# Patient Record
Sex: Male | Born: 1955
Health system: Southern US, Community
[De-identification: ages and names within clinical notes are randomized; demographics above are authoritative.]

## PROBLEM LIST (undated history)

## (undated) DIAGNOSIS — I10 Essential (primary) hypertension: Secondary | ICD-10-CM

## (undated) DIAGNOSIS — K37 Unspecified appendicitis: Secondary | ICD-10-CM

## (undated) DIAGNOSIS — K219 Gastro-esophageal reflux disease without esophagitis: Secondary | ICD-10-CM

## (undated) DIAGNOSIS — J189 Pneumonia, unspecified organism: Secondary | ICD-10-CM

## (undated) DIAGNOSIS — M1990 Unspecified osteoarthritis, unspecified site: Secondary | ICD-10-CM

## (undated) DIAGNOSIS — Z889 Allergy status to unspecified drugs, medicaments and biological substances status: Secondary | ICD-10-CM

## (undated) DIAGNOSIS — R0600 Dyspnea, unspecified: Secondary | ICD-10-CM

## (undated) DIAGNOSIS — E7800 Pure hypercholesterolemia, unspecified: Secondary | ICD-10-CM

## (undated) DIAGNOSIS — D490 Neoplasm of unspecified behavior of digestive system: Secondary | ICD-10-CM

## (undated) DIAGNOSIS — D649 Anemia, unspecified: Secondary | ICD-10-CM

## (undated) DIAGNOSIS — F32A Depression, unspecified: Secondary | ICD-10-CM

## (undated) DIAGNOSIS — K922 Gastrointestinal hemorrhage, unspecified: Secondary | ICD-10-CM

## (undated) DIAGNOSIS — F419 Anxiety disorder, unspecified: Secondary | ICD-10-CM

## (undated) DIAGNOSIS — K589 Irritable bowel syndrome without diarrhea: Secondary | ICD-10-CM

## (undated) DIAGNOSIS — G4730 Sleep apnea, unspecified: Secondary | ICD-10-CM

## (undated) DIAGNOSIS — F329 Major depressive disorder, single episode, unspecified: Secondary | ICD-10-CM

## (undated) DIAGNOSIS — Z8719 Personal history of other diseases of the digestive system: Secondary | ICD-10-CM

## (undated) HISTORY — DX: Pure hypercholesterolemia, unspecified: E78.00

## (undated) HISTORY — DX: Sleep apnea, unspecified: G47.30

## (undated) HISTORY — PX: TOTAL SHOULDER ARTHROPLASTY: SHX126

## (undated) HISTORY — PX: PROSTATE BIOPSY: SHX241

## (undated) HISTORY — PX: COLONOSCOPY: SHX174

## (undated) HISTORY — DX: Gastrointestinal hemorrhage, unspecified: K92.2

## (undated) HISTORY — DX: Essential (primary) hypertension: I10

## (undated) HISTORY — DX: Neoplasm of unspecified behavior of digestive system: D49.0

## (undated) HISTORY — PX: KNEE ARTHROPLASTY: SHX992

## (undated) HISTORY — DX: Gastro-esophageal reflux disease without esophagitis: K21.9

## (undated) HISTORY — PX: TONSILLECTOMY: SUR1361

## (undated) HISTORY — PX: GASTRECTOMY: SHX58

## (undated) HISTORY — DX: Unspecified appendicitis: K37

---

## 1997-08-02 ENCOUNTER — Emergency Department (HOSPITAL_COMMUNITY): Admission: EM | Admit: 1997-08-02 | Discharge: 1997-08-02 | Payer: Self-pay | Admitting: Emergency Medicine

## 2002-04-11 ENCOUNTER — Ambulatory Visit (HOSPITAL_COMMUNITY): Admission: RE | Admit: 2002-04-11 | Discharge: 2002-04-11 | Payer: Self-pay | Admitting: Internal Medicine

## 2002-04-11 ENCOUNTER — Encounter: Payer: Self-pay | Admitting: Internal Medicine

## 2004-02-26 ENCOUNTER — Ambulatory Visit: Payer: Self-pay | Admitting: Family Medicine

## 2004-03-10 ENCOUNTER — Ambulatory Visit: Payer: Self-pay | Admitting: Family Medicine

## 2004-03-11 ENCOUNTER — Ambulatory Visit: Payer: Self-pay | Admitting: *Deleted

## 2005-05-21 ENCOUNTER — Inpatient Hospital Stay (HOSPITAL_COMMUNITY): Admission: EM | Admit: 2005-05-21 | Discharge: 2005-05-26 | Payer: Self-pay | Admitting: Emergency Medicine

## 2007-03-01 DIAGNOSIS — D649 Anemia, unspecified: Secondary | ICD-10-CM

## 2007-03-01 HISTORY — DX: Anemia, unspecified: D64.9

## 2007-03-01 HISTORY — PX: INCISE AND DRAIN ABCESS: PRO64

## 2007-05-28 ENCOUNTER — Emergency Department (HOSPITAL_COMMUNITY): Admission: EM | Admit: 2007-05-28 | Discharge: 2007-05-28 | Payer: Self-pay | Admitting: Emergency Medicine

## 2007-11-29 ENCOUNTER — Emergency Department (HOSPITAL_COMMUNITY): Admission: EM | Admit: 2007-11-29 | Discharge: 2007-11-29 | Payer: Self-pay | Admitting: Emergency Medicine

## 2008-06-02 ENCOUNTER — Emergency Department (HOSPITAL_COMMUNITY): Admission: EM | Admit: 2008-06-02 | Discharge: 2008-06-02 | Payer: Self-pay | Admitting: Family Medicine

## 2008-11-18 ENCOUNTER — Emergency Department (HOSPITAL_COMMUNITY): Admission: EM | Admit: 2008-11-18 | Discharge: 2008-11-18 | Payer: Self-pay | Admitting: Emergency Medicine

## 2010-07-16 NOTE — H&P (Signed)
NAME:  John Keller, John Keller NO.:  [id number]   MEDICAL RECORD NO.:  [id number]          PATIENT TYPE:  INP   LOCATION:  5735                         FACILITY:  [facility]   PHYSICIAN:  Velora Heckler, MD      DATE OF BIRTH:  1955/12/22   DATE OF ADMISSION:  05/21/2005  DATE OF DISCHARGE:                                HISTORY & PHYSICAL   REFERRING PHYSICIAN:  Dr. Linwood Dibbles.   REASON FOR ADMISSION:  Cellulitis left chest wall, left axilla and left  upper extremity with full-thickness necrosis of skin and underlying multiple  abscesses.   HISTORY OF PRESENT ILLNESS:  The patient is a 55 year old black male from  [place], [place] who presents to the emergency department  accompanied by his sister.  The patient has a two-week history of  progressive cellulitis of the left chest, left axilla and left upper arm.  He was initially seen and managed at [facility].  Radiographs  were taken.  The patient was prescribed pain medication.  Symptoms worsened  and the patient presented to the emergency department for assessment.  General surgery is now called for management.   PAST MEDICAL HISTORY:  1.  Status post right hand surgery.  2.  Status post wisdom tooth extraction.   MEDICATIONS:  Pain medications as prescribed by [facility].   ALLERGIES:  None known.   SOCIAL HISTORY:  The patient is single.  He lives alone in [place].  He  does not smoke.  He does not drink alcohol.  He works for the [employer]  He is accompanied by his sister.   ALLERGIES:  None known.   REVIEW OF SYSTEMS:  A 15-system review without significant positives.   EXAM:  In general, a 55 year old well-developed, well-nourished black male  on a stretcher in the emergency department.  Temperature 102.3, pulse 118,  respirations 22, blood pressure 121/88.  HEENT shows him to be  normocephalic, atraumatic.  Sclerae clear.  Conjunctivae clear.  Pupils  equal  and reactive.  Dentition fair.  Mucous membranes moist.  The patient  has a partial plate.  Neck is supple, nontender without masses.  No  lymphadenopathy.  There are no supraclavicular masses.  There is no  tenderness.  Lungs are clear to auscultation bilaterally without rales,  rhonchi or wheeze.  Cardiac exam shows regular rate and rhythm.  Peripheral  pulses are full.  Examination of the chest wall shows a marked violaceous  erythema of the left pectoralis muscle with marked soft tissue swelling,  fluctuance and tenderness.  Skin is markedly indurated.  Axilla shows some  areas of full-thickness necrosis with purulent drainage.  There is marked  cellulitis extending from the axilla down the medial aspect of the left  upper arm.  There is full-thickness necrosis of skin overlying the left  biceps musculature.  There is induration and erythema down to the level of  the antecubital fossa.  There is limitation in range of motion of the left  upper extremity at the level of the shoulder secondary  to pain.  Cardiac  exam shows regular rate and rhythm without murmur.  Abdomen is soft,  nontender without distension.  Extremities are nontender without edema,  other than as described for the left upper extremity.  Neurologically, the  patient is alert and oriented.  Answers questions appropriately.  He has no  gross focal abnormalities.   LABORATORY STUDIES:  White count 17.9, hemoglobin 12.1, hematocrit 35.7%,  platelet count 380,000.  Differential shows 88% neutrophils, 6% lymphocytes,  5% monocytes.  Chest x-ray shows no acute disease by report.  EKG pending.   IMPRESSION:  Cellulitis with abscess and full-thickness skin necrosis  involving left chest wall, left axilla, left upper arm.   PLAN:  The patient will be admitted on the general surgical service at [facility].  He will be initiated with intravenous antibiotics.  The  patient will be taken urgently to the operating room  this evening for  debridement and drainage.  This procedure was discussed with the patient and  his sister at the bedside.  They understand and wish to proceed.      Velora Heckler, MD  Electronically Signed     TMG/MEDQ  D:  05/21/2005  T:  05/23/2005  Job:  [id number]

## 2010-07-16 NOTE — Op Note (Signed)
NAME:  John Keller, John Keller NO.:  [id number]   MEDICAL RECORD NO.:  [id number]          PATIENT TYPE:  INP   LOCATION:  1827                         FACILITY:  [facility]   PHYSICIAN:  Velora Heckler, MD      DATE OF BIRTH:  1956-01-31   DATE OF PROCEDURE:  05/21/2005  DATE OF DISCHARGE:                                 OPERATIVE REPORT   PREOPERATIVE DIAGNOSIS:  Cellulitis and abscesses left chest wall, left  axilla, left upper extremity.   POSTOPERATIVE DIAGNOSES:  Cellulitis and abscesses left chest wall, left  axilla, left upper extremity.   PROCEDURE:  1.  Incision and drainage of multiple abscesses left chest wall, left      axilla, left upper extremity with open packing.  2.  Full-thickness debridement of skin (3 x 5 cm left upper extremity, 2 x 2      cm left deltoid, 2 x 3 cm left axilla, 1 x 2 cm left chest wall).   SURGEON:  Velora Heckler, MD, FACS   ANESTHESIA:  General per Dr. Sharee Holster   ESTIMATED BLOOD LOSS:  200 ml.   PREPARATION:  Betadine.   COMPLICATIONS:  None.   INDICATIONS:  The patient is a 55 year old male presents to the emergency  department with 2-week history of progressive cellulitis, pain, swelling in  the left chest, left axilla, left upper extremity.  The patient was seen in  the emergency department.  He was noted to have an elevated white blood cell  count with a left shift.  He was febrile.  On exam he had necrotic skin  involving the left upper extremity, left axilla, left chest wall with a  large areas of fluctuance and induration and erythema.  The patient is now  brought to the operating room for drainage and debridement.   BODY OF REPORT:  The procedure was done in OR #[id number] at [facility].  The patient is brought to the operating room, placed in supine  position on the operating room table.  Following administration of general  anesthesia,  the patient is placed with the left arm out on an arm  board.  Chest wall, axilla and left upper extremity are prepped and draped in usual  strict aseptic fashion.  Using the electrocautery on cutting setting, the  necrotic skin from the upper left arm was debrided.  This measures 3 x 5 cm.  It is debrided full-thickness into the subcutaneous tissues.  A large  copious amount of green brown, purulent fluid which was quite thick  containing a large amount of debris is evacuated.  Aerobic and anaerobic  cultures were submitted to the laboratory.  Cavity is debrided down the  medial aspect of the left arm into the deltoid region of the left shoulder  and into the left axilla proper.  The area connects with other areas of  necrotic skin.  A 2 x 2 cm skin was debrided over the left deltoid.  A 2 x 3  cm area is debrided in the left axilla.  A 1 x 2 cm area was debrided off  the left chest wall.  Underlying abscess cavities were very extensive.  They  all communicate.  They tract both and behind and the anterior to the  pectoralis major muscles on the left chest wall.  Tissues were extremely  friable and there was moderate blood loss simply from drainage and  debridement.  No surgical bleeding is identified.  Wounds irrigated and  cleansed.  There are then packed with vaginal packing soaked with Betadine.  Three complete vaginal packs were placed in all wounds.  Good hemostasis was  noted.  4x4s, followed by ABD pads were secured with Hypafix tape for  absorbent dressings.  The patient is awakened from anesthesia and brought to  the recovery room in stable condition.  The patient tolerated the procedure  well.  The patient will require returned to the operating room for dressing  changes and further debridement and drainage.      Velora Heckler, MD  Electronically Signed     TMG/MEDQ  D:  05/21/2005  T:  05/24/2005  Job:  [id number]

## 2010-07-16 NOTE — Discharge Summary (Signed)
NAME:  GARRUS, GAUTHREAUX NO.:  [id number]   MEDICAL RECORD NO.:  [id number]          PATIENT TYPE:  INP   LOCATION:  5741                         FACILITY:  [facility]   PHYSICIAN:  Leonie Man, M.D.   DATE OF BIRTH:  1955/09/16   DATE OF ADMISSION:  05/21/2005  DATE OF DISCHARGE:  05/26/2005                                 DISCHARGE SUMMARY   ADDENDUM:  This is regarding the wound care.  In addition to packing with wet-to-dry  dressing as prescribed, prior to this being done the patient will have all  wounds irrigated with 1/3 peroxide to 2/3 normal saline with each dressing  change.      Allison L. Rennis Harding, N.P.      Leonie Man, M.D.  Electronically Signed    ALE/MEDQ  D:  05/26/2005  T:  05/27/2005  Job:  [id number]

## 2010-07-16 NOTE — Discharge Summary (Signed)
NAME:  John Keller, John Keller NO.:  [id number]   MEDICAL RECORD NO.:  [id number]          PATIENT TYPE:  INP   LOCATION:  5741                         FACILITY:  [facility]   PHYSICIAN:  Leonie Man, M.D.   DATE OF BIRTH:  03-Oct-1955   DATE OF ADMISSION:  05/21/2005  DATE OF DISCHARGE:  05/26/2005                                 DISCHARGE SUMMARY   ADMITTING PHYSICIAN:  Velora Heckler, M.D.   DISCHARGING PHYSICIAN:  Leonie Man, M.D.   CHIEF COMPLAINT AND REASON FOR ADMISSION:  Mr. Bourdon is a 55 year old male  patient who was brought to the emergency department by his sister.  He has a  two-week history of progressive cellulitis of the left chest, left axilla  and left upper arm.  He was initially managed through [facility].  X-rays were taken, and the patient was given pain medication.  Because of progression of symptoms, the patient presented to the ER for  assessment, and surgery has been called in to assist with management of  severe cellulitis with multiple skin abscesses including full-thickness  necrosis.   On exam, the patient was febrile with a temperature of 102.3.  Pulse was  118.  BP was stable and respirations were stable.  There was significant  soft tissue swelling with violaceous erythema of the left pectoralis region  with fluctuance and tenderness and induration.  Similar appearance to the  axilla with cellulitis extending down to the axilla and the upper left arm  with full-thickness necrosis of the skin overlying the left biceps  musculature.  Please refer to Dr. Ardine Eng admission history and physical  exam of the extremity for details.   The patient's admission white count was 17,900, neutrophils were 88%,  lymphocytes 6%, hemoglobin 12.1, hematocrit 35.7.  Chest x-ray showed no  acute process.  Initial EKG showed sinus tachycardia, right axis, otherwise  no acute ischemic changes.   ADMITTING DIAGNOSIS:  Cellulitis of the left  chest wall, axilla and left  upper extremity with full-thickness necrosis of skin and underlying multiple  abscesses.   HOSPITAL COURSE:  The patient was admitted on May 21, 2005 by Dr. Gerrit Friends  as noted.  He was also taken to the OR on the same day where he underwent  incision and debridement of multiple abscesses of the left chest wall, left  axilla and left upper extremity with open packing as well as full-thickness  debridement of skin in same areas.  Please refer to Dr. Ardine Eng operative  note for additional details.  The patient was started on vancomycin and  Primaxin and sent to the floor for recovery.   On postop day one the dressings were saturated with purulent drainage,  especially in the chest region.  White count was up to 19,000.  Temperature  max was 101.4.  He was continued on IV antibiotics and dressing changes were  continued.  On postop day two, the patient had pain in the left arm and  axilla with movement.  Continued with erythema.  White count was up to  19,600, hemoglobin  down to 10.  Primaxin and vancomycin were continued.  Wounds were changed.  Bases of the wounds were pink and granular.  Still  purulent drainage, especially from the mid chest as well as fibrin debris.  By postop day three, the patient's white count had decreased to 10,600,  hemoglobin 10.7.  Other labs were stable.  He was afebrile.  Wound care was  provided per myself.  A large amount of fibrin debris manually removed from  axilla wound, and thick brown-yellow purulent drainage and fibrin debris was  noted to be coming from the chest incision.  Again wound was repacked and  b.i.d. dressing changes were continued.   Subsequent wound cultures were positive for microaerophilic strep.  Antibiotics were changed to Augmentin.  By postop day five, the patient was  deemed appropriate for discharge home.  His blood cultures were negative.  At time of dictation, wound care has yet to be performed from  myself and Dr.  Lurene Shadow.  The patient is still having a significant amount of fibrinous and  purulent drainage coming from the chest wound but overall has improved.   In addition to the noted cellulitic and abscess changes for admission, the  patient was found to be somewhat anemic with hemoglobin in the range of 10  since admission.  Ferritin is elevated at 464.  Iron is normal at 58 with a  percent sat of 34.  TIBC is low at 171.  Fecal occult blood is negative.  MMA and RBC folate are pending at time of dictation.  Patient denies any  prior history of anemia.  I discussed this with the patient and informed him  that he needed to follow up with a family medical doctor in the next week or  two to discuss the anemia.   FINAL DISCHARGE DIAGNOSES:  1.  Cellulitis and abscesses of the left chest wall, left axilla and left      upper extremity.  2.  Status post incision and drainage of multiple abscesses of left chest      wall, left axilla and left upper extremity with open packing with full-      thickness debridement of skin in these same areas.  3.  New anemia.  4.  Leukocytosis, resolved.   DISCHARGE MEDICATIONS:  1.  Augmentin 875 mg b.i.d.  2.  Percocet 5/325 one to two every four hours as needed for pain.   Return to work after followup with Dr. Gerrit Friends.  He will tell the patient  when it is okay to return to work.   DIET:  No restrictions.   ACTIVITY:  Increase activity slowly.  May shower.  No driving for two weeks.  No lifting for four weeks.   WOUND CARE:  Will have home health RN come to the house to assist with  dressing changes.  This will be normal saline gauze, pack open areas of  wounds, cover with dry dressings.  This is to be done twice daily.   FOLLOWUP APPOINTMENTS:  1.  Again, the patient has been instructed to follow up with his family      doctor regarding the anemia or low blood count.  He needs to call to be      seen in one week. 2.  He is to follow up  with Dr. Gerrit Friends in the office on Monday, June 13, 2005 at 2:30 p.m.      Allison L. Rennis Harding, N.P.  Leonie Man, M.D.  Electronically Signed    ALE/MEDQ  D:  05/26/2005  T:  05/27/2005  Job:  [id number]   cc:   Velora Heckler, MD  [address] [place]  [place] [address]

## 2011-03-01 DIAGNOSIS — K922 Gastrointestinal hemorrhage, unspecified: Secondary | ICD-10-CM

## 2011-03-01 DIAGNOSIS — D490 Neoplasm of unspecified behavior of digestive system: Secondary | ICD-10-CM

## 2011-03-01 HISTORY — DX: Gastrointestinal hemorrhage, unspecified: K92.2

## 2011-03-01 HISTORY — DX: Neoplasm of unspecified behavior of digestive system: D49.0

## 2011-05-04 ENCOUNTER — Inpatient Hospital Stay (HOSPITAL_COMMUNITY)
Admission: EM | Admit: 2011-05-04 | Discharge: 2011-05-15 | DRG: 327 | Disposition: A | Discharge status: Home / Self Care | Payer: Self-pay | Source: Ambulatory Visit | Attending: Surgery | Admitting: Surgery

## 2011-05-04 ENCOUNTER — Other Ambulatory Visit: Payer: Self-pay

## 2011-05-04 ENCOUNTER — Emergency Department (HOSPITAL_COMMUNITY): Payer: Self-pay

## 2011-05-04 ENCOUNTER — Encounter (HOSPITAL_COMMUNITY): Payer: Self-pay

## 2011-05-04 DIAGNOSIS — K922 Gastrointestinal hemorrhage, unspecified: Secondary | ICD-10-CM | POA: Diagnosis present

## 2011-05-04 DIAGNOSIS — D62 Acute posthemorrhagic anemia: Secondary | ICD-10-CM | POA: Diagnosis present

## 2011-05-04 DIAGNOSIS — R195 Other fecal abnormalities: Secondary | ICD-10-CM

## 2011-05-04 DIAGNOSIS — J069 Acute upper respiratory infection, unspecified: Secondary | ICD-10-CM

## 2011-05-04 DIAGNOSIS — R9431 Abnormal electrocardiogram [ECG] [EKG]: Secondary | ICD-10-CM | POA: Diagnosis present

## 2011-05-04 DIAGNOSIS — R270 Ataxia, unspecified: Secondary | ICD-10-CM

## 2011-05-04 DIAGNOSIS — I214 Non-ST elevation (NSTEMI) myocardial infarction: Secondary | ICD-10-CM

## 2011-05-04 DIAGNOSIS — Z7982 Long term (current) use of aspirin: Secondary | ICD-10-CM

## 2011-05-04 DIAGNOSIS — D131 Benign neoplasm of stomach: Principal | ICD-10-CM | POA: Diagnosis present

## 2011-05-04 DIAGNOSIS — E876 Hypokalemia: Secondary | ICD-10-CM | POA: Diagnosis not present

## 2011-05-04 DIAGNOSIS — D72829 Elevated white blood cell count, unspecified: Secondary | ICD-10-CM | POA: Diagnosis present

## 2011-05-04 DIAGNOSIS — R000 Tachycardia, unspecified: Secondary | ICD-10-CM | POA: Diagnosis present

## 2011-05-04 DIAGNOSIS — R030 Elevated blood-pressure reading, without diagnosis of hypertension: Secondary | ICD-10-CM | POA: Diagnosis present

## 2011-05-04 DIAGNOSIS — I959 Hypotension, unspecified: Secondary | ICD-10-CM | POA: Diagnosis present

## 2011-05-04 DIAGNOSIS — K3189 Other diseases of stomach and duodenum: Secondary | ICD-10-CM

## 2011-05-04 HISTORY — DX: Anemia, unspecified: D64.9

## 2011-05-04 HISTORY — DX: Major depressive disorder, single episode, unspecified: F32.9

## 2011-05-04 HISTORY — DX: Depression, unspecified: F32.A

## 2011-05-04 LAB — URINALYSIS, ROUTINE W REFLEX MICROSCOPIC
Leukocytes, UA: NEGATIVE
Nitrite: NEGATIVE
Protein, ur: NEGATIVE mg/dL
Specific Gravity, Urine: 1.024 (ref 1.005–1.030)
Urobilinogen, UA: 0.2 mg/dL (ref 0.0–1.0)

## 2011-05-04 LAB — CARDIAC PANEL(CRET KIN+CKTOT+MB+TROPI): Troponin I: <0.3 ng/mL (ref <0.30)

## 2011-05-04 LAB — BASIC METABOLIC PANEL
CO2: 26 mEq/L (ref 19–32)
Calcium: 8.6 mg/dL (ref 8.4–10.5)
GFR calc non Af Amer: 74 mL/min — ABNORMAL LOW (ref >90)
Glucose, Bld: 100 mg/dL — ABNORMAL HIGH (ref 70–99)
Potassium: 4.3 mEq/L (ref 3.5–5.1)
Sodium: 141 mEq/L (ref 135–145)

## 2011-05-04 LAB — CBC
HCT: 29.1 % — ABNORMAL LOW (ref 39.0–52.0)
Hemoglobin: 9.8 g/dL — ABNORMAL LOW (ref 13.0–17.0)
MCH: 29.7 pg (ref 26.0–34.0)
MCHC: 33.7 g/dL (ref 30.0–36.0)
MCV: 88.2 fL (ref 78.0–100.0)
Platelets: 184 10*3/uL (ref 150–400)
RBC: 3.3 MIL/uL — ABNORMAL LOW (ref 4.22–5.81)
RDW: 14.2 % (ref 11.5–15.5)
WBC: 15.7 10*3/uL — ABNORMAL HIGH (ref 4.0–10.5)

## 2011-05-04 LAB — POCT I-STAT TROPONIN I: Troponin i, poc: 0.16 ng/mL (ref 0.00–0.08)

## 2011-05-04 LAB — PREPARE RBC (CROSSMATCH)

## 2011-05-04 LAB — OCCULT BLOOD, POC DEVICE: Fecal Occult Bld: POSITIVE

## 2011-05-04 MED ORDER — ACETAMINOPHEN 325 MG PO TABS: 650.0000 mg | ORAL_TABLET | Freq: Four times a day (QID) | ORAL | Status: DC | PRN

## 2011-05-04 MED ORDER — ASPIRIN 81 MG PO CHEW
324.0000 mg | CHEWABLE_TABLET | Freq: Once | ORAL | Status: AC
  Administered 2011-05-04: 324 mg via ORAL
  Filled 2011-05-04: qty 4

## 2011-05-04 MED ORDER — SODIUM CHLORIDE 0.9 % IV SOLN
INTRAVENOUS | Status: DC
  Administered 2011-05-05 – 2011-05-07 (×4): via INTRAVENOUS
  Administered 2011-05-07: 1000 mL via INTRAVENOUS
  Administered 2011-05-08 – 2011-05-10 (×6): via INTRAVENOUS

## 2011-05-04 MED ORDER — SODIUM CHLORIDE 0.9 % IJ SOLN
3.0000 mL | Freq: Two times a day (BID) | INTRAMUSCULAR | Status: DC
  Administered 2011-05-05 – 2011-05-06 (×3): 3 mL via INTRAVENOUS

## 2011-05-04 MED ORDER — SODIUM CHLORIDE 0.9 % IV SOLN
8.0000 mg/h | INTRAVENOUS | Status: DC
  Administered 2011-05-05: 8 mg/h via INTRAVENOUS
  Filled 2011-05-04 (×4): qty 80

## 2011-05-04 MED ORDER — ONDANSETRON HCL 4 MG/2ML IJ SOLN: 4.0000 mg | Freq: Four times a day (QID) | INTRAMUSCULAR | Status: DC | PRN

## 2011-05-04 MED ORDER — ONDANSETRON HCL 4 MG PO TABS: 4.0000 mg | ORAL_TABLET | Freq: Four times a day (QID) | ORAL | Status: DC | PRN

## 2011-05-04 MED ORDER — ACETAMINOPHEN 650 MG RE SUPP: 650.0000 mg | Freq: Four times a day (QID) | RECTAL | Status: DC | PRN

## 2011-05-04 MED ORDER — SODIUM CHLORIDE 0.9 % IV SOLN
80.0000 mg | Freq: Once | INTRAVENOUS | Status: AC
  Administered 2011-05-05: 80 mg via INTRAVENOUS
  Filled 2011-05-04: qty 80

## 2011-05-04 MED ORDER — MECLIZINE HCL 25 MG PO TABS
25.0000 mg | ORAL_TABLET | Freq: Once | ORAL | Status: AC
  Administered 2011-05-04: 25 mg via ORAL
  Filled 2011-05-04: qty 1

## 2011-05-04 NOTE — ED Provider Notes (Addendum)
History     CSN: [id number]  Arrival date & time 05/04/11  1406   First MD Initiated Contact with Patient 05/04/11 1418      Chief Complaint  Patient presents with  . Chest Pain    (Consider location/radiation/quality/duration/timing/severity/associated sxs/prior treatment) HPI History provided by pt.  Pt presents w/ two separate complaints.  Has had a cough for the past two weeks w/ associated mild SOB.  Had nasal congestion, rhinorrhea and sore throat initially but these sx have resolved.  Has chronic, intermittent pain at left anterior chest and left axilla where he had skin lesions surgically removed in 2006.  This pain is stable and has otherwise not had CP.  No RF for PE.  No personal or FH of MI.  Does not smoke cigarettes.  Also c/o bilateral LE weakness since approx 2:30am today.  Woke to use restroom, legs gave out on him and he fell to floor. Attempted to get up several times but legs would not hold him.  Had room-spinning dizziness at the same time.  Was incontinent of urine while he slept on floor.  Was able to walk at 7:30am, though legs continued to feel weak and dizziness persisted, and noticed at that time that he had blurred vision as well.  These sx have been constant throughout day.  Has never had these sx in the past.  Denies recent head injury.  Denies dysarthria, dysphagia, confusion and extremity paresthesias.   Denies low back pain.     Past Medical History  Diagnosis Date  . Depression     History reviewed. No pertinent past surgical history.  No family history on file.  History  Substance Use Topics  . Smoking status: Never Smoker   . Smokeless tobacco: Not on file  . Alcohol Use: No      Review of Systems  All other systems reviewed and are negative.    Allergies  Review of patient's allergies indicates not on file.  Home Medications  No current outpatient prescriptions on file.  BP 117/70  Pulse 110  Temp(Src) 98.2 F (36.8 C) (Oral)   Resp 18  Ht 5\' 10"  (1.778 m)  Wt 275 lb (124.739 kg)  BMI 39.46 kg/m2  SpO2 100%  Physical Exam  Nursing note and vitals reviewed. Constitutional: He is oriented to person, place, and time. He appears well-developed and well-nourished. No distress.  HENT:  Head: Normocephalic.  Mouth/Throat: Oropharynx is clear and moist.  Eyes:       Normal appearance  Neck: Normal range of motion.  Cardiovascular: Normal rate and regular rhythm.   Pulmonary/Chest: Effort normal and breath sounds normal.       2cm surgical scar inferior to left breast as well as just medial to left axilla.  No cellulitis.  Entire chest non-tender.   Abdominal: Soft. Bowel sounds are normal. He exhibits no distension. There is no tenderness.  Musculoskeletal:       No peripheral edema or calf tenderness.   Neurological: He is alert and oriented to person, place, and time. No cranial nerve deficit or sensory deficit. Coordination normal.       5/5 and equal upper and lower extremity strength.  No past pointing.  Nml romberg.  Pt slightly ataxic and reports that he feels he is going to fall forward.  No nystagmus.  Possible patellar hyporeflexia.   Skin: Skin is warm and dry. No rash noted.  Psychiatric: He has a normal mood and affect. His behavior is  normal.    ED Course  Procedures (including critical care time)  Labs Reviewed  CBC - Abnormal; Notable for the following:    WBC 15.7 (*)    RBC 3.30 (*)    Hemoglobin 9.8 (*)    HCT 29.1 (*)    All other components within normal limits  BASIC METABOLIC PANEL - Abnormal; Notable for the following:    Glucose, Bld 100 (*)    BUN 54 (*)    GFR calc non Af Amer 74 (*)    GFR calc Af Amer 85 (*)    All other components within normal limits  POCT I-STAT TROPONIN I - Abnormal; Notable for the following:    Troponin i, poc 0.16 (*)    All other components within normal limits    Date: 05/04/2011  Rate: 92  Rhythm: normal sinus rhythm  QRS Axis: right   Intervals: normal  ST/T Wave abnormalities: nonspecific T wave changes  Conduction Disutrbances:none  Narrative Interpretation:   Old EKG Reviewed: changes noted (t wave inversion inferiorly)   Mr Brain Wo Contrast  05/04/2011  *RADIOLOGY REPORT*  Clinical Data: Ataxia.  MRI HEAD WITHOUT CONTRAST  Technique:  Multiplanar, multiecho pulse sequences of the brain and surrounding structures were obtained according to standard protocol without intravenous contrast.  Comparison: None.  Findings: No acute infarct.  No intracranial hemorrhage.  No intracranial mass lesion detected on this unenhanced exam.  Mild atrophy most notable parietal lobe.  No hydrocephalus.  Major intracranial vascular structures are patent.  Cervical medullary junction, pituitary region and pineal region unremarkable.  Mild exophthalmos.  Minimal mucosal thickening ethmoid sinus air cells.  IMPRESSION: No acute infarct.  Mild atrophy most notable parietal lobe.  Original Report Authenticated By: Fuller Canada, M.D.     1. Ataxia   2. Viral URI   3. NSTEMI (non-ST elevated myocardial infarction)       MDM  Pt presents w/ URI sx + SOB x 2 weeks, chronic and stable CP attributed to surgical excision of skin lesions, dizziness/blurred vision/ataxia and bilateral LE weakness.  On exam, afebrile, lungs clear, chest non-tender, no LE tenderness/edema, 5/5 and equal LE strength, mild ataxia, nml romberg and finger-nose.  CXR neg for pneumonia. EKG shows new T wave inversions inferior leads.  Troponin checked d/t c/o SOB and elevated at 0.16.  Labs also sig for leukocytosis.  MRI brain pending.  Pt has been moved to CDU.  Will admit to hospital when it results for possible N-STEMI as well as decreased ability ambulate.  Pt has received aspirin.  Pt scene by Dr. Lynelle Doctor and Dr. Freida Busman is aware of him.         Otilio Miu, PA 05/04/11 1643  Otilio Miu, [place] 05/04/11 2122

## 2011-05-04 NOTE — ED Notes (Addendum)
Pt states that chest pain is localized to area where he had surgery and "comes and goes for a while now since the surgery." Pt states that his main complaint is dizziness and "having trouble standing the past couple days." Pt states he tried getting out of bed last night and fell to the floor. States he was incontinent of urine and didn't realize it. Pt states "I don't know if I passed out or just fell asleep."

## 2011-05-04 NOTE — ED Provider Notes (Signed)
Spoke with cardiology and he'll come see the patient and evaluate for admission  Toy Baker, MD 05/04/11 1747

## 2011-05-04 NOTE — Consult Note (Signed)
[facility]       CONSULTATION NOTE   Reason for Consult: Abnormal cardiac enzymes and abnormal ecg  Requesting Physician: ALLEN/VIYOUH  HPI: This is a 56 y.o. male with a past medical history significant for the absence of known cardiac illness or of coronary risk factors other than family history of non-premature CAD (no DM, HTN, smoking or hyperlipidemia).  He presented with severe weakness and dizziness: he spent the whole night on the floor. He did not experience syncope but was too weak to get up. Denies any chest pain dyspnea or palpitations. No focal neurological symptoms. No edema, orthopnea or hemoptysis or cough. Has had a "cold" for several days. Thinks he is exhausted from working two jobs.  He was mildly hypotensive and tachycardic. Moderate anemia and moderate leukocytosis, markedly elevated BUN with normal creatinine. Inferior T wave changes are seen without ST elevation or depression. POC cTnI is marginally elevated.  PMHx:  Past Medical History  Diagnosis Date  . Depression    History reviewed. No pertinent past surgical history.  FAMHx: No family history on file.  SOCHx:  reports that he has never smoked. He does not have any smokeless tobacco history on file. He reports that he does not drink alcohol or use illicit drugs.  ALLERGIES: No Known Allergies  ROS: Constitutional: positive for fatigue, malaise, sweats and weight loss, negative for anorexia and fevers Eyes: negative Ears, nose, mouth, throat, and face: positive for nasal congestion and sore throat Respiratory: negative for cough, hemoptysis, stridor and wheezing Cardiovascular: negative for chest pain, claudication, dyspnea, irregular heart beat, orthopnea, palpitations, paroxysmal nocturnal dyspnea and syncope Gastrointestinal: negative for abdominal pain, diarrhea, melena, nausea and vomiting Genitourinary:negative Integument/breast:  negative Hematologic/lymphatic: positive for absence of bleeding Musculoskeletal:positive for muscle weakness and myalgias Neurological: negative Behavioral/Psych: positive for depression Endocrine: negative Allergic/Immunologic: negative  HOME MEDICATIONS:  (Not in a hospital admission)  HOSPITAL MEDICATIONS: Prior to Admission:  (Not in a hospital admission)  VITALS: Blood pressure 116/70, pulse 110, temperature 98.8 F (37.1 C), temperature source Oral, resp. rate 20, height 5\' 10"  (1.778 m), weight 124.739 kg (275 lb), SpO2 100.00%.  PHYSICAL EXAM: General appearance: alert Neck: no adenopathy, no carotid bruit, no JVD, supple, symmetrical, trachea midline and thyroid not enlarged, symmetric, no tenderness/mass/nodules Lungs: clear to auscultation bilaterally Heart: normal apical impulse, regular rate and rhythm, S1, S2 normal and S4 present Abdomen: soft, non-tender; bowel sounds normal; no masses,  no organomegaly Extremities: extremities normal, atraumatic, no cyanosis or edema Pulses: 2+ and symmetric Skin: Skin color, texture, turgor normal. No rashes or lesions or marked pallor Neurologic: Alert and oriented X 3, normal strength and tone. Normal symmetric reflexes. Normal coordination and gait  LABS: Results for orders placed during the hospital encounter of 05/04/11 (from the past 48 hour(s))  CBC     Status: Abnormal   Collection Time   05/04/11  2:55 PM      Component Value Range Comment   WBC 15.7 (*) 4.0 - 10.5 (K/uL)    RBC 3.30 (*) 4.22 - 5.81 (MIL/uL)    Hemoglobin 9.8 (*) 13.0 - 17.0 (g/dL)    HCT 45.4 (*) 09.8 - 52.0 (%)    MCV 88.2  78.0 - 100.0 (fL)    MCH 29.7  26.0 - 34.0 (pg)    MCHC 33.7  30.0 - 36.0 (g/dL)    RDW 11.9  14.7 - 82.9 (%)    Platelets 184  150 - 400 (K/uL)   BASIC METABOLIC PANEL     Status: Abnormal   Collection Time   05/04/11  2:55 PM      Component Value Range Comment   Sodium 141  135 - 145 (mEq/L)    Potassium 4.3  3.5 - 5.1  (mEq/L)    Chloride 110  96 - 112 (mEq/L)    CO2 26  19 - 32 (mEq/L)    Glucose, Bld 100 (*) 70 - 99 (mg/dL)    BUN 54 (*) 6 - 23 (mg/dL)    Creatinine, Ser 1.61  0.50 - 1.35 (mg/dL)    Calcium 8.6  8.4 - 10.5 (mg/dL)    GFR calc non Af Amer 74 (*) >90 (mL/min)    GFR calc Af Amer 85 (*) >90 (mL/min)   POCT I-STAT TROPONIN I     Status: Abnormal   Collection Time   05/04/11  3:07 PM      Component Value Range Comment   Troponin i, poc 0.16 (*) 0.00 - 0.08 (ng/mL)    Comment NOTIFIED PHYSICIAN      Comment 3              IMAGING: Dg Chest 2 View  05/04/2011  *RADIOLOGY REPORT*  Clinical Data: Shortness of breath.  Recent upper respiratory tract infection.  CHEST - 2 VIEW  Comparison: Portal chest 05/21/2005.  Findings: The heart is mildly enlarged.  The lungs are clear.  Mild degenerative changes are noted in the thoracic spine.  The visualized soft tissues and bony thorax are unremarkable.  IMPRESSION: Borderline cardiomegaly without failure.  Original Report Authenticated By: Jamesetta Orleans. MATTERN, M.D.   Mr Brain Wo Contrast  05/04/2011  *RADIOLOGY REPORT*  Clinical Data: Ataxia.  MRI HEAD WITHOUT CONTRAST  Technique:  Multiplanar, multiecho pulse sequences of the brain and surrounding structures were obtained according to standard protocol without intravenous contrast.  Comparison: None.  Findings: No acute infarct.  No intracranial hemorrhage.  No intracranial mass lesion detected on this unenhanced exam.  Mild atrophy most notable parietal lobe.  No hydrocephalus.  Major intracranial vascular structures are patent.  Cervical medullary junction, pituitary region and pineal region unremarkable.  Mild exophthalmos.  Minimal mucosal thickening ethmoid sinus air cells.  IMPRESSION: No acute infarct.  Mild atrophy most notable parietal lobe.  Original Report Authenticated By: Fuller Canada, M.D.    IMPRESSION: 1. Overall constellation of anemia, elevated WBC and elevated BUN, hypotension and  tachycardia are most consistent with recent GI bleeding. He still appears hypovolemic and I suspect that his true hemoglobin is even lower and will drop after full rehydration. 2. The presentation is highly atypical for a true acute coronary syndrome, although further workup is definitely warranted. Recommend full cardiac enzyme panel and echo first.  3. If regional wall motion abnormalities or clear enzyme abnormalities are seen, coronary angiography is acceptable next step (once renal function improved). Revascularization procedures should be delayed until anemia evaluation is completed to avoid worsened risk of bleeding from antiplatelet agents. 4. If enzymes not consistent with ACS and no regional echo abnormalities, recommend nuclear stress testing.  RECOMMENDATION: 1. Admit. IVF. ECHO. Repeat enzymes and echo  Time Spent Directly with Patient: 30 minutes  Thurmon Fair, MD, [facility] [phone] Jamayia Croker 05/04/2011, 6:15 PM

## 2011-05-04 NOTE — ED Notes (Signed)
Patient transported to MRI 

## 2011-05-04 NOTE — ED Notes (Signed)
EKG done in triage

## 2011-05-04 NOTE — H&P (Signed)
PCP:   No primary provider on file.   Chief Complaint:  Dizziness, weakness  HPI: 55yoM with no major history presents with likely UGIB.   Pt can relate history well, states that for the past couple weeks he's had cold and congestions  symptoms, for which he was taking various OTC meds, including Aleve a couple times a day for the  past couple weeks. Last week he endorses having a "chocolate brownie" colored stool that he  thought was due to his food intake, but denies any abdominal pain, no epigastric burning, or  chronic nausea/vomiting. Yesterday after work he came home and laid on the couch due to feeling  poorly, and around 2a got up and fell to the floor due to weakness and dizziness, but is not sure  if he had LOC. He was unable to get up on his own until 7a today, then went to urgent care where  blood work was drawn and was sent to the ED.   In the ED, pt was tachy to 110, and BP 98/60 - 117/70. Labs with renal 54/1.11, negative cardiac  enzymes despite positive Trop POC, WBC 15.7, Hct 29.1. CXR with borderline cardiomegaly without  failure. MRI brain (done for dizziness) was negative except mild atrophy in parietal lobe. Fecal  occult blood positive. UA negative. Pt has been given ASA 325, antivert.   Pt was initially seen by cardiology due to increased POC troponin, who recommended repeat enzymes  and echo, and repeat cardiac enzymes were negative. They correctly felt that GIB could be possible  and to work this up, and admit to medicine.   Pt states he had remote colonoscopy which showed polyps and was NOT done due to GIB, as he's never  had this problem before. He denies any alcohol intake at all, and doesn't smoke cigarettes. He  denies any SOB, current nausea, vomiting, abd pain although it is very minimally tender to  palpation. He never had any BRBPR. He denies chest pain. ROS otherwise negative.    Past Medical History  Diagnosis Date  . Depression     History  reviewed. No pertinent past surgical history.  Medications:  HOME MEDS:  Was not taking any daily meds  Prior to Admission medications   Medication Sig Start Date End Date Taking? Authorizing Provider  aspirin-acetaminophen-caffeine (EXCEDRIN EXTRA STRENGTH) [phone] MG per tablet Take 1-2 tablets by mouth every 6 (six) hours as needed. For pain   Yes Historical Provider, MD  Codeine-Chlorpheniramine-APAP (COTABFLU PO) Take 1-2 tablets by mouth daily.   Yes Historical Provider, MD  dimenhyDRINATE (DRAMAMINE) 50 MG tablet Take 50 mg by mouth every 8 (eight) hours as needed. For dizzieness   Yes Historical Provider, MD  hydroxypropyl methylcellulose (ISOPTO TEARS) 2.5 % ophthalmic solution Place 1 drop into both eyes 4 (four) times daily as needed. For dry eyes   Yes Historical Provider, MD  Multiple Vitamin (MULITIVITAMIN WITH MINERALS) TABS Take 1 tablet by mouth daily.   Yes Historical Provider, MD  naproxen (NAPROSYN) 250 MG tablet Take 250-500 mg by mouth 2 (two) times daily with a meal. For pain   Yes Historical Provider, MD    Allergies:  No Known Allergies  Social History:   reports that he has never smoked. He does not have any smokeless tobacco history on file. He reports that he does not drink alcohol or use illicit drugs.   Family History: No family history on file.  Physical Exam: Filed Vitals:   05/04/11  1623 05/04/11 1700 05/04/11 1800 05/04/11 1900  BP: 116/70 103/70 98/60 102/72  Pulse:  101 108 95  Temp:      TempSrc:      Resp: 20 16 19    Height:      Weight:      SpO2: 100% 100% 100% 100%   Blood pressure 102/72, pulse 95, temperature 98.8 F (37.1 C), temperature source Oral, resp. rate 19, height 5\' 10"  (1.778 m), weight 124.739 kg (275 lb), SpO2 100.00%.  Gen: Large but not overtly obese M in no distress, question very minimal cognitive defect vs some  stuttering, but overall pt able to give history well and appears without distress. Breathing    comfortably, minimally more dizzy when sat up HEENT: Pupils round and reactive, EOMI, sclera clear, conjunctivae are not pale. Mouth moist,  normal appearing, no gross lesions Lungs: CTAB no w/c/r, normal air movement, overall normal Heart: Regular, minimally tachycardic without gross m/g Abd: Minimally distended but soft, not rigid or peritoneal, minimal facial grimacing and  subjective tenderness epigastrically Extrem: Warm, perfusing well, not cold or cyanotic, radials palpable, no BLE edema noted Neuro: Alert, attentive, conversant, stutters and has some minimal word finding dificulty but  overall still normal, CN 2-12 intact, moves extremities on his own, grossly overall non focal.   Labs & Imaging Results for orders placed during the hospital encounter of 05/04/11 (from the past 48 hour(s))  CBC     Status: Abnormal   Collection Time   05/04/11  2:55 PM      Component Value Range Comment   WBC 15.7 (*) 4.0 - 10.5 (K/uL)    RBC 3.30 (*) 4.22 - 5.81 (MIL/uL)    Hemoglobin 9.8 (*) 13.0 - 17.0 (g/dL)    HCT 95.6 (*) 21.3 - 52.0 (%)    MCV 88.2  78.0 - 100.0 (fL)    MCH 29.7  26.0 - 34.0 (pg)    MCHC 33.7  30.0 - 36.0 (g/dL)    RDW 08.6  57.8 - 46.9 (%)    Platelets 184  150 - 400 (K/uL)   BASIC METABOLIC PANEL     Status: Abnormal   Collection Time   05/04/11  2:55 PM      Component Value Range Comment   Sodium 141  135 - 145 (mEq/L)    Potassium 4.3  3.5 - 5.1 (mEq/L)    Chloride 110  96 - 112 (mEq/L)    CO2 26  19 - 32 (mEq/L)    Glucose, Bld 100 (*) 70 - 99 (mg/dL)    BUN 54 (*) 6 - 23 (mg/dL)    Creatinine, Ser 6.29  0.50 - 1.35 (mg/dL)    Calcium 8.6  8.4 - 10.5 (mg/dL)    GFR calc non Af Amer 74 (*) >90 (mL/min)    GFR calc Af Amer 85 (*) >90 (mL/min)   POCT I-STAT TROPONIN I     Status: Abnormal   Collection Time   05/04/11  3:07 PM      Component Value Range Comment   Troponin i, poc 0.16 (*) 0.00 - 0.08 (ng/mL)    Comment NOTIFIED PHYSICIAN      Comment 3             CARDIAC PANEL(CRET KIN+CKTOT+MB+TROPI)     Status: Normal   Collection Time   05/04/11  6:13 PM      Component Value Range Comment   Total CK 62  7 - 232 (U/L)  CK, MB 1.9  0.3 - 4.0 (ng/mL)    Troponin I <0.30  <0.30 (ng/mL)    Relative Index RELATIVE INDEX IS INVALID  0.0 - 2.5    URINALYSIS, ROUTINE W REFLEX MICROSCOPIC     Status: Abnormal   Collection Time   05/04/11  7:30 PM      Component Value Range Comment   Color, Urine YELLOW  YELLOW     APPearance CLEAR  CLEAR     Specific Gravity, Urine 1.024  1.005 - 1.030     pH 6.0  5.0 - 8.0     Glucose, UA NEGATIVE  NEGATIVE (mg/dL)    Hgb urine dipstick NEGATIVE  NEGATIVE     Bilirubin Urine NEGATIVE  NEGATIVE     Ketones, ur 15 (*) NEGATIVE (mg/dL)    Protein, ur NEGATIVE  NEGATIVE (mg/dL)    Urobilinogen, UA 0.2  0.0 - 1.0 (mg/dL)    Nitrite NEGATIVE  NEGATIVE     Leukocytes, UA NEGATIVE  NEGATIVE  MICROSCOPIC NOT DONE ON URINES WITH NEGATIVE PROTEIN, BLOOD, LEUKOCYTES, NITRITE, OR GLUCOSE <1000 mg/dL.  OCCULT BLOOD, POC DEVICE     Status: Normal   Collection Time   05/04/11  7:40 PM      Component Value Range Comment   Fecal Occult Bld POSITIVE     PREPARE RBC (CROSSMATCH)     Status: Normal   Collection Time   05/04/11 10:00 PM      Component Value Range Comment   Order Confirmation NO CURRENT SAMPLE, MUST ORDER TYPE AND SCREEN      Dg Chest 2 View  05/04/2011  *RADIOLOGY REPORT*  Clinical Data: Shortness of breath.  Recent upper respiratory tract infection.  CHEST - 2 VIEW  Comparison: Portal chest 05/21/2005.  Findings: The heart is mildly enlarged.  The lungs are clear.  Mild degenerative changes are noted in the thoracic spine.  The visualized soft tissues and bony thorax are unremarkable.  IMPRESSION: Borderline cardiomegaly without failure.  Original Report Authenticated By: Jamesetta Orleans. MATTERN, M.D.   Mr Brain Wo Contrast  05/04/2011  *RADIOLOGY REPORT*  Clinical Data: Ataxia.  MRI HEAD WITHOUT CONTRAST  Technique:   Multiplanar, multiecho pulse sequences of the brain and surrounding structures were obtained according to standard protocol without intravenous contrast.  Comparison: None.  Findings: No acute infarct.  No intracranial hemorrhage.  No intracranial mass lesion detected on this unenhanced exam.  Mild atrophy most notable parietal lobe.  No hydrocephalus.  Major intracranial vascular structures are patent.  Cervical medullary junction, pituitary region and pineal region unremarkable.  Mild exophthalmos.  Minimal mucosal thickening ethmoid sinus air cells.  IMPRESSION: No acute infarct.  Mild atrophy most notable parietal lobe.  Original Report Authenticated By: Fuller Canada, M.D.    ECG: NSR 92, RAD, normal P waves, narrow QRS, thin Q wave in III, inferior inverted TW.   Impression Present on Admission:  .GIB (gastrointestinal bleeding) .Abnormal EKG .Tachycardia .Leukocytosis  55yoM with no major history presents with likely UGIB, possibly NSAID induced.   1. Acute anemia: Hct with 29.1 but no prior for comparison, fecal occult positive, and BUN of 54.  Likely upper given dark black stools and NSAID's for past 1-2 weeks. He was minimally hypotense  and tachycardic but is overall stable appearing.   - IV Protonix bolus, then drip. Transfuse 1u PRBC's. Have spoken to GI who agreed to see pt in the  am, appreciated. Hold NSAID's, have counseled pt to avoid.  Keeping NPO   2. Abnormal ECG: has RAD, inferior inverted TW. Suspect is either clinically non relevant or right  sided strain from anemia. Cards already consulted for POC Trop that was positive, however true  venous enzymes negative. This is not ACS.   3. Leukocytosis: Suspect this is due to GIB. UA and CXR negative. Monitor.   Telemetry, [facility] team 8  Presumed full   Other plans as per orders.  Carmeline Kowal 05/04/2011, 9:40 PM

## 2011-05-04 NOTE — ED Notes (Signed)
PT STATES HE IS JUST GETTING OVER A HEAD COLD AND SORE THROAT FROM LAST WEEK. STATES ONSET CURRENT SYMPTOMS YESTERDAY.

## 2011-05-04 NOTE — ED Notes (Signed)
Admitting doctor at bedside 

## 2011-05-04 NOTE — ED Notes (Signed)
Pt presents with 1 week h/o chest pain.  Pt reports pain has been intermittent, is L sided and does not radiate.  Pt seen at Urgent Care due to unsteady gait and difficulty speaking x 2 days.  WBC was 16.8 and hgb 10.2.

## 2011-05-04 NOTE — ED Notes (Signed)
Regular Diet Tray Ordered 

## 2011-05-04 NOTE — ED Provider Notes (Signed)
4:58 PM Pt signed out to me by [employer]. Plan was to admit to [facility]. Spoke with Dr. Orland Jarred who feels patient should be admitted to Cardiology. Discussed this with Dr. Freida Busman who will see patient and consult with cardiology.  Results for orders placed during the hospital encounter of 05/04/11  CBC      Component Value Range   WBC 15.7 (*) 4.0 - 10.5 (K/uL)   RBC 3.30 (*) 4.22 - 5.81 (MIL/uL)   Hemoglobin 9.8 (*) 13.0 - 17.0 (g/dL)   HCT 04.5 (*) 40.9 - 52.0 (%)   MCV 88.2  78.0 - 100.0 (fL)   MCH 29.7  26.0 - 34.0 (pg)   MCHC 33.7  30.0 - 36.0 (g/dL)   RDW 81.1  91.4 - 78.2 (%)   Platelets 184  150 - 400 (K/uL)  BASIC METABOLIC PANEL      Component Value Range   Sodium 141  135 - 145 (mEq/L)   Potassium 4.3  3.5 - 5.1 (mEq/L)   Chloride 110  96 - 112 (mEq/L)   CO2 26  19 - 32 (mEq/L)   Glucose, Bld 100 (*) 70 - 99 (mg/dL)   BUN 54 (*) 6 - 23 (mg/dL)   Creatinine, Ser 9.56  0.50 - 1.35 (mg/dL)   Calcium 8.6  8.4 - 21.3 (mg/dL)   GFR calc non Af Amer 74 (*) >90 (mL/min)   GFR calc Af Amer 85 (*) >90 (mL/min)  POCT I-STAT TROPONIN I      Component Value Range   Troponin i, poc 0.16 (*) 0.00 - 0.08 (ng/mL)   Comment NOTIFIED PHYSICIAN     Comment 3            Mr Brain Wo Contrast  05/04/2011  *RADIOLOGY REPORT*  Clinical Data: Ataxia.  MRI HEAD WITHOUT CONTRAST  Technique:  Multiplanar, multiecho pulse sequences of the brain and surrounding structures were obtained according to standard protocol without intravenous contrast.  Comparison: None.  Findings: No acute infarct.  No intracranial hemorrhage.  No intracranial mass lesion detected on this unenhanced exam.  Mild atrophy most notable parietal lobe.  No hydrocephalus.  Major intracranial vascular structures are patent.  Cervical medullary junction, pituitary region and pineal region unremarkable.  Mild exophthalmos.  Minimal mucosal thickening ethmoid sinus air cells.  IMPRESSION: No acute infarct.  Mild atrophy most  notable parietal lobe.  Original Report Authenticated By: Fuller Canada, M.D.      Thomasene Lot, PA-C 05/04/11 1743  [facility] has consulted on the patient but feels patient should be admitted to general medicine. Spoke with Dr. Kaylyn Layer, with [facility], he will admit the patient for admission. Recommends Hemoccult and urinalysis. Hemoccult was positive. But patient did not have melena. Stool was normal colored.  Thomasene Lot, PA-C 05/04/11 1939  Thomasene Lot, PA-C 05/04/11 1939

## 2011-05-04 NOTE — ED Provider Notes (Signed)
Medical screening examination/treatment/procedure(s) were conducted as a shared visit with non-physician practitioner(s) and myself.  I personally evaluated the patient during the encounter  Pt complains of weakness and syncopal event.  Now complains of ataxia type balance problems.  T eave inversion noted with elevated troponin.  No CP now.  Will check MRI to assess for stroke. Will need admission regardless for further evaluation of the elevated cardiac enzymes and syncopal event.  Celene Kras, MD 05/04/11 [phone]

## 2011-05-04 NOTE — ED Notes (Signed)
[id number] Ready 

## 2011-05-05 ENCOUNTER — Encounter (HOSPITAL_COMMUNITY): Admission: EM | Disposition: A | Discharge status: Home / Self Care | Payer: Self-pay | Source: Ambulatory Visit

## 2011-05-05 ENCOUNTER — Encounter (HOSPITAL_COMMUNITY): Payer: Self-pay | Admitting: Physician Assistant

## 2011-05-05 DIAGNOSIS — D490 Neoplasm of unspecified behavior of digestive system: Secondary | ICD-10-CM

## 2011-05-05 DIAGNOSIS — K319 Disease of stomach and duodenum, unspecified: Secondary | ICD-10-CM

## 2011-05-05 DIAGNOSIS — R195 Other fecal abnormalities: Secondary | ICD-10-CM

## 2011-05-05 DIAGNOSIS — K3189 Other diseases of stomach and duodenum: Secondary | ICD-10-CM

## 2011-05-05 DIAGNOSIS — D62 Acute posthemorrhagic anemia: Secondary | ICD-10-CM

## 2011-05-05 DIAGNOSIS — K922 Gastrointestinal hemorrhage, unspecified: Secondary | ICD-10-CM

## 2011-05-05 HISTORY — PX: ESOPHAGOGASTRODUODENOSCOPY: SHX5428

## 2011-05-05 LAB — CBC
HCT: 28.7 % — ABNORMAL LOW (ref 39.0–52.0)
MCV: 88.3 fL (ref 78.0–100.0)
Platelets: 172 10*3/uL (ref 150–400)
RBC: 3.25 MIL/uL — ABNORMAL LOW (ref 4.22–5.81)
WBC: 12.2 10*3/uL — ABNORMAL HIGH (ref 4.0–10.5)

## 2011-05-05 LAB — CARDIAC PANEL(CRET KIN+CKTOT+MB+TROPI)
CK, MB: 1.6 ng/mL (ref 0.3–4.0)
Troponin I: <0.3 ng/mL (ref <0.30)

## 2011-05-05 SURGERY — EGD (ESOPHAGOGASTRODUODENOSCOPY)
Anesthesia: Moderate Sedation

## 2011-05-05 MED ORDER — FENTANYL CITRATE 0.05 MG/ML IJ SOLN
INTRAMUSCULAR | Status: AC
  Filled 2011-05-05: qty 2

## 2011-05-05 MED ORDER — FENTANYL NICU IV SYRINGE 50 MCG/ML
INJECTION | INTRAMUSCULAR | Status: DC | PRN
  Administered 2011-05-05 (×2): 25 ug via INTRAVENOUS

## 2011-05-05 MED ORDER — MIDAZOLAM HCL 10 MG/2ML IJ SOLN
INTRAMUSCULAR | Status: AC
  Filled 2011-05-05: qty 2

## 2011-05-05 MED ORDER — MIDAZOLAM HCL 10 MG/2ML IJ SOLN
INTRAMUSCULAR | Status: DC | PRN
  Administered 2011-05-05: 1 mg via INTRAVENOUS
  Administered 2011-05-05 (×2): 2 mg via INTRAVENOUS

## 2011-05-05 MED ORDER — BUTAMBEN-TETRACAINE-BENZOCAINE 2-2-14 % EX AERO
INHALATION_SPRAY | CUTANEOUS | Status: DC | PRN
  Administered 2011-05-05: 2 via TOPICAL

## 2011-05-05 MED ORDER — PANTOPRAZOLE SODIUM 40 MG PO TBEC
40.0000 mg | DELAYED_RELEASE_TABLET | Freq: Two times a day (BID) | ORAL | Status: DC
  Administered 2011-05-05 – 2011-05-06 (×3): 40 mg via ORAL
  Filled 2011-05-05 (×3): qty 1

## 2011-05-05 NOTE — Op Note (Signed)
[facility] [address] [place], [place]  [address]  ENDOSCOPY PROCEDURE REPORT  PATIENT:  John Keller, John Keller  MR#:  [id number] BIRTHDATE:  1955-04-28, 55 yrs. old  GENDER:  male  ENDOSCOPIST:  Wilhemina Bonito. Eda Keys, MD Referred by:  [facility],  PROCEDURE DATE:  05/05/2011 PROCEDURE:  EGD, diagnostic 43235 ASA CLASS:  Class II INDICATIONS:  melena, anemia  MEDICATIONS:   Fentanyl 50 mcg IV, Versed 5 mg IV TOPICAL ANESTHETIC:  Cetacaine Spray  DESCRIPTION OF PROCEDURE:   After the risks benefits and alternatives of the procedure were thoroughly explained, informed consent was obtained.  The Pentax Gastroscope [id number] endoscope was introduced through the mouth and advanced to the second portion of the duodenum, without limitations.  The instrument was slowly withdrawn as the mucosa was fully examined. <<PROCEDUREIMAGES>>  The esophagus and gastroesophageal junction were completely normal in appearance.  A 3CM SUBMUCOSAL MASS IN THE PROXIMAL POTERIOR STOMACH, ABOUT 3 CM ROM GEJ. NON-BLEEDING.  Otherwise the examination of the stomac and duodenum was normal.    Retroflexed views revealed the mass.    The scope was then withdrawn from the patient and the procedure completed.  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 Eda Keys, MD  CC:  The Patient; [facility];  [facility]  n. eSIGNED:   Wilhemina Bonito. Eda Keys at 05/05/2011 03:16 PM  Kizzie Furnish, [phone]

## 2011-05-05 NOTE — Progress Notes (Signed)
Subjective:  No complaints this am.  Objective:  Vital Signs in the last 24 hours: Temp:  [97.7 F (36.5 C)-99.3 F (37.4 C)] 98.7 F (37.1 C) (03/07 0421) Pulse Rate:  [83-110] 83  (03/07 0421) Resp:  [16-24] 16  (03/07 0421) BP: (98-133)/(52-78) 124/70 mmHg (03/07 0421) SpO2:  [98 %-100 %] 98 % (03/07 0033) Weight:  [124 kg (273 lb 5.9 oz)-124.739 kg (275 lb)] 124 kg (273 lb 5.9 oz) (03/06 2235)  Intake/Output from previous day: No intake or output data in the 24 hours ending 05/05/11 0910  Physical Exam: General appearance: alert, cooperative and no distress Lungs: clear to auscultation bilaterally Heart: regular rate and rhythm   Rate: 70  Rhythm: normal sinus rhythm  Lab Results:  Basename 05/05/11 0528 05/04/11 1455  WBC 12.2* 15.7*  HGB 9.6* 9.8*  PLT 172 184    Basename 05/04/11 1455  NA 141  K 4.3  CL 110  CO2 26  GLUCOSE 100*  BUN 54*  CREATININE 1.11    Basename 05/05/11 0215 05/04/11 1813  TROPONINI <0.30 <0.30   Hepatic Function Panel No results found for this basename: PROT,ALBUMIN,AST,ALT,ALKPHOS,BILITOT,BILIDIR,IBILI in the last 72 hours No results found for this basename: CHOL in the last 72 hours No results found for this basename: INR in the last 72 hours  Imaging: Imaging results have been reviewed  Cardiac Studies:  Assessment/Plan:   Principal Problem:  *GIB (gastrointestinal bleeding) Active Problems:  Abnormal EKG  Tachycardia  Leukocytosis  Plan- 2D pending, cardiac markers negative, OP myoview at a later date if 2D normal.    Corine Shelter PA-C 05/05/2011, 9:10 AM

## 2011-05-05 NOTE — Consult Note (Signed)
Reason for Consult: Gastric mass Referring Physician: Kavari Parrillo is an 56 y.o. male.  HPI: Patient was admitted with a 2 to three-day history of melena. He denies abdominal pain. He denies previous episodes of melena. He underwent evaluation by gastroenterology. Esophagogastro duodenoscopy was performed. This demonstrated a submucosal mass consistent with GIST or leiomyoma. We are asked to evaluate from a surgical standpoint. Patient otherwise has been feeling well.  Past Medical History  Diagnosis Date  . Anemia 2009    noted during hospitalization for surgical I&D of chest, arm abscess  . Depression     Past Surgical History  Procedure Date  . Incise and drain abcess 2009    patient had abscesses and cellulitis of the chest and arm which grew out microfollicular strap.    Family History  Problem Relation Age of Onset  . Anesthesia problems Neg Hx   . Hypotension Neg Hx   . Malignant hyperthermia Neg Hx   . Pseudochol deficiency Neg Hx     Social History:  reports that he has never smoked. He does not have any smokeless tobacco history on file. He reports that he does not drink alcohol or use illicit drugs.  Allergies: No Known Allergies  Medications: I have reviewed the patient's current medications.  Results for orders placed during the hospital encounter of 05/04/11 (from the past 48 hour(s))  CBC     Status: Abnormal   Collection Time   05/04/11  2:55 PM      Component Value Range Comment   WBC 15.7 (*) 4.0 - 10.5 (K/uL)    RBC 3.30 (*) 4.22 - 5.81 (MIL/uL)    Hemoglobin 9.8 (*) 13.0 - 17.0 (g/dL)    HCT 95.6 (*) 21.3 - 52.0 (%)    MCV 88.2  78.0 - 100.0 (fL)    MCH 29.7  26.0 - 34.0 (pg)    MCHC 33.7  30.0 - 36.0 (g/dL)    RDW 08.6  57.8 - 46.9 (%)    Platelets 184  150 - 400 (K/uL)   BASIC METABOLIC PANEL     Status: Abnormal   Collection Time   05/04/11  2:55 PM      Component Value Range Comment   Sodium 141  135 - 145 (mEq/L)    Potassium 4.3  3.5 -  5.1 (mEq/L)    Chloride 110  96 - 112 (mEq/L)    CO2 26  19 - 32 (mEq/L)    Glucose, Bld 100 (*) 70 - 99 (mg/dL)    BUN 54 (*) 6 - 23 (mg/dL)    Creatinine, Ser 6.29  0.50 - 1.35 (mg/dL)    Calcium 8.6  8.4 - 10.5 (mg/dL)    GFR calc non Af Amer 74 (*) >90 (mL/min)    GFR calc Af Amer 85 (*) >90 (mL/min)   POCT I-STAT TROPONIN I     Status: Abnormal   Collection Time   05/04/11  3:07 PM      Component Value Range Comment   Troponin i, poc 0.16 (*) 0.00 - 0.08 (ng/mL)    Comment NOTIFIED PHYSICIAN      Comment 3            CARDIAC PANEL(CRET KIN+CKTOT+MB+TROPI)     Status: Normal   Collection Time   05/04/11  6:13 PM      Component Value Range Comment   Total CK 62  7 - 232 (U/L)    CK, MB 1.9  0.3 -  4.0 (ng/mL)    Troponin I <0.30  <0.30 (ng/mL)    Relative Index RELATIVE INDEX IS INVALID  0.0 - 2.5    URINALYSIS, ROUTINE W REFLEX MICROSCOPIC     Status: Abnormal   Collection Time   05/04/11  7:30 PM      Component Value Range Comment   Color, Urine YELLOW  YELLOW     APPearance CLEAR  CLEAR     Specific Gravity, Urine 1.024  1.005 - 1.030     pH 6.0  5.0 - 8.0     Glucose, UA NEGATIVE  NEGATIVE (mg/dL)    Hgb urine dipstick NEGATIVE  NEGATIVE     Bilirubin Urine NEGATIVE  NEGATIVE     Ketones, ur 15 (*) NEGATIVE (mg/dL)    Protein, ur NEGATIVE  NEGATIVE (mg/dL)    Urobilinogen, UA 0.2  0.0 - 1.0 (mg/dL)    Nitrite NEGATIVE  NEGATIVE     Leukocytes, UA NEGATIVE  NEGATIVE  MICROSCOPIC NOT DONE ON URINES WITH NEGATIVE PROTEIN, BLOOD, LEUKOCYTES, NITRITE, OR GLUCOSE <1000 mg/dL.  OCCULT BLOOD, POC DEVICE     Status: Normal   Collection Time   05/04/11  7:40 PM      Component Value Range Comment   Fecal Occult Bld POSITIVE     PREPARE RBC (CROSSMATCH)     Status: Normal   Collection Time   05/04/11 10:00 PM      Component Value Range Comment   Order Confirmation NO CURRENT SAMPLE, MUST ORDER TYPE AND SCREEN     TYPE AND SCREEN     Status: Normal (Preliminary result)   Collection  Time   05/04/11 10:10 PM      Component Value Range Comment   ABO/RH(D) A POS      Antibody Screen NEG      Sample Expiration 05/07/2011      Unit Number [id number]      Blood Component Type RED CELLS,LR      Unit division 00      Status of Unit ISSUED      Transfusion Status OK TO TRANSFUSE      Crossmatch Result Compatible     ABO/RH     Status: Normal   Collection Time   05/04/11 10:10 PM      Component Value Range Comment   ABO/RH(D) A POS     CARDIAC PANEL(CRET KIN+CKTOT+MB+TROPI)     Status: Normal   Collection Time   05/05/11  2:15 AM      Component Value Range Comment   Total CK 55  7 - 232 (U/L)    CK, MB 1.6  0.3 - 4.0 (ng/mL)    Troponin I <0.30  <0.30 (ng/mL)    Relative Index RELATIVE INDEX IS INVALID  0.0 - 2.5    CBC     Status: Abnormal   Collection Time   05/05/11  5:28 AM      Component Value Range Comment   WBC 12.2 (*) 4.0 - 10.5 (K/uL)    RBC 3.25 (*) 4.22 - 5.81 (MIL/uL)    Hemoglobin 9.6 (*) 13.0 - 17.0 (g/dL)    HCT 40.9 (*) 81.1 - 52.0 (%)    MCV 88.3  78.0 - 100.0 (fL)    MCH 29.5  26.0 - 34.0 (pg)    MCHC 33.4  30.0 - 36.0 (g/dL)    RDW 91.4  78.2 - 95.6 (%)    Platelets 172  150 - 400 (K/uL)  Dg Chest 2 View  05/04/2011  *RADIOLOGY REPORT*  Clinical Data: Shortness of breath.  Recent upper respiratory tract infection.  CHEST - 2 VIEW  Comparison: Portal chest 05/21/2005.  Findings: The heart is mildly enlarged.  The lungs are clear.  Mild degenerative changes are noted in the thoracic spine.  The visualized soft tissues and bony thorax are unremarkable.  IMPRESSION: Borderline cardiomegaly without failure.  Original Report Authenticated By: Jamesetta Orleans. MATTERN, M.D.   Mr Brain Wo Contrast  05/04/2011  *RADIOLOGY REPORT*  Clinical Data: Ataxia.  MRI HEAD WITHOUT CONTRAST  Technique:  Multiplanar, multiecho pulse sequences of the brain and surrounding structures were obtained according to standard protocol without intravenous contrast.  Comparison:  None.  Findings: No acute infarct.  No intracranial hemorrhage.  No intracranial mass lesion detected on this unenhanced exam.  Mild atrophy most notable parietal lobe.  No hydrocephalus.  Major intracranial vascular structures are patent.  Cervical medullary junction, pituitary region and pineal region unremarkable.  Mild exophthalmos.  Minimal mucosal thickening ethmoid sinus air cells.  IMPRESSION: No acute infarct.  Mild atrophy most notable parietal lobe.  Original Report Authenticated By: Fuller Canada, M.D.    Review of Systems  Constitutional: Negative.   HENT: Negative.   Eyes: Negative.   Respiratory: Negative.   Cardiovascular: Negative.   Gastrointestinal: Positive for melena. Negative for vomiting, abdominal pain and blood in stool.  Genitourinary: Negative.   Musculoskeletal: Negative.   Skin: Negative.   Neurological: Negative.   Endo/Heme/Allergies: Negative.    Blood pressure 133/82, pulse 79, temperature 98.1 F (36.7 C), temperature source Oral, resp. rate 20, height 5\' 10"  (1.778 m), weight 124 kg (273 lb 5.9 oz), SpO2 99.00%. Physical Exam  Constitutional: He appears well-developed and well-nourished.  HENT:  Head: Normocephalic and atraumatic.  Eyes: EOM are normal. Pupils are equal, round, and reactive to light.  Neck: Normal range of motion. Neck supple. No tracheal deviation present.  Cardiovascular: Normal rate, regular rhythm, normal heart sounds and intact distal pulses.   Respiratory: Effort normal and breath sounds normal. No respiratory distress. He has no wheezes. He has no rales.  GI: Soft. Bowel sounds are normal. He exhibits no distension. There is no tenderness. There is no rebound and no guarding.  Musculoskeletal: Normal range of motion.       Scars from previous industrial accident left axilla and left medial arm  Skin: Skin is warm and dry.    Assessment/Plan: GI bleed secondary to gastric mass. This is likely a GIST versus a leiomyoma. I  recommend partial gastrectomy with resection of this mass. Further treatment will depend on pathology results. All discussed this case in detail with my partner Dr. Lindie Spruce. Timing of surgical plans is pending. I will make the patient n.p.o. in case surgery is able to be done in the morning. I discussed this plan in detail with the patient. Questions were answered.  Murat Rideout E 05/05/2011, 9:35 PM

## 2011-05-05 NOTE — Progress Notes (Signed)
Pt. Seen and examined. Agree with the NP/PA-C note as written.  Leukocytosis is improving. I agree that the chest pain does not sound cardiac. Will review 2D echo today. POC troponin was high, but susequent troponins have been negative. I believe this is likely false positive. Given risk factors, may consider outpatient NST. I agree with GI work-up.  Chrystie Nose, MD, [facility] Attending Cardiologist [facility]

## 2011-05-05 NOTE — Consult Note (Signed)
John Keller: 11:13 AM 05/05/2011   Referring Provider: Carlota Raspberry Primary Care Physician:  No primary provider on file. Primary Gastroenterologist:  none   Reason for Consultation:  GI bleed  HPI: LENN VOLKER is a 56 y.o. male.  Patient has had upper respiratory symptoms of nasal congestion, cough, sore throat along with flulike symptoms of malaise, joint aches, muscle aches over the past couple of weeks. He had a fever up to a 100.0 one day.   He has been self-medicating with Aleve a couple of times a day in addition to other cough and cold remedies. He also takes up to 4 Excedrin several times a week for general aches and pains, he's been doing this for over a year. Patient does not have upper GI distress generally. He does not require the use of acid suppression medications.  Over the weekend patient developed dark, nearly black, formed stools. His stomach felt upset but he was not nauseous nor vomiting.  Patient became progressively fatigued. When he came home from his second job at about 4 AM on Wednesday morning, he was so weak that he fell from the couch onto the floor, where he laid for several hours, unable to get himself up. He is not certain if he had a syncopal event or not. He does report that when he woke up he had been incontinent of urine. He doesn't report dizziness however. Finally he got up, shower and took himself to [facility]. [facility]. At the emergency room blood pressures was 98/60 low, heart rate 110. Labs showed hemoglobin of 9.8, normal MCV.  BUN elevated at 54 with normal creatinine. Stool testing was positive for occult blood.  No coags or LFTs have been obtained.  He was admitted and transfused with one unit packed red blood cells. Follow up hemoglobin is 9.6. MCVs are normal. Cardiologist has seen the patient because of some EKG changes end a troponin elevated at 0.16.  Patient has not had chest pain, palpitations. Dr. Royann Shivers feels that the constellation of symptoms and lab findings are consistent with GI bleed and are highly atypical for acute coronary syndrome and has recommended full cardiac enzyme panel and echocardiogram.  Patient has never seen blood in his stool.  I found a surgical discharge summary from 2009 when he had been admitted for I&D of abscesses and cellulitis of the chest and upper extremities. Hemoglobin noted to be 10 during that hospitalization. Patient's had remote colonoscopy. This was done in [place]. He did not have polyps.  He was told that he had irritable bowel syndrome. He reports symptoms consistent with lactose intolerance. Patient does not drink alcohol, does not have history of liver disease Patient is extremely hard-working. 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  Past Medical History  Diagnosis Date  . Depression     History reviewed. No pertinent past surgical history.  Prior to Admission medications   Medication Sig Start Date End Date Taking? Authorizing Provider  aspirin-acetaminophen-caffeine (EXCEDRIN EXTRA STRENGTH) [phone] MG per tablet Take 1-2 tablets by mouth every 6 (six) hours as needed. For pain   Yes Historical Provider, MD  Codeine-Chlorpheniramine-APAP (COTABFLU PO) Take 1-2 tablets by mouth daily.   Yes Historical Provider, MD  dimenhyDRINATE (DRAMAMINE) 50 MG tablet Take 50 mg by mouth every 8 (eight) hours as needed. For dizzieness  Yes Historical Provider, MD  hydroxypropyl methylcellulose (ISOPTO TEARS) 2.5 % ophthalmic solution Place 1 drop into both eyes 4 (four) times daily as needed. For dry eyes   Yes Historical Provider, MD  Multiple Vitamin (MULITIVITAMIN WITH MINERALS) TABS Take 1 tablet by mouth daily.   Yes Historical Provider, MD  naproxen (NAPROSYN) 250 MG tablet Take 250-500 mg by mouth 2 (two) times  daily with a meal. For pain   Yes Historical Provider, MD    Scheduled Meds:    . aspirin  324 mg Oral Once  . meclizine  25 mg Oral Once  . pantoprazole (PROTONIX) IV  80 mg Intravenous Once  . sodium chloride  3 mL Intravenous Q12H   Infusions:    . sodium chloride 100 mL/hr at 05/05/11 0004  . pantoprozole (PROTONIX) infusion 8 mg/hr (05/05/11 0032)   PRN Meds: acetaminophen, acetaminophen, ondansetron (ZOFRAN) IV, ondansetron   Allergies as of 05/04/2011  . (No Known Allergies)    No family history on file.  History   Social History  . Marital Status: Single    Spouse Name: N/A    Number of Children: N/A  . Years of Education: N/A   Occupational History  . Not on file.   Social History Main Topics  . Smoking status: Never Smoker   . Smokeless tobacco: Not on file  . Alcohol Use: No  . Drug Use: No  . Sexually Active: No   Other Topics Concern  . Not on file   Social History Narrative  . No narrative on file    REVIEW OF SYSTEMS: Constitutional:  No weight loss ENT:  Sinus congestion but no blood in nasal discharge Pulm:  Dry cough. Some dyspnea CV:  No palpitations, no chest pressure, no upper extremity tingling or numbness. GU:  No nocturia, no hematuria. GI:  No dysphagia. When he is well appetite is good. Heme:  No prior issues with low blood counts.    Transfusions:  None before last night Neuro:  Wears glasses for distance i.e. Requires classes to drive. Derm:  No rash, sores, itching Endocrine:  No history of diabetes Immunization:  Did not receive a flu shot for this current disease and Travel:  none   PHYSICAL EXAM: Vital signs in last 24 hours: Temp:  [97.7 F (36.5 C)-99.3 F (37.4 C)] 98.7 F (37.1 C) (03/07 0421) Pulse Rate:  [83-110] 83  (03/07 0421) Resp:  [16-24] 16  (03/07 0421) BP: (98-133)/(52-78) 124/70 mmHg (03/07 0421) SpO2:  [98 %-100 %] 98 % (03/07 0033) Weight:  [273 lb 5.9 oz (124 kg)-275 lb (124.739 kg)] 273  lb 5.9 oz (124 kg) (03/06 2235)  General: pleasant, obese gentleman who is in no distress. Head:  Normocephalic, atraumatic  Eyes:  No scleral icterus or conjunctival pallor. EOMI Ears:  No difficulty hearing  Nose:  No rhinorrhea Mouth:  Teeth in good repair. Oral mucosa pink, moist, clear Neck:  JVD, no masses, no thyromegaly Lungs:  Clear to auscultation and percussion bilaterally Heart: regular rate and rhythm. S1-S2 audible. No murmurs rubs, gallops Abdomen:  soft, nondistended, active bowel sounds.  No masses, no hepatosplenomegaly, no bruits Rectal: rectal exam was deferred.  Stool was positive for blood last night   Musc/Skeltl: no joint deformities or swelling Extremities:  Slight nonpitting pedal edema bilaterally  Neurologic:  Or tremor. Upper and lower extremity limb strength is full. Not confused,  he is fully oriented. There is some delay in his speech, similar to  an individual who has hearing deficits. His speech is very easy to understand but just slow.  However he denies hearing problems Skin:  No rash, sores. Tattoos:  none Nodes:  No cervical or inguinal adenopathy.   Psych:  Very pleasant, relaxed, appropriate the  Intake/Output from previous day:   Intake/Output this shift:    LAB RESULTS:  Basename 05/05/11 0528 05/04/11 1455  WBC 12.2* 15.7*  HGB 9.6* 9.8*  HCT 28.7* 29.1*  PLT 172 184   BMET Lab Results  Component Value Date   NA 141 05/04/2011   K 4.3 05/04/2011   CL 110 05/04/2011   CO2 26 05/04/2011   GLUCOSE 100* 05/04/2011   BUN 54* 05/04/2011   CREATININE 1.11 05/04/2011   CALCIUM 8.6 05/04/2011   LFT No results found for this basename: PROT:3,ALBUMIN:3,AST:3,ALT:3,ALKPHOS:3,BILITOT:3,BILIDIR:3,IBILI:3 in the last 72 hours PT/INR No results found for this basename: INR,  PROTIME   Hepatitis Panel No results found for this basename: HEPBSAG,HCVAB,HEPAIGM,HEPBIGM in the last 72 hours C-Diff No components found with this basename: cdiff    Drugs of  Abuse  No results found for this basename: labopia,  cocainscrnur,  labbenz,  amphetmu,  thcu,  labbarb     RADIOLOGY STUDIES: Dg Chest 2 View  05/04/2011  *RADIOLOGY REPORT*  Clinical Data: Shortness of breath.  Recent upper respiratory tract infection.  CHEST - 2 VIEW  Comparison: Portal chest 05/21/2005.  Findings: The heart is mildly enlarged.  The lungs are clear.  Mild degenerative changes are noted in the thoracic spine.  The visualized soft tissues and bony thorax are unremarkable.  IMPRESSION: Borderline cardiomegaly without failure.  Original Report Authenticated By: Jamesetta Orleans. MATTERN, M.D.   Mr Brain Wo Contrast  05/04/2011  *RADIOLOGY REPORT*  Clinical Data: Ataxia.  MRI HEAD WITHOUT CONTRAST  Technique:  Multiplanar, multiecho pulse sequences of the brain and surrounding structures were obtained according to standard protocol without intravenous contrast.  Comparison: None.  Findings: No acute infarct.  No intracranial hemorrhage.  No intracranial mass lesion detected on this unenhanced exam.  Mild atrophy most notable parietal lobe.  No hydrocephalus.  Major intracranial vascular structures are patent.  Cervical medullary junction, pituitary region and pineal region unremarkable.  Mild exophthalmos.  Minimal mucosal thickening ethmoid sinus air cells.  IMPRESSION: No acute infarct.  Mild atrophy most notable parietal lobe.  Original Report Authenticated By: Fuller Canada, M.D.    ENDOSCOPIC STUDIES: Colonoscopy greater than 10 years ago performed at a hospital in [place], [place].  Told he had IBS.  IMPRESSION: 1. Heme positive stools with symptomatic anemia. Has received 1 unit packed red blood cells and hemoglobin holding steady. Given his chronic Excedrin use and recent additional use of Aleve, need to rule out NSAID-induced ulcers. Previously noted anemia, hemoglobin 10, during hospitalization in 2009 2. Possible syncope. 3. Cardiomegaly 4.  Leukocytosis without  evidence for urinary tract infection or pneumonia.  This may be from his recent flulike illness. 5. Exophthalmus seen on brain MRI.  We should probably check a TSH on the patient.  PLAN: 1. We'll try to arrange upper endoscopy for this afternoon. I have discontinued the Protonix drip. Twice daily scheduled protonic should be sufficient.   2.Will also check a TSH given the exophthalmus noted on MRI (FURTHER WORKUP, IF ANY, PER PRIMARY TEAM).   LOS: 1 day   Jennye Moccasin  05/05/2011, 11:13 AM Pager: [phone]  GI ATTENDING  HX, LABS, X-RAYS REVIEWED. PATIENT SEEN AND EXAMINED. AGREE WITH H&P  AS SCRIBED ABOVE. PATIENT REPORTS PRE-SYCOPE/SYNCOPE IN FACE OF TRANSIENT LOOSE STOOLS C/W MELENA. EVALUATION REVEALS ANEMIA, ELEVATED BUN, AND HEME + STOOL. SUSPECT UGI BLEED FROM NSAID INDUCED LESION. PLAN URGENT EGD.The nature of the procedure, as well as the risks, benefits, and alternatives were carefully and thoroughly reviewed with the patient. Ample time for discussion and questions allowed. The patient understood, was satisfied, and agreed to proceed.   Wilhemina Bonito. Eda Keys., M.D. [facility] Division of Gastroenterology

## 2011-05-05 NOTE — Progress Notes (Signed)
[facility] Inpatient Progress Note  05/05/2011  Subjective: Pt tolerated procedure well. Will continue bid ppi for now.  Pt eating and tolerating diet.   Objective:  Vital signs in last 24 hours: Filed Vitals:   05/05/11 1520 05/05/11 1530 05/05/11 1540 05/05/11 1550  BP: 142/84 141/78 152/91 153/98  Pulse:      Temp:      TempSrc:      Resp: 11 11 14 14   Height:      Weight:      SpO2: 97% 98% 96% 97%   Weight change:  No intake or output data in the 24 hours ending 05/05/11 1746  Review of Systems A comprehensive review of systems was negative.  Physical Exam BP 153/98  Pulse 83  Temp(Src) 98.8 F (37.1 C) (Oral)  Resp 14  Ht 5\' 10"  (1.778 m)  Wt 124 kg (273 lb 5.9 oz)  BMI 39.22 kg/m2  SpO2 97%  General Appearance:    Alert, cooperative, no distress, appears stated age  Head:    Normocephalic, without obvious abnormality, atraumatic  Eyes:    PERRL, conjunctiva/corneas clear, EOM's intact, fundi    benign, both eyes          Nose:   Nares normal, septum midline, mucosa normal, no drainage    or sinus tenderness  Throat:   Lips, mucosa, and tongue normal; teeth and gums normal  Neck:   Supple, symmetrical, trachea midline, no adenopathy;       thyroid:  No enlargement/tenderness/nodules; no carotid   bruit or JVD     Lungs:     Clear to auscultation bilaterally, respirations unlabored  Chest wall:    No tenderness or deformity  Heart:    Regular rate and rhythm, S1 and S2 normal, no murmur, rub   or gallop  Abdomen:     Soft, non-tender, bowel sounds active all four quadrants,    no masses, no organomegaly        Extremities:   Extremities normal, atraumatic, no cyanosis or edema  Pulses:   2+ and symmetric all extremities  Skin:   Skin color, texture, turgor normal, no rashes or lesions     Neurologic:   CNII-XII intact. Normal strength, sensation and reflexes      throughout    Lab Results: Results for orders placed during the hospital  encounter of 05/04/11 (from the past 24 hour(s))  CARDIAC PANEL(CRET KIN+CKTOT+MB+TROPI)     Status: Normal   Collection Time   05/04/11  6:13 PM      Component Value Range   Total CK 62  7 - 232 (U/L)   CK, MB 1.9  0.3 - 4.0 (ng/mL)   Troponin I <0.30  <0.30 (ng/mL)   Relative Index RELATIVE INDEX IS INVALID  0.0 - 2.5   URINALYSIS, ROUTINE W REFLEX MICROSCOPIC     Status: Abnormal   Collection Time   05/04/11  7:30 PM      Component Value Range   Color, Urine YELLOW  YELLOW    APPearance CLEAR  CLEAR    Specific Gravity, Urine 1.024  1.005 - 1.030    pH 6.0  5.0 - 8.0    Glucose, UA NEGATIVE  NEGATIVE (mg/dL)   Hgb urine dipstick NEGATIVE  NEGATIVE    Bilirubin Urine NEGATIVE  NEGATIVE    Ketones, ur 15 (*) NEGATIVE (mg/dL)   Protein, ur NEGATIVE  NEGATIVE (mg/dL)   Urobilinogen, UA 0.2  0.0 - 1.0 (mg/dL)  Nitrite NEGATIVE  NEGATIVE    Leukocytes, UA NEGATIVE  NEGATIVE   OCCULT BLOOD, POC DEVICE     Status: Normal   Collection Time   05/04/11  7:40 PM      Component Value Range   Fecal Occult Bld POSITIVE    PREPARE RBC (CROSSMATCH)     Status: Normal   Collection Time   05/04/11 10:00 PM      Component Value Range   Order Confirmation NO CURRENT SAMPLE, MUST ORDER TYPE AND SCREEN    TYPE AND SCREEN     Status: Normal (Preliminary result)   Collection Time   05/04/11 10:10 PM      Component Value Range   ABO/RH(D) A POS     Antibody Screen NEG     Sample Expiration 05/07/2011     Unit Number [id number]     Blood Component Type RED CELLS,LR     Unit division 00     Status of Unit ISSUED     Transfusion Status OK TO TRANSFUSE     Crossmatch Result Compatible    ABO/RH     Status: Normal   Collection Time   05/04/11 10:10 PM      Component Value Range   ABO/RH(D) A POS    CARDIAC PANEL(CRET KIN+CKTOT+MB+TROPI)     Status: Normal   Collection Time   05/05/11  2:15 AM      Component Value Range   Total CK 55  7 - 232 (U/L)   CK, MB 1.6  0.3 - 4.0 (ng/mL)   Troponin I <0.30   <0.30 (ng/mL)   Relative Index RELATIVE INDEX IS INVALID  0.0 - 2.5   CBC     Status: Abnormal   Collection Time   05/05/11  5:28 AM      Component Value Range   WBC 12.2 (*) 4.0 - 10.5 (K/uL)   RBC 3.25 (*) 4.22 - 5.81 (MIL/uL)   Hemoglobin 9.6 (*) 13.0 - 17.0 (g/dL)   HCT 78.2 (*) 95.6 - 52.0 (%)   MCV 88.3  78.0 - 100.0 (fL)   MCH 29.5  26.0 - 34.0 (pg)   MCHC 33.4  30.0 - 36.0 (g/dL)   RDW 21.3  08.6 - 57.8 (%)   Platelets 172  150 - 400 (K/uL)    Micro Results: No results found for this or any previous visit (from the past 240 hour(s)).  Studies/Results: Dg Chest 2 View  05/04/2011  *RADIOLOGY REPORT*  Clinical Data: Shortness of breath.  Recent upper respiratory tract infection.  CHEST - 2 VIEW  Comparison: Portal chest 05/21/2005.  Findings: The heart is mildly enlarged.  The lungs are clear.  Mild degenerative changes are noted in the thoracic spine.  The visualized soft tissues and bony thorax are unremarkable.  IMPRESSION: Borderline cardiomegaly without failure.  Original Report Authenticated By: Jamesetta Orleans. MATTERN, M.D.   Mr Brain Wo Contrast  05/04/2011  *RADIOLOGY REPORT*  Clinical Data: Ataxia.  MRI HEAD WITHOUT CONTRAST  Technique:  Multiplanar, multiecho pulse sequences of the brain and surrounding structures were obtained according to standard protocol without intravenous contrast.  Comparison: None.  Findings: No acute infarct.  No intracranial hemorrhage.  No intracranial mass lesion detected on this unenhanced exam.  Mild atrophy most notable parietal lobe.  No hydrocephalus.  Major intracranial vascular structures are patent.  Cervical medullary junction, pituitary region and pineal region unremarkable.  Mild exophthalmos.  Minimal mucosal thickening ethmoid sinus air cells.  IMPRESSION:  No acute infarct.  Mild atrophy most notable parietal lobe.  Original Report Authenticated By: Fuller Canada, M.D.    Medications:  Scheduled Meds:   . pantoprazole (PROTONIX)  IV  80 mg Intravenous Once  . pantoprazole  40 mg Oral BID AC  . sodium chloride  3 mL Intravenous Q12H   Continuous Infusions:   . sodium chloride 100 mL/hr at 05/05/11 0004  . DISCONTD: pantoprozole (PROTONIX) infusion 8 mg/hr (05/05/11 0032)   PRN Meds:.acetaminophen, acetaminophen, ondansetron (ZOFRAN) IV, ondansetron, DISCONTD: butamben-tetracaine-benzocaine, DISCONTD: fentaNYL, DISCONTD: midazolam  Assessment/Plan:  GIB (gastrointestinal bleeding) (05/04/2011)  - continue BID PPI therapy  - consult general surgery for evaluation of stomach mass  Anemia - will follow, s/p PRBC transfusion  Tachycardia (05/04/2011)   - following tele   - check TSH  Leukocytosis (05/04/2011)   -repeat in AM  Gastric mass (05/05/2011)   - CCS consult for eval    LOS: 1 day   John Keller 05/05/2011, 5:46 PM   Cleora Fleet, MD, CDE, FAAFP [facility] [place], [place]  [phone]

## 2011-05-06 ENCOUNTER — Encounter (HOSPITAL_COMMUNITY): Payer: Self-pay | Admitting: Anesthesiology

## 2011-05-06 ENCOUNTER — Inpatient Hospital Stay (HOSPITAL_COMMUNITY): Payer: Self-pay | Admitting: Anesthesiology

## 2011-05-06 ENCOUNTER — Other Ambulatory Visit: Payer: Self-pay

## 2011-05-06 ENCOUNTER — Encounter (HOSPITAL_COMMUNITY): Admission: EM | Disposition: A | Discharge status: Home / Self Care | Payer: Self-pay | Source: Ambulatory Visit

## 2011-05-06 LAB — SURGICAL PCR SCREEN
MRSA, PCR: NEGATIVE
Staphylococcus aureus: NEGATIVE

## 2011-05-06 LAB — CBC
MCH: 29.7 pg (ref 26.0–34.0)
MCHC: 33.5 g/dL (ref 30.0–36.0)
MCHC: 34 g/dL (ref 30.0–36.0)
Platelets: 167 10*3/uL (ref 150–400)
Platelets: 174 10*3/uL (ref 150–400)
RDW: 14.6 % (ref 11.5–15.5)
RDW: 14.9 % (ref 11.5–15.5)
WBC: 9.9 10*3/uL (ref 4.0–10.5)

## 2011-05-06 LAB — PREPARE RBC (CROSSMATCH)

## 2011-05-06 LAB — PROTIME-INR: Prothrombin Time: 15 seconds (ref 11.6–15.2)

## 2011-05-06 LAB — CARDIAC PANEL(CRET KIN+CKTOT+MB+TROPI)
CK, MB: 2.4 ng/mL (ref 0.3–4.0)
Relative Index: INVALID (ref 0.0–2.5)
Total CK: 71 U/L (ref 7–232)
Troponin I: <0.3 ng/mL (ref <0.30)

## 2011-05-06 LAB — COMPREHENSIVE METABOLIC PANEL
AST: 13 U/L (ref 0–37)
Albumin: 2.5 g/dL — ABNORMAL LOW (ref 3.5–5.2)
Alkaline Phosphatase: 53 U/L (ref 39–117)
Chloride: 113 mEq/L — ABNORMAL HIGH (ref 96–112)
Potassium: 3.9 mEq/L (ref 3.5–5.1)
Total Bilirubin: 0.4 mg/dL (ref 0.3–1.2)
Total Protein: 5.2 g/dL — ABNORMAL LOW (ref 6.0–8.3)

## 2011-05-06 LAB — GLUCOSE, CAPILLARY: Glucose-Capillary: 181 mg/dL — ABNORMAL HIGH (ref 70–99)

## 2011-05-06 SURGERY — GASTRECTOMY, PARTIAL
Anesthesia: General | Site: Abdomen | Wound class: Clean Contaminated

## 2011-05-06 MED ORDER — LACTATED RINGERS IV SOLN
INTRAVENOUS | Status: DC
  Administered 2011-05-06: 09:00:00 via INTRAVENOUS

## 2011-05-06 MED ORDER — FENTANYL CITRATE 0.05 MG/ML IJ SOLN
INTRAMUSCULAR | Status: DC | PRN
  Administered 2011-05-06: 100 ug via INTRAVENOUS
  Administered 2011-05-06 (×2): 50 ug via INTRAVENOUS
  Administered 2011-05-06: 100 ug via INTRAVENOUS
  Administered 2011-05-06 (×2): 50 ug via INTRAVENOUS

## 2011-05-06 MED ORDER — HYDROMORPHONE 0.3 MG/ML IV SOLN
INTRAVENOUS | Status: DC
  Administered 2011-05-06: 2.7 mg via INTRAVENOUS
  Administered 2011-05-06: 16:00:00 via INTRAVENOUS
  Administered 2011-05-07: 1.8 mg via INTRAVENOUS
  Administered 2011-05-07: 05:00:00 via INTRAVENOUS
  Administered 2011-05-07: 2.1 mg via INTRAVENOUS
  Administered 2011-05-07: 2.4 mg via INTRAVENOUS
  Administered 2011-05-07: 1.2 mg via INTRAVENOUS
  Administered 2011-05-07: 0.3 mL via INTRAVENOUS
  Administered 2011-05-08: 2.1 mg via INTRAVENOUS
  Administered 2011-05-08: 0.6 mg via INTRAVENOUS
  Administered 2011-05-09: 03:00:00 via INTRAVENOUS
  Administered 2011-05-09 (×2): 0.3 mg via INTRAVENOUS

## 2011-05-06 MED ORDER — HEMOSTATIC AGENTS (NO CHARGE) OPTIME
TOPICAL | Status: DC | PRN
  Administered 2011-05-06: 1

## 2011-05-06 MED ORDER — ROCURONIUM BROMIDE 100 MG/10ML IV SOLN
INTRAVENOUS | Status: DC | PRN
  Administered 2011-05-06 (×3): 10 mg via INTRAVENOUS
  Administered 2011-05-06: 50 mg via INTRAVENOUS

## 2011-05-06 MED ORDER — MORPHINE SULFATE 4 MG/ML IJ SOLN: 0.0500 mg/kg | INTRAMUSCULAR | Status: DC | PRN

## 2011-05-06 MED ORDER — SODIUM CHLORIDE 0.9 % IV SOLN
INTRAVENOUS | Status: DC | PRN
  Administered 2011-05-06: 11:00:00 via INTRAVENOUS

## 2011-05-06 MED ORDER — HYDROMORPHONE 0.3 MG/ML IV SOLN
INTRAVENOUS | Status: DC
  Administered 2011-05-06: 1.2 mg via INTRAVENOUS
  Administered 2011-05-06: 13:00:00 via INTRAVENOUS

## 2011-05-06 MED ORDER — DIPHENHYDRAMINE HCL 50 MG/ML IJ SOLN: 12.5000 mg | Freq: Four times a day (QID) | INTRAMUSCULAR | Status: DC | PRN

## 2011-05-06 MED ORDER — DIPHENHYDRAMINE HCL 12.5 MG/5ML PO ELIX
12.5000 mg | ORAL_SOLUTION | Freq: Four times a day (QID) | ORAL | Status: DC | PRN
  Filled 2011-05-06: qty 5

## 2011-05-06 MED ORDER — CEFAZOLIN SODIUM 1-5 GM-% IV SOLN
INTRAVENOUS | Status: AC
  Filled 2011-05-06: qty 100

## 2011-05-06 MED ORDER — PROPOFOL 10 MG/ML IV EMUL
INTRAVENOUS | Status: DC | PRN
  Administered 2011-05-06: 200 mg via INTRAVENOUS

## 2011-05-06 MED ORDER — SODIUM CHLORIDE 0.9 % IJ SOLN: 9.0000 mL | INTRAMUSCULAR | Status: DC | PRN

## 2011-05-06 MED ORDER — ACETAMINOPHEN 10 MG/ML IV SOLN
1000.0000 mg | Freq: Four times a day (QID) | INTRAVENOUS | Status: AC
  Administered 2011-05-06 – 2011-05-07 (×4): 1000 mg via INTRAVENOUS
  Filled 2011-05-06 (×4): qty 100

## 2011-05-06 MED ORDER — ONDANSETRON HCL 4 MG/2ML IJ SOLN: 4.0000 mg | Freq: Once | INTRAMUSCULAR | Status: DC | PRN

## 2011-05-06 MED ORDER — GLYCOPYRROLATE 0.2 MG/ML IJ SOLN
INTRAMUSCULAR | Status: DC | PRN
  Administered 2011-05-06: .6 mg via INTRAVENOUS

## 2011-05-06 MED ORDER — NEOSTIGMINE METHYLSULFATE 1 MG/ML IJ SOLN
INTRAMUSCULAR | Status: DC | PRN
  Administered 2011-05-06: 4 mg via INTRAVENOUS

## 2011-05-06 MED ORDER — POVIDONE-IODINE 10 % EX OINT
TOPICAL_OINTMENT | CUTANEOUS | Status: DC | PRN
  Administered 2011-05-06: 1 via TOPICAL

## 2011-05-06 MED ORDER — MIDAZOLAM HCL 5 MG/5ML IJ SOLN
INTRAMUSCULAR | Status: DC | PRN
  Administered 2011-05-06: 2 mg via INTRAVENOUS

## 2011-05-06 MED ORDER — HYDROMORPHONE HCL PF 1 MG/ML IJ SOLN
0.2500 mg | INTRAMUSCULAR | Status: DC | PRN
  Administered 2011-05-06 (×3): 0.5 mg via INTRAVENOUS

## 2011-05-06 MED ORDER — LIDOCAINE HCL (CARDIAC) 20 MG/ML IV SOLN
INTRAVENOUS | Status: DC | PRN
  Administered 2011-05-06: 80 mg via INTRAVENOUS

## 2011-05-06 MED ORDER — LACTATED RINGERS IV SOLN
INTRAVENOUS | Status: DC | PRN
  Administered 2011-05-06: 10:00:00 via INTRAVENOUS

## 2011-05-06 MED ORDER — PANTOPRAZOLE SODIUM 40 MG IV SOLR
40.0000 mg | INTRAVENOUS | Status: DC
Start: 2011-05-06 — End: 2011-05-13
  Administered 2011-05-06 – 2011-05-12 (×7): 40 mg via INTRAVENOUS
  Filled 2011-05-06 (×8): qty 40

## 2011-05-06 MED ORDER — CEFAZOLIN SODIUM-DEXTROSE 2-3 GM-% IV SOLR
2.0000 g | INTRAVENOUS | Status: AC
  Administered 2011-05-06: 2 g via INTRAVENOUS
  Filled 2011-05-06: qty 50

## 2011-05-06 MED ORDER — 0.9 % SODIUM CHLORIDE (POUR BTL) OPTIME
TOPICAL | Status: DC | PRN
  Administered 2011-05-06: 4000 mL

## 2011-05-06 MED ORDER — NALOXONE HCL 0.4 MG/ML IJ SOLN
0.4000 mg | INTRAMUSCULAR | Status: DC | PRN
  Administered 2011-05-06: 0.4 mg via INTRAVENOUS
  Filled 2011-05-06: qty 1

## 2011-05-06 MED ORDER — ONDANSETRON HCL 4 MG/2ML IJ SOLN
INTRAMUSCULAR | Status: DC | PRN
  Administered 2011-05-06: 4 mg via INTRAVENOUS

## 2011-05-06 SURGICAL SUPPLY — 63 items
BLADE SURG ROTATE 9660 (MISCELLANEOUS) ×4 IMPLANT
CANISTER SUCTION 2500CC (MISCELLANEOUS) ×3 IMPLANT
CATH FOLEY 2WAY SLVR  5CC 20FR (CATHETERS)
CATH FOLEY 2WAY SLVR  5CC 22FR (CATHETERS)
CATH FOLEY 2WAY SLVR 5CC 20FR (CATHETERS) IMPLANT
CATH FOLEY 2WAY SLVR 5CC 22FR (CATHETERS) IMPLANT
CLIP TI LARGE 6 (CLIP) ×2 IMPLANT
CLOTH BEACON ORANGE TIMEOUT ST (SAFETY) ×3 IMPLANT
COVER SURGICAL LIGHT HANDLE (MISCELLANEOUS) ×3 IMPLANT
DRAIN PENROSE 1/2X36 STERILE (WOUND CARE) ×3 IMPLANT
DRAPE LAPAROSCOPIC ABDOMINAL (DRAPES) ×3 IMPLANT
DRAPE UTILITY 15X26 W/TAPE STR (DRAPE) ×6 IMPLANT
DRAPE WARM FLUID 44X44 (DRAPE) ×3 IMPLANT
ELECT BLADE 6.5 EXT (BLADE) ×3 IMPLANT
ELECT CAUTERY BLADE 6.4 (BLADE) ×3 IMPLANT
ELECT REM PT RETURN 9FT ADLT (ELECTROSURGICAL) ×3
ELECTRODE REM PT RTRN 9FT ADLT (ELECTROSURGICAL) ×2 IMPLANT
GAUZE SPONGE 4X4 12PLY STRL LF (GAUZE/BANDAGES/DRESSINGS) ×2 IMPLANT
GLOVE BIO SURGEON STRL SZ7.5 (GLOVE) ×2 IMPLANT
GLOVE BIOGEL PI IND STRL 7.0 (GLOVE) ×1 IMPLANT
GLOVE BIOGEL PI IND STRL 8 (GLOVE) ×3 IMPLANT
GLOVE BIOGEL PI INDICATOR 7.0 (GLOVE) ×1
GLOVE BIOGEL PI INDICATOR 8 (GLOVE) ×2
GLOVE ECLIPSE 7.5 STRL STRAW (GLOVE) ×3 IMPLANT
GLOVE SS BIOGEL STRL SZ 8 (GLOVE) ×1 IMPLANT
GLOVE SUPERSENSE BIOGEL SZ 8 (GLOVE) ×1
GLOVE SURG SS PI 6.5 STRL IVOR (GLOVE) ×2 IMPLANT
GOWN PREVENTION PLUS XLARGE (GOWN DISPOSABLE) ×2 IMPLANT
GOWN STRL NON-REIN LRG LVL3 (GOWN DISPOSABLE) ×9 IMPLANT
HEMOSTAT SNOW SURGICEL 2X4 (HEMOSTASIS) ×2 IMPLANT
HEMOSTAT SURGICEL 2X14 (HEMOSTASIS) ×2 IMPLANT
KIT BASIN OR (CUSTOM PROCEDURE TRAY) ×3 IMPLANT
KIT ROOM TURNOVER OR (KITS) ×3 IMPLANT
LIGASURE IMPACT 36 18CM CVD LR (INSTRUMENTS) ×3 IMPLANT
NS IRRIG 1000ML POUR BTL (IV SOLUTION) ×10 IMPLANT
PACK GENERAL/GYN (CUSTOM PROCEDURE TRAY) ×3 IMPLANT
PAD ARMBOARD 7.5X6 YLW CONV (MISCELLANEOUS) ×3 IMPLANT
RELOAD AUTO 90-3.5 TA90 BLE (ENDOMECHANICALS) IMPLANT
RELOAD AUTO 90-4.8 TA90 GRN (ENDOMECHANICALS) IMPLANT
RELOAD STAPLE 90 BLU REG (ENDOMECHANICALS) IMPLANT
RELOAD STAPLE 90 GRN THCK DST (ENDOMECHANICALS) IMPLANT
SPECIMEN JAR LARGE (MISCELLANEOUS) ×3 IMPLANT
SPONGE GAUZE 4X4 12PLY (GAUZE/BANDAGES/DRESSINGS) ×3 IMPLANT
SPONGE LAP 18X18 X RAY DECT (DISPOSABLE) ×2 IMPLANT
STAPLER 90 3.5 STAND SLIM (STAPLE)
STAPLER 90 3.5 STD SLIM (STAPLE) IMPLANT
STAPLER TA90 4.8 THK SLIMI (STAPLE) ×2 IMPLANT
STAPLER VISISTAT 35W (STAPLE) ×3 IMPLANT
SUCTION POOLE TIP (SUCTIONS) ×3 IMPLANT
SUT PDS AB 1 TP1 96 (SUTURE) ×6 IMPLANT
SUT SILK 2 0 SH CR/8 (SUTURE) ×3 IMPLANT
SUT SILK 2 0 TIES 10X30 (SUTURE) ×3 IMPLANT
SUT SILK 2 0SH CR/8 30 (SUTURE) ×8 IMPLANT
SUT SILK 3 0 SH CR/8 (SUTURE) ×3 IMPLANT
SUT SILK 3 0 TIES 10X30 (SUTURE) ×3 IMPLANT
TAPE CLOTH SURG 4X10 WHT LF (GAUZE/BANDAGES/DRESSINGS) ×2 IMPLANT
TAPE CLOTH SURG 6X10 WHT LF (GAUZE/BANDAGES/DRESSINGS) ×2 IMPLANT
TOWEL OR 17X24 6PK STRL BLUE (TOWEL DISPOSABLE) ×3 IMPLANT
TOWEL OR 17X26 10 PK STRL BLUE (TOWEL DISPOSABLE) ×3 IMPLANT
TRAY FOLEY CATH 14FRSI W/METER (CATHETERS) ×3 IMPLANT
TUBE MOSS GAS 18FR (TUBING) IMPLANT
WATER STERILE IRR 1000ML POUR (IV SOLUTION) IMPLANT
YANKAUER SUCT BULB TIP NO VENT (SUCTIONS) ×3 IMPLANT

## 2011-05-06 NOTE — ED Provider Notes (Signed)
Medical screening examination/treatment/procedure(s) were conducted as a shared visit with non-physician practitioner(s) and myself.  I personally evaluated the patient during the encounter  Toy Baker, MD 05/06/11 [phone]

## 2011-05-06 NOTE — Progress Notes (Signed)
I spoke with Mr. John Keller in detail about his presentation, his current problem, and the need for a partial gastrectomy.  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  Wedge resection is in order.  Will be able to add on to surgery today.  Will discuss with primary team.  Marta Lamas. Gae Bon, MD, FACS [phone] [facility]

## 2011-05-06 NOTE — Preoperative (Signed)
Beta Blockers   Reason not to administer Beta Blockers:Not Applicable 

## 2011-05-06 NOTE — Anesthesia Postprocedure Evaluation (Signed)
  Anesthesia Post-op Note  Patient: John Keller  Procedure(s) Performed: Procedure(s) (LRB): PARTIAL GASTRECTOMY (N/A)  Patient Location: PACU  Anesthesia Type: General  Level of Consciousness: awake and alert   Airway and Oxygen Therapy: Patient Spontanous Breathing and Patient connected to nasal cannula oxygen  Post-op Pain: moderate  Post-op Assessment: Post-op Vital signs reviewed, Patient's Cardiovascular Status Stable, Respiratory Function Stable, Patent Airway, No signs of Nausea or vomiting and Pain level controlled  Post-op Vital Signs: Reviewed and stable  Complications: No apparent anesthesia complications

## 2011-05-06 NOTE — Progress Notes (Signed)
We did not see the patient today, as he was in surgery for resection of gastric tumor.  This is most-likely the etiology for his presenting symptoms that were thought atypical for ACS.  I have not seen and Echocardiogram on him (may have missed exam time due to GI procedure yesterday & OR today).  We will wait for the echo to be done & read & provide further recommendations at that time.  Marykay Lex, M.D., M.S. [facility] [address] [place], [place]  [address]  [phone]  05/06/2011 5:01 PM

## 2011-05-06 NOTE — Progress Notes (Signed)
Pt respirations noted to be 6-11. 0.4mg  narcan given respirations increased to 14-19

## 2011-05-06 NOTE — Op Note (Signed)
OPERATIVE REPORT  DATE OF OPERATION: 05/04/2011 - 05/06/2011  PATIENT:  John Keller  56 y.o. male  PRE-OPERATIVE DIAGNOSIS:  bleeding leiomyoma  POST-OPERATIVE DIAGNOSIS:  bleeding leiomyoma  PROCEDURE:  Procedure(s): PARTIAL GASTRECTOMY  SURGEON:  Surgeon(s): Frederik Schmidt, MD Caleen Essex III, MD  ASSISTANT: Carolynne Edouard  ANESTHESIA:   general  EBL: 700 ml  BLOOD ADMINISTERED: 600 CC PRBC  DRAINS: Nasogastric Tube and Urinary Catheter (Foley)   SPECIMEN:  Source of Specimen:  proximal posterior gastric wall  COUNTS CORRECT:  YES  PROCEDURE DETAILS: Patient was taken to the operating room and placed on the table in the supine position. After an adequate general endotracheal anesthetic was administered he was prepped and draped in usual sterile manner exposing his entire abdomen.  After proper time out was performed identifying the patient and the procedure to be performed an upper midline incision was made from the xiphoid down to the umbilicus. Was taken down to and through the midline fascia using electrocautery. Hemostasis was attained using electrocautery.  Once we entered the peritoneal cavity and the Omni retractor was set up in order to retract in the right upper quadrant and the left upper quadrant at the patient was placed in the reverse Trendelenburg position.  Upon palpating the stomach the tumor could be palpated in the upper posterior portion of the stomach. We went into the lesser sac by opening into the omentum between the transverse colon and the greater curvature of the stomach. A LigaSure device was used to down the omentum as we marched up the greater curvature towards the posterior wall upper portion of the stomach. Once we got to the upper one third of the stomach stay sutures were placed on the size of the tumor laterally and medially a period while providing traction on the stomach in order to get adequate access a tear of one short gastric vessels was obtained. Upon  doing this we had to mobilize the spleen laterally and also the splenic flexure of the colon in order to get adequate control of the bleeding. In the process also the inferior posterior and lateral aspect of the splenic capsule was torn which caused some bleeding also. In total about 4-700 cc of blood loss was attained during this process. We controlled the splenic capsule tear with with Surgicel snow and PACs. The short gastric vessel in the upper portion of the greater curvature of the stomach was controlled with suture ligatures of 2-0 silk.  The tumors resected by cutting into the posterior superior wall of the stomach using electrocautery using an Allis clamp in order to hold onto the edges of the mucosa [place] resected this leiomyoma. We subsequently closed wall using ATX 90 stapler however the upper quarter of the stomach staple line did not come together nicely. Because of this we oversewed the staple line using interrupted 2-0 silk Lambert stitches closing of the entire staple line especially the upper posterior corner.  In the process of bleeding was controlled we irrigated with saline the patient did receive 2 units of packed cells. He never did drop his blood pressure require any pressors during the case. He tube was palpated to be in adequate position just above the suture line. It was maintained on suction. Once we had adequate control we irrigated with saline solution removed all packs and closed the abdomen using running looped #1 PDS suture. The subcutaneous tissue was irrigated with saline solution and closed the skin using stainless steel staples. All counts were  correct including needles sponges and instruments.  PATIENT DISPOSITION:  PACU - hemodynamically stable.   Joanathan Affeldt III,Denzell Colasanti O 3/8/201312:17 PM

## 2011-05-06 NOTE — Transfer of Care (Signed)
Immediate Anesthesia Transfer of Care Note  Patient: John Keller  Procedure(s) Performed: Procedure(s) (LRB): PARTIAL GASTRECTOMY (N/A)  Patient Location: PACU  Anesthesia Type: General  Level of Consciousness: awake and alert   Airway & Oxygen Therapy: Patient Spontanous Breathing and Patient connected to nasal cannula oxygen  Post-op Assessment: Report given to PACU RN  Post vital signs: Reviewed  Complications: No apparent anesthesia complications

## 2011-05-06 NOTE — Progress Notes (Signed)
[facility] Inpatient Progress Note  05/06/2011  Subjective: Pt has decided to proceed with partial gastrectomy to treat and eval the posterior wall tumor.  Pt had some minor questions that were answered.  He is overall reporting that he feels well.   Objective:  Vital signs in last 24 hours: Filed Vitals:   05/05/11 1540 05/05/11 1550 05/05/11 2100 05/06/11 0500  BP: 152/91 153/98 133/82 125/81  Pulse:   79 80  Temp:   98.1 F (36.7 C) 97.9 F (36.6 C)  TempSrc:   Oral Oral  Resp: 14 14 20 20   Height:      Weight:      SpO2: 96% 97% 99% 100%   Weight change:   Intake/Output Summary (Last 24 hours) at 05/06/11 0839 Last data filed at 05/05/11 1900  Gross per 24 hour  Intake    600 ml  Output      0 ml  Net    600 ml    Review of Systems As in subjective above, otherwise all reviewed and negative  Physical Exam Gen - awake, alert, NAD, cooperative HEENT - NCAT, MMM Neck - supple, no JVD, trachea midline CV - normal s1, s2 sounds Abd - soft, +BS, nondistended EXT - no masses  Lab Results: Results for orders placed during the hospital encounter of 05/04/11 (from the past 24 hour(s))  CBC     Status: Abnormal   Collection Time   05/06/11  5:55 AM      Component Value Range   WBC 9.9  4.0 - 10.5 (K/uL)   RBC 3.07 (*) 4.22 - 5.81 (MIL/uL)   Hemoglobin 9.1 (*) 13.0 - 17.0 (g/dL)   HCT 16.1 (*) 09.6 - 52.0 (%)   MCV 88.6  78.0 - 100.0 (fL)   MCH 29.6  26.0 - 34.0 (pg)   MCHC 33.5  30.0 - 36.0 (g/dL)   RDW 04.5  40.9 - 81.1 (%)   Platelets 167  150 - 400 (K/uL)  COMPREHENSIVE METABOLIC PANEL     Status: Abnormal   Collection Time   05/06/11  5:55 AM      Component Value Range   Sodium 143  135 - 145 (mEq/L)   Potassium 3.9  3.5 - 5.1 (mEq/L)   Chloride 113 (*) 96 - 112 (mEq/L)   CO2 25  19 - 32 (mEq/L)   Glucose, Bld 82  70 - 99 (mg/dL)   BUN 21  6 - 23 (mg/dL)   Creatinine, Ser 9.14  0.50 - 1.35 (mg/dL)   Calcium 7.5 (*) 8.4 - 10.5 (mg/dL)   Total  Protein 5.2 (*) 6.0 - 8.3 (g/dL)   Albumin 2.5 (*) 3.5 - 5.2 (g/dL)   AST 13  0 - 37 (U/L)   ALT 8  0 - 53 (U/L)   Alkaline Phosphatase 53  39 - 117 (U/L)   Total Bilirubin 0.4  0.3 - 1.2 (mg/dL)   GFR calc non Af Amer >90  >90 (mL/min)   GFR calc Af Amer >90  >90 (mL/min)  PROTIME-INR     Status: Normal   Collection Time   05/06/11  5:55 AM      Component Value Range   Prothrombin Time 15.0  11.6 - 15.2 (seconds)   INR 1.16  0.00 - 1.49   PREPARE RBC (CROSSMATCH)     Status: Normal   Collection Time   05/06/11  7:55 AM      Component Value Range   Order Confirmation  ORDER PROCESSED BY BLOOD BANK      Micro Results: No results found for this or any previous visit (from the past 240 hour(s)).  Studies/Results: Dg Chest 2 View  05/04/2011  *RADIOLOGY REPORT*  Clinical Data: Shortness of breath.  Recent upper respiratory tract infection.  CHEST - 2 VIEW  Comparison: Portal chest 05/21/2005.  Findings: The heart is mildly enlarged.  The lungs are clear.  Mild degenerative changes are noted in the thoracic spine.  The visualized soft tissues and bony thorax are unremarkable.  IMPRESSION: Borderline cardiomegaly without failure.  Original Report Authenticated By: Jamesetta Orleans. MATTERN, M.D.   Mr Brain Wo Contrast  05/04/2011  *RADIOLOGY REPORT*  Clinical Data: Ataxia.  MRI HEAD WITHOUT CONTRAST  Technique:  Multiplanar, multiecho pulse sequences of the brain and surrounding structures were obtained according to standard protocol without intravenous contrast.  Comparison: None.  Findings: No acute infarct.  No intracranial hemorrhage.  No intracranial mass lesion detected on this unenhanced exam.  Mild atrophy most notable parietal lobe.  No hydrocephalus.  Major intracranial vascular structures are patent.  Cervical medullary junction, pituitary region and pineal region unremarkable.  Mild exophthalmos.  Minimal mucosal thickening ethmoid sinus air cells.  IMPRESSION: No acute infarct.  Mild  atrophy most notable parietal lobe.  Original Report Authenticated By: Fuller Canada, M.D.    Medications:  Scheduled Meds:   .  ceFAZolin (ANCEF) IV  2 g Intravenous 60 min Pre-Op  . pantoprazole  40 mg Oral BID AC  . sodium chloride  3 mL Intravenous Q12H   Continuous Infusions:   . sodium chloride 100 mL/hr at 05/06/11 0148  . DISCONTD: pantoprozole (PROTONIX) infusion 8 mg/hr (05/05/11 0032)   PRN Meds:.acetaminophen, acetaminophen, ondansetron (ZOFRAN) IV, ondansetron, DISCONTD: butamben-tetracaine-benzocaine, DISCONTD: fentaNYL, DISCONTD: midazolam  Assessment/Plan:  GI bleed secondary to gastric tumor - Pt for partial gastrectomy today.  Will check with surgery to see if they are willing to take over patient's care after surgery since he has no outstanding medical problems at this time.   Upper GI bleed   Elevated BP - have come back down this morning to normal on no medications, no history of hypertension reported.      LOS: 2 days   Meredith Kilbride 05/06/2011, 8:39 AM   Cleora Fleet, MD, CDE, FAAFP [facility] [place], [place]  [phone]

## 2011-05-06 NOTE — Progress Notes (Signed)
      Gi Daily Rounding Note 05/06/2011, 9:07 AM  SUBJECTIVE:      Patient has gone to surgery, not in room.      OBJECTIVE:        General  Not seen     Vital signs in last 24 hours:    Temp:  [97.9 F (36.6 C)-98.8 F (37.1 C)] 97.9 F (36.6 C) (03/08 0500) Pulse Rate:  [79-80] 80  (03/08 0500) Resp:  [11-24] 20  (03/08 0500) BP: (125-169)/(78-98) 125/81 mmHg (03/08 0500) SpO2:  [96 %-100 %] 100 % (03/08 0500) Last BM Date: 05/04/11   Intake/Output from previous day: 03/07 0701 - 03/08 0700 In: 600 [P.O.:600] Out: -   Intake/Output this shift:    Lab Results:  Basename 05/06/11 0555 05/05/11 0528 05/04/11 1455  WBC 9.9 12.2* 15.7*  HGB 9.1* 9.6* 9.8*  HCT 27.2* 28.7* 29.1*  PLT 167 172 184   BMET  Basename 05/06/11 0555 05/04/11 1455  NA 143 141  K 3.9 4.3  CL 113* 110  CO2 25 26  GLUCOSE 82 100*  BUN 21 54*  CREATININE 0.92 1.11  CALCIUM 7.5* 8.6   LFT  Basename 05/06/11 0555  PROT 5.2*  ALBUMIN 2.5*  AST 13  ALT 8  ALKPHOS 53  BILITOT 0.4  BILIDIR --  IBILI --   PT/INR  Basename 05/06/11 0555  LABPROT 15.0  INR 1.16   Studies/Results:   ASSESMENT: 1.  Anemia, heme positive stool  EGD yesterday with 3 CM submucosal mass.  R/o GIST vs. Leiomyoma.  Surgery planning resection today.  S/p transfusion with one unit PRBC, Hgb down less than one gram overall.    PLAN: 1.  Will sign off. Continue BID Protonix for the present.  After surgery, PPI therapy per Dr Lindie Spruce.    LOS: 2 days   Jennye Moccasin  05/06/2011, 9:07 AM Pager: [phone]   GI ATTENDING  AS ABOVE . WE ARE AVAILABLE PRN. WILL SIGN OFF.  Wilhemina Bonito. Eda Keys., M.D. [facility] Division of Gastroenterology

## 2011-05-06 NOTE — Progress Notes (Signed)
Pt diaphoretic and skin is cool to the touch. Dr. Lindie Spruce paged. MD stated he would come and see the patient. Will continue to monitor patient

## 2011-05-06 NOTE — Anesthesia Preprocedure Evaluation (Signed)
Anesthesia Evaluation  Patient identified by MRN, date of birth, ID band Patient awake    Reviewed: Allergy & Precautions, H&P , NPO status , Patient's Chart, lab work & pertinent test results  Airway Mallampati: I TM Distance: >3 FB Neck ROM: Full    Dental  (+) Teeth Intact and Dental Advisory Given   Pulmonary  breath sounds clear to auscultation        Cardiovascular Rhythm:Regular Rate:Normal     Neuro/Psych    GI/Hepatic   Endo/Other  Morbid obesity  Renal/GU      Musculoskeletal   Abdominal   Peds  Hematology   Anesthesia Other Findings   Reproductive/Obstetrics                           Anesthesia Physical Anesthesia Plan  ASA: II  Anesthesia Plan: General   Post-op Pain Management:    Induction: Intravenous  Airway Management Planned: Oral ETT  Additional Equipment:   Intra-op Plan:   Post-operative Plan: Extubation in OR  Informed Consent: I have reviewed the patients History and Physical, chart, labs and discussed the procedure including the risks, benefits and alternatives for the proposed anesthesia with the patient or authorized representative who has indicated his/her understanding and acceptance.   Dental advisory given  Plan Discussed with: Anesthesiologist, Surgeon and CRNA  Anesthesia Plan Comments:         Anesthesia Quick Evaluation

## 2011-05-07 LAB — CARDIAC PANEL(CRET KIN+CKTOT+MB+TROPI)
Relative Index: INVALID (ref 0.0–2.5)
Relative Index: INVALID (ref 0.0–2.5)
Total CK: 66 U/L (ref 7–232)
Total CK: 72 U/L (ref 7–232)

## 2011-05-07 LAB — CBC
MCH: 30 pg (ref 26.0–34.0)
MCV: 87.3 fL (ref 78.0–100.0)
Platelets: 168 10*3/uL (ref 150–400)
RBC: 3.47 MIL/uL — ABNORMAL LOW (ref 4.22–5.81)

## 2011-05-07 LAB — BASIC METABOLIC PANEL
CO2: 27 mEq/L (ref 19–32)
Calcium: 7.8 mg/dL — ABNORMAL LOW (ref 8.4–10.5)
Creatinine, Ser: 0.92 mg/dL (ref 0.50–1.35)
Glucose, Bld: 136 mg/dL — ABNORMAL HIGH (ref 70–99)

## 2011-05-07 MED ORDER — HYDROMORPHONE 0.3 MG/ML IV SOLN
INTRAVENOUS | Status: AC
  Administered 2011-05-07: 0.3 mL via INTRAVENOUS
  Filled 2011-05-07: qty 25

## 2011-05-07 MED ORDER — HYDROMORPHONE 0.3 MG/ML IV SOLN
INTRAVENOUS | Status: AC
  Administered 2011-05-07: 2.1 mg via INTRAVENOUS
  Filled 2011-05-07: qty 25

## 2011-05-07 NOTE — Progress Notes (Signed)
He seems comfortable so far.Will need several days of n/g

## 2011-05-07 NOTE — Progress Notes (Signed)
1 Day Post-Op  Subjective: Pain is under control with PCA. He feels like he may have passed a little flatus.  Objective: Vital signs in last 24 hours: Temp:  [97.4 F (36.3 C)-99.2 F (37.3 C)] 99.2 F (37.3 C) (03/09 0800) Pulse Rate:  [68-103] 98  (03/09 0800) Resp:  [4-22] 16  (03/09 0820) BP: (116-171)/(64-93) 116/64 mmHg (03/09 0800) SpO2:  [40 %-100 %] 100 % (03/09 0820) Last BM Date: 05/04/11  Intake/Output from previous day: 03/08 0701 - 03/09 0700 In: 2510 [I.V.:1810; Blood:700] Out: 1045 [Urine:325; Emesis/NG output:20; Blood:700] Intake/Output this shift:    General appearance: alert, cooperative and slowed mentation Resp: clear to auscultation bilaterally Cardio: regular rate and rhythm and slightly tachycardic GI: No BS, minimal heme drainage on OP dressings, non distended, tender about incision as expected  Lab Results:   Basename 05/07/11 0352 05/06/11 1346  WBC 15.9* 24.5*  HGB 10.4* 11.2*  HCT 30.3* 32.9*  PLT 168 174   BMET  Basename 05/07/11 0352 05/06/11 0555  NA 140 143  K 4.6 3.9  CL 108 113*  CO2 27 25  GLUCOSE 136* 82  BUN 19 21  CREATININE 0.92 0.92  CALCIUM 7.8* 7.5*   PT/INR  Basename 05/06/11 0555  LABPROT 15.0  INR 1.16   ABG No results found for this basename: PHART:2,PCO2:2,PO2:2,HCO3:2 in the last 72 hours  Studies/Results: No results found.  Anti-infectives: Anti-infectives     Start     Dose/Rate Route Frequency Ordered Stop   05/06/11 0759   ceFAZolin (ANCEF) IVPB 2 g/50 mL premix        2 g 100 mL/hr over 30 Minutes Intravenous 60 min pre-op 05/06/11 0759 05/06/11 1030          Assessment/Plan: s/p Procedure(s) (LRB): PARTIAL GASTRECTOMY (N/A) d/c foley Continue NGT Up to chair today CBC in am  LOS: 3 days    Kadden Osterhout,PA-C Pager [phone] General Trauma Pager [phone]

## 2011-05-07 NOTE — Progress Notes (Signed)
POST OP DAY #1   PARTIAL GASTRECTOMY for bleeding leiomyoma   Subjective: No specific complaints, denies SOB and chest pain.  Objective: Vital signs in last 24 hours: Temp:  [97.4 F (36.3 C)-99.2 F (37.3 C)] 98.9 F (37.2 C) (03/09 1200) Pulse Rate:  [72-103] 97  (03/09 1200) Resp:  [12-20] 20  (03/09 1200) BP: (116-171)/(64-93) 120/67 mmHg (03/09 1200) SpO2:  [40 %-100 %] 99 % (03/09 1200) Weight change:  Last BM Date: 05/04/11 Intake/Output from previous day: +1465 03/08 0701 - 03/09 0700 In: 2510 [I.V.:1810; Blood:700] Out: 1045 [Urine:325; Emesis/NG output:20; Blood:700] Intake/Output this shift:    PE: General:alert, oriented, up in chair, somewhat flat affect Heart:S1S2 RRR ? S4 gallup, soft systolic murmur Lungs:clear ZOX:WRUE, ng in place.  Ext:no edema Neuro:answers questions approp   Lab Results:  Basename 05/07/11 0352 05/06/11 1346  WBC 15.9* 24.5*  HGB 10.4* 11.2*  HCT 30.3* 32.9*  PLT 168 174   BMET  Basename 05/07/11 0352 05/06/11 0555  NA 140 143  K 4.6 3.9  CL 108 113*  CO2 27 25  GLUCOSE 136* 82  BUN 19 21  CREATININE 0.92 0.92  CALCIUM 7.8* 7.5*    Basename 05/07/11 0351 05/06/11 2158  TROPONINI <0.30 <0.30    No results found for this basename: CHOL, HDL, LDLCALC, LDLDIRECT, TRIG, CHOLHDL   No results found for this basename: HGBA1C     Lab Results  Component Value Date   TSH 3.405 05/06/2011    Hepatic Function Panel  Basename 05/06/11 0555  PROT 5.2*  ALBUMIN 2.5*  AST 13  ALT 8  ALKPHOS 53  BILITOT 0.4  BILIDIR --  IBILI --   No results found for this basename: CHOL in the last 72 hours No results found for this basename: PROTIME in the last 72 hours    EKG: Orders placed during the hospital encounter of 05/04/11  . EKG 12-LEAD  . EKG 12-LEAD    Studies/Results: No results found.  Medications: I have reviewed the patient's current medications.    Marland Kitchen acetaminophen  1,000 mg Intravenous Q6H  .  HYDROmorphone PCA 0.3 mg/mL   Intravenous Q4H  . pantoprazole (PROTONIX) IV  40 mg Intravenous Q24H  . sodium chloride  3 mL Intravenous Q12H  . DISCONTD: HYDROmorphone PCA 0.3 mg/mL   Intravenous Q4H  . DISCONTD: pantoprazole  40 mg Oral BID AC   Assessment/Plan: Patient Active Problem List  Diagnoses  . GIB (gastrointestinal bleeding)  . Abnormal EKG  . Tachycardia  . Leukocytosis  . Nonspecific abnormal finding in stool contents  . Acute posthemorrhagic anemia  . Gastric mass   PLAN:Leukocytosis improving. VS stable  Brief info:This is a 56 y.o. male with a past medical history significant for the absence of known cardiac illness or of coronary risk factors other than family history of non-premature CAD (no DM, HTN, smoking or hyperlipidemia).  He presented with severe weakness and dizziness: he spent the whole night on the floor. He did not experience syncope but was too weak to get up. Denies any chest pain dyspnea or palpitations. No focal neurological symptoms. No edema, orthopnea or hemoptysis or cough. Has had a "cold" for several days. Thinks he is exhausted from working two jobs.  He was mildly hypotensive and tachycardic. Moderate anemia and moderate leukocytosis, markedly elevated BUN with normal creatinine. Inferior T wave changes are seen without ST elevation or depression. POC cTnI is marginally elevated, but susequent troponins have been negative. I believe  this is likely false positive. Given risk factors, may consider outpatient NST.  I do not find that echo was ever done, was cancelled by Brayton El, PA with [facility].. ? Conflict with timing of surgery?   ? Need to reorder now or outpatient?    LOS: 3 days   INGOLD,LAURA R 05/07/2011, 1:36 PM  I have seen & examined Mr. Mccartin today along with Nada Boozer, NP.   I am in agreement with her note.  It would seem that his gastric mass is the most likely source for chest symptoms & the initial POC trponin elevation with all  remaining labs normal is not consistent with ACS.  As he tolerated the surgery without cardiac complications, ACS is even less likley.  He does have risk factors & the question is not "fully" answered, therefore, I would recommend OP f/u with Dr. Rennis Golden after OP Echo & NST since I would be reluctant to proceed with any invasive evalutation / treatment (which would include anticoagulation) this close post-operatively.  [facility] will sign off for now.  Please do not hesitate to contact us if we can be of further assistance. Also please notify our office at the time of discharge, so we can be of assistance with arranging follow-up.  Marykay Lex, M.D., M.S. [facility] [address] [place], [place]  [address]  [phone]  05/07/2011 3:43 PM

## 2011-05-07 NOTE — Progress Notes (Signed)
[facility] Inpatient Progress Note  05/07/2011  Subjective: Pt lying in bed in no distress, tolerated surgery well, no complaints, pain controlled with PCA.   Objective:  Vital signs in last 24 hours: Filed Vitals:   05/07/11 0436 05/07/11 0600 05/07/11 0800 05/07/11 0820  BP:  118/70 116/64   Pulse:  97 98   Temp:  97.5 F (36.4 C) 99.2 F (37.3 C)   TempSrc:   Oral   Resp: 19 14 20 16   Height:      Weight:      SpO2: 40% 100% 99% 100%   Weight change:   Intake/Output Summary (Last 24 hours) at 05/07/11 0845 Last data filed at 05/06/11 1430  Gross per 24 hour  Intake   2510 ml  Output   1045 ml  Net   1465 ml    Review of Systems As above, otherwise all reviewed and reported negative  Physical Exam Gen - awake, alert, NAD, cooperative  HEENT - NCAT, MMM  Neck - supple, no JVD, trachea midline  CV - normal s1, s2 sounds  Abd - midline dressing clean and dry, minimal BS EXT - SCDs bilateral LEs  Neuro - nonfocal  Lab Results: Results for orders placed during the hospital encounter of 05/04/11 (from the past 24 hour(s))  CBC     Status: Abnormal   Collection Time   05/06/11  1:46 PM      Component Value Range   WBC 24.5 (*) 4.0 - 10.5 (K/uL)   RBC 3.77 (*) 4.22 - 5.81 (MIL/uL)   Hemoglobin 11.2 (*) 13.0 - 17.0 (g/dL)   HCT 29.5 (*) 62.1 - 52.0 (%)   MCV 87.3  78.0 - 100.0 (fL)   MCH 29.7  26.0 - 34.0 (pg)   MCHC 34.0  30.0 - 36.0 (g/dL)   RDW 30.8  65.7 - 84.6 (%)   Platelets 174  150 - 400 (K/uL)  GLUCOSE, CAPILLARY     Status: Abnormal   Collection Time   05/06/11  4:03 PM      Component Value Range   Glucose-Capillary 181 (*) 70 - 99 (mg/dL)   Comment 1 Notify RN    CARDIAC PANEL(CRET KIN+CKTOT+MB+TROPI)     Status: Normal   Collection Time   05/06/11  4:04 PM      Component Value Range   Total CK 71  7 - 232 (U/L)   CK, MB 2.4  0.3 - 4.0 (ng/mL)   Troponin I <0.30  <0.30 (ng/mL)   Relative Index RELATIVE INDEX IS INVALID  0.0 - 2.5   CARDIAC  PANEL(CRET KIN+CKTOT+MB+TROPI)     Status: Normal   Collection Time   05/06/11  9:58 PM      Component Value Range   Total CK 66  7 - 232 (U/L)   CK, MB 2.3  0.3 - 4.0 (ng/mL)   Troponin I <0.30  <0.30 (ng/mL)   Relative Index RELATIVE INDEX IS INVALID  0.0 - 2.5   CARDIAC PANEL(CRET KIN+CKTOT+MB+TROPI)     Status: Normal   Collection Time   05/07/11  3:51 AM      Component Value Range   Total CK 72  7 - 232 (U/L)   CK, MB 2.1  0.3 - 4.0 (ng/mL)   Troponin I <0.30  <0.30 (ng/mL)   Relative Index RELATIVE INDEX IS INVALID  0.0 - 2.5   CBC     Status: Abnormal   Collection Time   05/07/11  3:52  AM      Component Value Range   WBC 15.9 (*) 4.0 - 10.5 (K/uL)   RBC 3.47 (*) 4.22 - 5.81 (MIL/uL)   Hemoglobin 10.4 (*) 13.0 - 17.0 (g/dL)   HCT 16.1 (*) 09.6 - 52.0 (%)   MCV 87.3  78.0 - 100.0 (fL)   MCH 30.0  26.0 - 34.0 (pg)   MCHC 34.3  30.0 - 36.0 (g/dL)   RDW 04.5  40.9 - 81.1 (%)   Platelets 168  150 - 400 (K/uL)  BASIC METABOLIC PANEL     Status: Abnormal   Collection Time   05/07/11  3:52 AM      Component Value Range   Sodium 140  135 - 145 (mEq/L)   Potassium 4.6  3.5 - 5.1 (mEq/L)   Chloride 108  96 - 112 (mEq/L)   CO2 27  19 - 32 (mEq/L)   Glucose, Bld 136 (*) 70 - 99 (mg/dL)   BUN 19  6 - 23 (mg/dL)   Creatinine, Ser 9.14  0.50 - 1.35 (mg/dL)   Calcium 7.8 (*) 8.4 - 10.5 (mg/dL)   GFR calc non Af Amer >90  >90 (mL/min)   GFR calc Af Amer >90  >90 (mL/min)   Micro Results: Recent Results (from the past 240 hour(s))  SURGICAL PCR SCREEN     Status: Normal   Collection Time   05/06/11  8:02 AM      Component Value Range Status Comment   MRSA, PCR NEGATIVE  NEGATIVE  Final    Staphylococcus aureus NEGATIVE  NEGATIVE  Final     Studies/Results: No results found.  Medications:  Scheduled Meds:   . acetaminophen  1,000 mg Intravenous Q6H  .  ceFAZolin (ANCEF) IV  2 g Intravenous 60 min Pre-Op  . HYDROmorphone PCA 0.3 mg/mL   Intravenous Q4H  . pantoprazole (PROTONIX)  IV  40 mg Intravenous Q24H  . sodium chloride  3 mL Intravenous Q12H  . DISCONTD: HYDROmorphone PCA 0.3 mg/mL   Intravenous Q4H  . DISCONTD: pantoprazole  40 mg Oral BID AC   Continuous Infusions:   . sodium chloride 100 mL/hr at 05/06/11 1614  . DISCONTD: lactated ringers 50 mL/hr at 05/06/11 0909   PRN Meds:.acetaminophen, acetaminophen, diphenhydrAMINE, diphenhydrAMINE, naloxone, ondansetron (ZOFRAN) IV, ondansetron, sodium chloride, DISCONTD: 0.9 % irrigation (POUR BTL), DISCONTD: hemostatic agents, DISCONTD: hemostatic agents, DISCONTD: HYDROmorphone, DISCONTD: morphine, DISCONTD: ondansetron (ZOFRAN) IV, DISCONTD: povidone-iodine  Assessment/Plan:  1.  Bleeding Leiomyoma of stomach, POD 1 s/p partial gastrectomy - post op management per surgery team     2.  Leukocytosis - WBC has elevated since surgery, before surgery had returned to normal.    LOS: 3 days   Kanishk Stroebel 05/07/2011, 8:45 AM   Cleora Fleet, MD, CDE, FAAFP [facility] [place], [place]  [phone]

## 2011-05-08 LAB — CBC
MCH: 29.9 pg (ref 26.0–34.0)
MCHC: 33.3 g/dL (ref 30.0–36.0)
MCV: 89.7 fL (ref 78.0–100.0)
Platelets: 165 10*3/uL (ref 150–400)
RDW: 15.3 % (ref 11.5–15.5)
WBC: 12.6 10*3/uL — ABNORMAL HIGH (ref 4.0–10.5)

## 2011-05-08 LAB — TYPE AND SCREEN
Antibody Screen: NEGATIVE
Unit division: 0
Unit division: 0

## 2011-05-08 LAB — GLUCOSE, CAPILLARY: Glucose-Capillary: 77 mg/dL (ref 70–99)

## 2011-05-08 MED ORDER — CHLORHEXIDINE GLUCONATE 0.12 % MT SOLN
15.0000 mL | Freq: Two times a day (BID) | OROMUCOSAL | Status: DC
  Administered 2011-05-08 – 2011-05-12 (×9): 15 mL via OROMUCOSAL
  Filled 2011-05-08 (×10): qty 15

## 2011-05-08 MED ORDER — BIOTENE DRY MOUTH MT LIQD
15.0000 mL | Freq: Two times a day (BID) | OROMUCOSAL | Status: DC
  Administered 2011-05-08 – 2011-05-11 (×7): 15 mL via OROMUCOSAL

## 2011-05-08 MED ORDER — HEPARIN SODIUM (PORCINE) 5000 UNIT/ML IJ SOLN
5000.0000 [IU] | Freq: Three times a day (TID) | INTRAMUSCULAR | Status: DC
  Administered 2011-05-08 – 2011-05-15 (×20): 5000 [IU] via SUBCUTANEOUS
  Filled 2011-05-08 (×24): qty 1

## 2011-05-08 NOTE — Progress Notes (Signed)
2 Days Post-Op  Subjective: Passing flatus, oob to chair yesterday, voiding  Objective: Vital signs in last 24 hours: Temp:  [98.9 F (37.2 C)-99.8 F (37.7 C)] 99.8 F (37.7 C) (03/10 0500) Pulse Rate:  [79-118] 79  (03/10 0500) Resp:  [10-24] 15  (03/10 0821) BP: (120-132)/(67-83) 130/83 mmHg (03/10 0500) SpO2:  [95 %-99 %] 98 % (03/10 0821) Last BM Date: 05/04/11  Intake/Output from previous day: 03/09 0701 - 03/10 0700 In: 7964.3 [I.V.:7954.3; IV Piggyback:10] Out: 910 [Urine:900; Emesis/NG output:10] Intake/Output this shift:    General appearance: no distress Resp: diminished breath sounds bibasilar Cardio: rrr GI: incision clean without infection, bs present, appropr tender  Lab Results:   [facility] 05/08/11 0711 05/07/11 0352  WBC 12.6* 15.9*  HGB 8.7* 10.4*  HCT 26.1* 30.3*  PLT 165 168   BMET  Basename 05/07/11 0352 05/06/11 0555  NA 140 143  K 4.6 3.9  CL 108 113*  CO2 27 25  GLUCOSE 136* 82  BUN 19 21  CREATININE 0.92 0.92  CALCIUM 7.8* 7.5*   PT/INR  Basename 05/06/11 0555  LABPROT 15.0  INR 1.16   ABG No results found for this basename: PHART:2,PCO2:2,PO2:2,HCO3:2 in the last 72 hours  Studies/Results: No results found.  Anti-infectives: Anti-infectives     Start     Dose/Rate Route Frequency Ordered Stop   05/06/11 0759   ceFAZolin (ANCEF) IVPB 2 g/50 mL premix        2 g 100 mL/hr over 30 Minutes Intravenous 60 min pre-op 05/06/11 0759 05/06/11 1030          Assessment/Plan: POD #2 partial gastrectomy  1. Cont pca 2. Pulm toilet, oob and ambulate today 3. Cont ng tube for another 24 hours as is then likely can come out in near future, npo 4. Check labs in am 5. scds   LOS: 4 days    [facility] 05/08/2011

## 2011-05-08 NOTE — Progress Notes (Addendum)
[facility] Inpatient Progress Note  05/08/2011  Subjective: Pt without complaints.  Pain controlled.  Pt passing flatus.   Objective:  Vital signs in last 24 hours: Filed Vitals:   05/08/11 0000 05/08/11 0400 05/08/11 0500 05/08/11 0821  BP:   130/83   Pulse:   79   Temp:   99.8 F (37.7 C)   TempSrc:      Resp: 12 10 20 15   Height:      Weight:      SpO2: 95% 98% 98% 98%   Weight change:   Intake/Output Summary (Last 24 hours) at 05/08/11 0848 Last data filed at 05/08/11 0800  Gross per 24 hour  Intake 7964.33 ml  Output    910 ml  Net 7054.33 ml    Review of Systems As above, otherwise all reviewed and reported negative   Physical Exam Gen - awake, alert, NAD, cooperative  HEENT - NCAT, MMM, NG Tube in place Neck - supple, no JVD, trachea midline  CV - normal s1, s2 sounds  Abd - midline dressing clean and dry, positive BS  EXT - SCDs bilateral LEs  Neuro - nonfocal  Lab Results: Results for orders placed during the hospital encounter of 05/04/11 (from the past 24 hour(s))  CBC     Status: Abnormal   Collection Time   05/08/11  7:11 AM      Component Value Range   WBC 12.6 (*) 4.0 - 10.5 (K/uL)   RBC 2.91 (*) 4.22 - 5.81 (MIL/uL)   Hemoglobin 8.7 (*) 13.0 - 17.0 (g/dL)   HCT 16.1 (*) 09.6 - 52.0 (%)   MCV 89.7  78.0 - 100.0 (fL)   MCH 29.9  26.0 - 34.0 (pg)   MCHC 33.3  30.0 - 36.0 (g/dL)   RDW 04.5  40.9 - 81.1 (%)   Platelets 165  150 - 400 (K/uL)     Micro Results: Recent Results (from the past 240 hour(s))  SURGICAL PCR SCREEN     Status: Normal   Collection Time   05/06/11  8:02 AM      Component Value Range Status Comment   MRSA, PCR NEGATIVE  NEGATIVE  Final    Staphylococcus aureus NEGATIVE  NEGATIVE  Final     Studies/Results: No results found.  Medications:  Scheduled Meds:   . antiseptic oral rinse  15 mL Mouth Rinse BID  . chlorhexidine  15 mL Mouth/Throat q12n4p  . HYDROmorphone PCA 0.3 mg/mL   Intravenous Q4H  .  pantoprazole (PROTONIX) IV  40 mg Intravenous Q24H  . sodium chloride  3 mL Intravenous Q12H   Continuous Infusions:   . sodium chloride 100 mL/hr at 05/07/11 2244   PRN Meds:.acetaminophen, acetaminophen, diphenhydrAMINE, diphenhydrAMINE, naloxone, ondansetron (ZOFRAN) IV, ondansetron, sodium chloride  Assessment/Plan: 1. Bleeding Leiomyoma of stomach, POD 2 s/p partial gastrectomy - post op management per surgery team, I spoke with surgeon on call, Pt has no immediate medical needs at this time, surgery team will take over care as primary team and may call [facility] at any time for assistance.   2. Leukocytosis post op- WBC has elevated since surgery, before surgery had returned to normal. 3. Anemia - following Hg.     LOS: 4 days   Shawnise Peterkin 05/08/2011, 8:48 AM   Cleora Fleet, MD, CDE, FAAFP [facility] [place], [place]  [phone]

## 2011-05-09 ENCOUNTER — Encounter (HOSPITAL_COMMUNITY): Payer: Self-pay | Admitting: Internal Medicine

## 2011-05-09 LAB — BASIC METABOLIC PANEL
BUN: 13 mg/dL (ref 6–23)
Creatinine, Ser: 0.77 mg/dL (ref 0.50–1.35)
GFR calc Af Amer: >90 mL/min (ref >90)
GFR calc non Af Amer: >90 mL/min (ref >90)
Glucose, Bld: 77 mg/dL (ref 70–99)

## 2011-05-09 LAB — CBC
MCH: 29.5 pg (ref 26.0–34.0)
MCHC: 33.3 g/dL (ref 30.0–36.0)
RDW: 14.8 % (ref 11.5–15.5)

## 2011-05-09 MED ORDER — HYDROMORPHONE 0.3 MG/ML IV SOLN
INTRAVENOUS | Status: AC
  Filled 2011-05-09: qty 25

## 2011-05-09 NOTE — Progress Notes (Signed)
  Echocardiogram 2D Echocardiogram has been performed.  Shiquan Mathieu L 05/09/2011, 12:50 PM

## 2011-05-09 NOTE — Progress Notes (Signed)
Please note, we will arrange out patient Nuc. Stress test in 2 weeks or so but will proceed with 2D Echo now while pt. Here.

## 2011-05-09 NOTE — Progress Notes (Signed)
Can remove  NGT in am.  Follow WBC.  Looks good overall.

## 2011-05-09 NOTE — Progress Notes (Signed)
3 Days Post-Op  Subjective: Pt looks good. Passing flatus. Pain control good. Denies CP, SOB. Most pain/soreness is LUQ/(L)flank  Objective: Vital signs in last 24 hours: Temp:  [99.7 F (37.6 C)] 99.7 F (37.6 C) (03/11 0500) Pulse Rate:  [104-110] 104  (03/11 0500) Resp:  [9-20] 18  (03/11 0500) BP: (126-154)/(74-82) 154/81 mmHg (03/11 0500) SpO2:  [95 %-100 %] 98 % (03/11 0500) FiO2 (%):  [37 %] 37 % (03/10 1631) Last BM Date: 05/04/11  Intake/Output this shift:    Physical Exam: BP 154/81  Pulse 104  Temp(Src) 99.7 F (37.6 C) (Oral)  Resp 18  Ht 5\' 10"  (1.778 m)  Wt 124 kg (273 lb 5.9 oz)  BMI 39.22 kg/m2  SpO2 98% Lungs: CTA without w/r/r Heart: Regular Abdomen: soft, ND, appropriately tender   Incision c/d/i without erythema or hematoma. Ext: No edema or tenderness   Labs: CBC  Basename 05/09/11 0630 05/08/11 0711  WBC 14.7* 12.6*  HGB 8.7* 8.7*  HCT 26.1* 26.1*  PLT 185 165   BMET  Basename 05/09/11 0630 05/07/11 0352  NA 142 140  K 3.1* 4.6  CL 109 108  CO2 23 27  GLUCOSE 77 136*  BUN 13 19  CREATININE 0.77 0.92  CALCIUM 8.1* 7.8*   LFT No results found for this basename: PROT,ALBUMIN,AST,ALT,ALKPHOS,BILITOT,BILIDIR,IBILI,LIPASE in the last 72 hours PT/INR No results found for this basename: LABPROT:2,INR:2 in the last 72 hours ABG No results found for this basename: PHART:2,PCO2:2,PO2:2,HCO3:2 in the last 72 hours  Studies/Results: No results found.  Assessment: Principal Problem:  *GIB (gastrointestinal bleeding) Active Problems:  Abnormal EKG  Tachycardia  Leukocytosis  Nonspecific abnormal finding in stool contents  Acute posthemorrhagic anemia  Gastric mass   Procedure(s): PARTIAL GASTRECTOMY  Plan: WBC slightly up and low grade fever. Encouraged OOB, walking in halls. Scant NG output. Will d/w MD whether UGI is needed prior to removing NG. Follow WBC and temp trends, currently not on any antibiotics.  LOS: 5 days     Alyse Low 05/09/2011 8:36 AM  Pt

## 2011-05-09 NOTE — Progress Notes (Signed)
Please feel free to call with questions or concerns. We are arranging OP evalation & follow-up.  Marykay Lex, M.D., M.S. [facility] [address] [place], [place]  [address]  [phone]  05/09/2011 12:36 PM

## 2011-05-10 LAB — CBC
MCHC: 34.8 g/dL (ref 30.0–36.0)
MCV: 86.3 fL (ref 78.0–100.0)
Platelets: 222 10*3/uL (ref 150–400)
RDW: 14.6 % (ref 11.5–15.5)
WBC: 14.5 10*3/uL — ABNORMAL HIGH (ref 4.0–10.5)

## 2011-05-10 NOTE — Progress Notes (Signed)
UR complete 

## 2011-05-10 NOTE — Consult Note (Signed)
Reveiwed By Patsy Baltimore EdD

## 2011-05-10 NOTE — Progress Notes (Signed)
4 Days Post-Op  Subjective: Pt ok. Pain control good. NG inadvertently came out this am. He's passing flatus and NG output was minimal yesterday. He's not been OOB much  Objective: Vital signs in last 24 hours: Temp:  [99.1 F (37.3 C)-100.1 F (37.8 C)] 100.1 F (37.8 C) (03/12 0745) Pulse Rate:  [90-104] 90  (03/12 0745) Resp:  [16-20] 16  (03/12 0745) BP: (156-165)/(75-92) 156/92 mmHg (03/12 0745) SpO2:  [96 %-98 %] 98 % (03/12 0745) Last BM Date: 05/04/11  Intake/Output this shift:    Physical Exam: BP 156/92  Pulse 90  Temp(Src) 100.1 F (37.8 C) (Oral)  Resp 16  Ht 5\' 10"  (1.778 m)  Wt 124 kg (273 lb 5.9 oz)  BMI 39.22 kg/m2  SpO2 98% Lungs: CTA without w/r/r Heart: Regular Abdomen: soft, ND, appropriately tender   Incision c/d/i without erythema or hematoma. Ext: No edema or tenderness   Labs: CBC  Basename 05/10/11 0500 05/09/11 0630  WBC 14.5* 14.7*  HGB 8.8* 8.7*  HCT 25.3* 26.1*  PLT 222 185   BMET  Basename 05/09/11 0630  NA 142  K 3.1*  CL 109  CO2 23  GLUCOSE 77  BUN 13  CREATININE 0.77  CALCIUM 8.1*   LFT No results found for this basename: PROT,ALBUMIN,AST,ALT,ALKPHOS,BILITOT,BILIDIR,IBILI,LIPASE in the last 72 hours PT/INR No results found for this basename: LABPROT:2,INR:2 in the last 72 hours ABG No results found for this basename: PHART:2,PCO2:2,PO2:2,HCO3:2 in the last 72 hours  Studies/Results: No results found.  Assessment: Principal Problem:  *GIB (gastrointestinal bleeding) Active Problems:  Abnormal EKG  Tachycardia  Leukocytosis  Nonspecific abnormal finding in stool contents  Acute posthemorrhagic anemia  Gastric mass   Procedure(s): PARTIAL GASTRECTOMY  Plan: Keep NG out. Will start sips of clears. Encourage OOB/IS Recheck lytes in am.  LOS: 6 days    Alyse Low 05/10/2011 8:21 AM

## 2011-05-10 NOTE — Progress Notes (Signed)
Start diet

## 2011-05-11 LAB — CBC
Hemoglobin: 9.5 g/dL — ABNORMAL LOW (ref 13.0–17.0)
MCH: 29.8 pg (ref 26.0–34.0)
MCHC: 34.3 g/dL (ref 30.0–36.0)
Platelets: 264 10*3/uL (ref 150–400)
RBC: 3.19 MIL/uL — ABNORMAL LOW (ref 4.22–5.81)

## 2011-05-11 LAB — BASIC METABOLIC PANEL
Calcium: 8.2 mg/dL — ABNORMAL LOW (ref 8.4–10.5)
GFR calc non Af Amer: >90 mL/min (ref >90)
Glucose, Bld: 95 mg/dL (ref 70–99)
Potassium: 3.3 mEq/L — ABNORMAL LOW (ref 3.5–5.1)
Sodium: 141 mEq/L (ref 135–145)

## 2011-05-11 MED ORDER — BOOST / RESOURCE BREEZE PO LIQD
1.0000 | Freq: Three times a day (TID) | ORAL | Status: DC
  Administered 2011-05-11 – 2011-05-15 (×9): 1 via ORAL

## 2011-05-11 MED ORDER — PRO-STAT SUGAR FREE PO LIQD
30.0000 mL | Freq: Three times a day (TID) | ORAL | Status: DC
  Administered 2011-05-11 – 2011-05-15 (×9): 30 mL via ORAL
  Filled 2011-05-11 (×13): qty 30

## 2011-05-11 MED ORDER — POTASSIUM CHLORIDE IN NACL 20-0.45 MEQ/L-% IV SOLN
INTRAVENOUS | Status: DC
  Administered 2011-05-11 – 2011-05-14 (×4): via INTRAVENOUS
  Filled 2011-05-11 (×9): qty 1000

## 2011-05-11 NOTE — Progress Notes (Signed)
D/C PCA.  Go slow on diet

## 2011-05-11 NOTE — Consult Note (Signed)
Reveiwed by S.M. John Williamsen EdD 

## 2011-05-11 NOTE — Progress Notes (Signed)
5 Days Post-Op  Subjective: Pt ok, feels good. NOt much PCA use. Tol sips of clears well. Passing flatus No other c/o  Objective: Vital signs in last 24 hours: Temp:  [98.8 F (37.1 C)-99.4 F (37.4 C)] 99.4 F (37.4 C) (03/13 0500) Pulse Rate:  [95-99] 99  (03/13 0500) Resp:  [13-20] 20  (03/13 0500) BP: (156-163)/(96-97) 163/96 mmHg (03/13 0500) SpO2:  [97 %-100 %] 99 % (03/13 0500) FiO2 (%):  [99 %-100 %] 100 % (03/12 1300) Last BM Date: 05/05/11  Intake/Output this shift:    Physical Exam: BP 163/96  Pulse 99  Temp(Src) 99.4 F (37.4 C) (Oral)  Resp 20  Ht 5\' 10"  (1.778 m)  Wt 124 kg (273 lb 5.9 oz)  BMI 39.22 kg/m2  SpO2 99% Lungs: CTA without w/r/r Heart: Regular Abdomen: soft, ND, appropriately tender, active BS   Incision c/d/i without erythema or hematoma. Ext: No edema or tenderness   Labs: CBC  Basename 05/11/11 0535 05/10/11 0500  WBC 10.3 14.5*  HGB 9.5* 8.8*  HCT 27.7* 25.3*  PLT 264 222   BMET  Basename 05/11/11 0535 05/09/11 0630  NA 141 142  K 3.3* 3.1*  CL 109 109  CO2 23 23  GLUCOSE 95 77  BUN 15 13  CREATININE 0.65 0.77  CALCIUM 8.2* 8.1*   LFT No results found for this basename: PROT,ALBUMIN,AST,ALT,ALKPHOS,BILITOT,BILIDIR,IBILI,LIPASE in the last 72 hours PT/INR No results found for this basename: LABPROT:2,INR:2 in the last 72 hours ABG No results found for this basename: PHART:2,PCO2:2,PO2:2,HCO3:2 in the last 72 hours  Studies/Results: No results found.  Assessment: Principal Problem:  *GIB (gastrointestinal bleeding) Active Problems:  Abnormal EKG  Tachycardia  Leukocytosis  Nonspecific abnormal finding in stool contents  Acute posthemorrhagic anemia  Gastric mass   Procedure(s): PARTIAL GASTRECTOMY  Plan: Advance to clears. OOB/IS Follow K, change MIVF to 1/2NS +53meqKCl, same rate Hopefully DC PCA later today if tol clears well and can switch to po pain control  LOS: 7 days    Alyse Low 05/11/2011 8:44 AM

## 2011-05-11 NOTE — Progress Notes (Signed)
INITIAL ADULT NUTRITION ASSESSMENT Date: 05/11/2011   Time: 4:01 PM  Reason for Assessment: NPO/Clear Liquids x 7 days  ASSESSMENT: Male 56 y.o.  Dx: GIB (gastrointestinal bleeding)  Hx:  Past Medical History  Diagnosis Date  . Anemia 2009    noted during hospitalization for surgical I&D of chest, arm abscess  . Depression     Related Meds:     . antiseptic oral rinse  15 mL Mouth Rinse BID  . chlorhexidine  15 mL Mouth/Throat q12n4p  . heparin subcutaneous  5,000 Units Subcutaneous Q8H  . HYDROmorphone PCA 0.3 mg/mL   Intravenous Q4H  . pantoprazole (PROTONIX) IV  40 mg Intravenous Q24H  . sodium chloride  3 mL Intravenous Q12H    Ht: 5\' 10"  (177.8 cm)  Wt: 273 lb 5.9 oz (124 kg)  Ideal Wt: 75.4 kg % Ideal Wt: 164%  Usual Wt: 258 lb % Usual Wt: 105%  Body mass index is 39.22 kg/(m^2).  Food/Nutrition Related Hx: no triggers per admission nutrition screen  Labs:  CMP     Component Value Date/Time   NA 141 05/11/2011 0535   K 3.3* 05/11/2011 0535   CL 109 05/11/2011 0535   CO2 23 05/11/2011 0535   GLUCOSE 95 05/11/2011 0535   BUN 15 05/11/2011 0535   CREATININE 0.65 05/11/2011 0535   CALCIUM 8.2* 05/11/2011 0535   PROT 5.2* 05/06/2011 0555   ALBUMIN 2.5* 05/06/2011 0555   AST 13 05/06/2011 0555   ALT 8 05/06/2011 0555   ALKPHOS 53 05/06/2011 0555   BILITOT 0.4 05/06/2011 0555   GFRNONAA >90 05/11/2011 0535   GFRAA >90 05/11/2011 0535    I/O last 3 completed shifts: In: 240 [P.O.:240] Out: 905 [Urine:905] Total I/O In: 800 [I.V.:800] Out: 210 [Urine:210]  CBG (last 3)   Basename 05/08/11 2057  GLUCAP 77    Diet Order: Clear Liquid  Supplements/Tube Feeding: N/A  IVF:    0.45 % NaCl with KCl 20 mEq / L Last Rate: 100 mL/hr at 05/11/11 1108  DISCONTD: sodium chloride Last Rate: 100 mL/hr at 05/10/11 1616    Estimated Nutritional Needs:   Kcal: 1610-9604 Protein: 115-125 gm Fluid: 2.2-2.4 L  RD spoke with pt re: nutrition hx -- s/p partial gastrectomy  3/8; pt reports a good appetite PTA with no recent weight loss; has been on and off Clear Liquids vs NPO status x 7 days; tolerating Clear Liquids, passing flatus per chart review; noted ulceration to right nare from NGT & tape; he would benefit from addition of nutrition supplements to help meet nutrition needs -- pt amenable.  NUTRITION DIAGNOSIS: -Inadequate protein energy intake (NI-5.3).  Status: Ongoing  RELATED TO: clear liquid diet  AS EVIDENCE BY: provision of ~ 1,000 kcals, 0 gm protein  MONITORING/EVALUATION(Goals): Goal: meet >90% of estimated nutrition needs to preserve lean body mass Monitor: PO intake, labs, weight, I/O's  EDUCATION NEEDS: -No education needs identified at this time  INTERVENTION:  Add Nurse, adult supplement PO TID (250 kcals, 9 gm protein per 8 fl oz carton)  Prostat liquid protein 30 ml PO TID (72 kcals, 15 gm protein per dose)  RD to follow for nutrition care plan  Dietitian #: [phone]  DOCUMENTATION CODES Per approved criteria  -Obesity Unspecified    John Keller 05/11/2011, 4:01 PM

## 2011-05-12 LAB — BASIC METABOLIC PANEL
BUN: 9 mg/dL (ref 6–23)
Chloride: 107 mEq/L (ref 96–112)
GFR calc Af Amer: >90 mL/min (ref >90)
GFR calc non Af Amer: >90 mL/min (ref >90)
Potassium: 3.2 mEq/L — ABNORMAL LOW (ref 3.5–5.1)
Sodium: 139 mEq/L (ref 135–145)

## 2011-05-12 MED ORDER — HYDROCODONE-ACETAMINOPHEN 5-325 MG PO TABS: 1.0000 | ORAL_TABLET | ORAL | Status: DC | PRN

## 2011-05-12 MED ORDER — POTASSIUM CHLORIDE CRYS ER 20 MEQ PO TBCR
20.0000 meq | EXTENDED_RELEASE_TABLET | Freq: Two times a day (BID) | ORAL | Status: AC
  Administered 2011-05-12 – 2011-05-13 (×4): 20 meq via ORAL
  Filled 2011-05-12 (×4): qty 1

## 2011-05-12 NOTE — Progress Notes (Signed)
6 Days Post-Op  Subjective: Pt ok. Feeling better this am.  +BM yesterday. No N/V with clears.  Objective: Vital signs in last 24 hours: Temp:  [97.9 F (36.6 C)-99 F (37.2 C)] 97.9 F (36.6 C) (03/14 0500) Pulse Rate:  [84-93] 84  (03/14 0500) Resp:  [16-20] 16  (03/14 0500) BP: (149-158)/(78-104) 152/86 mmHg (03/14 0500) SpO2:  [98 %-100 %] 100 % (03/14 0500) FiO2 (%):  [98 %] 98 % (03/14 0000) Last BM Date: 05/11/11  Intake/Output this shift:    Physical Exam: BP 152/86  Pulse 84  Temp(Src) 97.9 F (36.6 C) (Oral)  Resp 16  Ht 5\' 10"  (1.778 m)  Wt 124 kg (273 lb 5.9 oz)  BMI 39.22 kg/m2  SpO2 100% Lungs: CTA without w/r/r Heart: Regular Abdomen: soft, ND, appropriately tender   Incision c/d/i without erythema or hematoma. Ext: No edema or tenderness   Labs: CBC  Basename 05/11/11 0535 05/10/11 0500  WBC 10.3 14.5*  HGB 9.5* 8.8*  HCT 27.7* 25.3*  PLT 264 222   BMET  Basename 05/12/11 0640 05/11/11 0535  NA 139 141  K 3.2* 3.3*  CL 107 109  CO2 25 23  GLUCOSE 99 95  BUN 9 15  CREATININE 0.76 0.65  CALCIUM 8.2* 8.2*   LFT No results found for this basename: PROT,ALBUMIN,AST,ALT,ALKPHOS,BILITOT,BILIDIR,IBILI,LIPASE in the last 72 hours PT/INR No results found for this basename: LABPROT:2,INR:2 in the last 72 hours ABG No results found for this basename: PHART:2,PCO2:2,PO2:2,HCO3:2 in the last 72 hours  Studies/Results: No results found.  Assessment: Principal Problem:  *GIB (gastrointestinal bleeding) Active Problems:  Abnormal EKG  Tachycardia  Leukocytosis  Nonspecific abnormal finding in stool contents  Acute posthemorrhagic anemia  Gastric mass   Procedure(s): PARTIAL GASTRECTOMY  Plan: DC PCA PO pain control OOB/IS Replace K again Will d/w MD before advancing diet  LOS: 8 days    Alyse Low 05/12/2011 8:09 AM

## 2011-05-12 NOTE — Progress Notes (Signed)
D/C PCA and advance diet.

## 2011-05-13 LAB — BASIC METABOLIC PANEL
CO2: 26 mEq/L (ref 19–32)
Chloride: 107 mEq/L (ref 96–112)
Creatinine, Ser: 0.77 mg/dL (ref 0.50–1.35)
GFR calc Af Amer: >90 mL/min (ref >90)
Potassium: 3.6 mEq/L (ref 3.5–5.1)
Sodium: 140 mEq/L (ref 135–145)

## 2011-05-13 MED ORDER — PANTOPRAZOLE SODIUM 40 MG PO TBEC
40.0000 mg | DELAYED_RELEASE_TABLET | Freq: Every day | ORAL | Status: DC
  Administered 2011-05-13 – 2011-05-14 (×2): 40 mg via ORAL
  Filled 2011-05-13 (×2): qty 1

## 2011-05-13 NOTE — Progress Notes (Signed)
Looks well advance diet

## 2011-05-13 NOTE — Progress Notes (Signed)
7 Days Post-Op  Subjective: Doing well, very pleasant.  Tolerating full liquids without nausea, would like to advance to regular diet and see how it goes.  Occasional bloating, relieved by flatus or BM.  Objective: Vital signs in last 24 hours: Temp:  [98.5 F (36.9 C)-99.1 F (37.3 C)] 98.5 F (36.9 C) (03/15 0500) Pulse Rate:  [84-96] 84  (03/15 0500) Resp:  [18-20] 20  (03/15 0500) BP: (142-159)/(72-91) 142/78 mmHg (03/15 0500) SpO2:  [97 %-100 %] 98 % (03/15 0500) Last BM Date: 05/13/11  Intake/Output from previous day:   Intake/Output this shift:    PE: Abd: soft, nontender, +BS, nondistended, incision site c/d/i with staples present  Lab Results:   [facility] 05/11/11 0535  WBC 10.3  HGB 9.5*  HCT 27.7*  PLT 264   BMET  Basename 05/13/11 0500 05/12/11 0640  NA 140 139  K 3.6 3.2*  CL 107 107  CO2 26 25  GLUCOSE 96 99  BUN 8 9  CREATININE 0.77 0.76  CALCIUM 8.1* 8.2*   PT/INR No results found for this basename: LABPROT:2,INR:2 in the last 72 hours   Studies/Results: No results found.  Anti-infectives: Anti-infectives     Start     Dose/Rate Route Frequency Ordered Stop   05/06/11 0759   ceFAZolin (ANCEF) IVPB 2 g/50 mL premix        2 g 100 mL/hr over 30 Minutes Intravenous 60 min pre-op 05/06/11 0759 05/06/11 1030           Assessment/Plan 1. Upper GIB (leiomyoma) 2. S/p partial gastrectomy 3. Hypokalemia, now resolved  Plan: 1. Patient tolerating full liquid diet, will advance to regular diet and monitor today.  May be able to d/c home tomorrow.  2. Pain well controlled with po pain regimen. 3. Continue to encourage OOB with ambulation and incentive spirometry. 4. Consider d/c staples prior to d/c (pt now POD 7) vs in office at follow up next week.    LOS: 9 days    John Keller 05/13/2011, 10:05 AM

## 2011-05-13 NOTE — Discharge Instructions (Signed)
[facility]      [facility] [phone]  OPEN ABDOMINAL SURGERY: POST OP INSTRUCTIONS  Always review your discharge instruction sheet given to you by the facility where your surgery was performed.  IF YOU HAVE DISABILITY OR FAMILY LEAVE FORMS, YOU MUST BRING THEM TO THE OFFICE FOR PROCESSING.  PLEASE DO NOT GIVE THEM TO YOUR DOCTOR.  1. A prescription for pain medication may be given to you upon discharge.  Take your pain medication as prescribed, if needed.  If narcotic pain medicine is not needed, then you may take acetaminophen (Tylenol) or ibuprofen (Advil) as needed. 2. Take your usually prescribed medications unless otherwise directed. 3. If you need a refill on your pain medication, please contact your pharmacy. They will contact our office to request authorization.  Prescriptions will not be filled after 5pm or on week-ends. 4. You should follow a light diet the first few days after arrival home, such as soup and crackers, pudding, etc.unless your doctor has advised otherwise. A high-fiber, low fat diet can be resumed as tolerated.   Be sure to include lots of fluids daily. Most patients will experience some swelling and bruising on the chest and neck area.  Ice packs will help.  Swelling and bruising can take several days to resolve 5. Most patients will experience some swelling and bruising in the area of the incision. Ice pack will help. Swelling and bruising can take several days to resolve..  6. It is common to experience some constipation if taking pain medication after surgery.  Increasing fluid intake and taking a stool softener will usually help or prevent this problem from occurring.  A mild laxative (Milk of Magnesia or Miralax) should be taken according to package directions if there are no bowel movements after 48 hours. 7.  You may have steri-strips (small skin tapes) in place directly over the incision.  These strips should be left on the skin for 7-10 days.  If your  surgeon used skin glue on the incision, you may shower in 24 hours.  The glue will flake off over the next 2-3 weeks.  Any sutures or staples will be removed at the office during your follow-up visit. You may find that a light gauze bandage over your incision may keep your staples from being rubbed or pulled. You may shower and replace the bandage daily. 8. ACTIVITIES:  You may resume regular (light) daily activities beginning the next day--such as daily self-care, walking, climbing stairs--gradually increasing activities as tolerated.  You may have sexual intercourse when it is comfortable.  Refrain from any heavy lifting or straining until approved by your doctor. a. You may drive when you no longer are taking prescription pain medication, you can comfortably wear a seatbelt, and you can safely maneuver your car and apply brakes b. Return to Work: ___________________________________ 9. You should see your doctor in the office for a follow-up appointment approximately two weeks after your surgery.  Make sure that you call for this appointment within a day or two after you arrive home to insure a convenient appointment time. OTHER INSTRUCTIONS:  _____________________________________________________________ _____________________________________________________________  WHEN TO CALL YOUR DOCTOR: 1. Fever over 101.0 2. Inability to urinate 3. Nausea and/or vomiting 4. Extreme swelling or bruising 5. Continued bleeding from incision. 6. Increased pain, redness, or drainage from the incision. 7. Difficulty swallowing or breathing 8. Muscle cramping or spasms. 9. Numbness or tingling in hands or feet or around lips.  The clinic staff is available to   answer your questions during regular business hours.  Please don't hesitate to call and ask to speak to one of the nurses if you have concerns.  For further questions, please visit [email or web address]   

## 2011-05-14 NOTE — Progress Notes (Signed)
Patient ID: John Keller, male   DOB: 04-Apr-1955, 56 y.o.   MRN: [id number]   Doing well.  Tolerating po. +BM's Incision healing well Abdomen soft  Plan: Decrease IVF Oral pain meds D/c in next 24-48 hrs

## 2011-05-15 MED ORDER — HYDROCODONE-ACETAMINOPHEN 5-325 MG PO TABS: 1.0000 | ORAL_TABLET | ORAL | Status: AC | PRN

## 2011-05-15 NOTE — Discharge Summary (Signed)
Physician Discharge Summary  Patient ID: John Keller MRN: [id number] DOB/AGE: 55-Oct-1957 55 y.o.  Admit date: 05/04/2011 Discharge date: 05/15/2011  Admission Diagnoses:  Discharge Diagnoses:  Principal Problem:  *GIB (gastrointestinal bleeding) Active Problems:  Abnormal EKG  Tachycardia  Leukocytosis  Nonspecific abnormal finding in stool contents  Acute posthemorrhagic anemia  Gastric mass   Discharged Condition: good  Hospital Course: Patient was admitted with melena.  Work-up included upper endoscopy which revealed a gastric mass.  We saw the patient in consultation and he underwent partial gastrectomy.  He tolerated this well.  He had the expected post-op ileus bu gradually improved and tolerated advancement of his diet.  He had good pain control and was D/C home in stable condition.  Consults: general surgery  Significant Diagnostic Studies: Upper endo  Treatments: surgery: partial gastrectomy  Discharge Exam: Blood pressure 139/67, pulse 79, temperature 98.6 F (37 C), temperature source Oral, resp. rate 16, height 5\' 10"  (1.778 m), weight 124 kg (273 lb 5.9 oz), SpO2 98.00%. General appearance: alert Resp: clear to auscultation bilaterally Cardio: regular rate and rhythm GI: incision CDI  Disposition: 01-Home or Self Care  Discharge Orders    Future Orders Please Complete By Expires   Diet - low sodium heart healthy      Increase activity slowly      Discharge instructions      Comments:   No lifting over 10lbs. May shower.     Medication List  As of 05/15/2011  2:48 PM   STOP taking these medications         COTABFLU PO      EXCEDRIN EXTRA STRENGTH 250-250-65 MG per tablet         TAKE these medications         dimenhyDRINATE 50 MG tablet   Commonly known as: DRAMAMINE   Take 50 mg by mouth every 8 (eight) hours as needed. For dizzieness      HYDROcodone-acetaminophen 5-325 MG per tablet   Commonly known as: NORCO   Take 1-2 tablets by mouth  every 4 (four) hours as needed for pain.      hydroxypropyl methylcellulose 2.5 % ophthalmic solution   Commonly known as: ISOPTO TEARS   Place 1 drop into both eyes 4 (four) times daily as needed. For dry eyes      mulitivitamin with minerals Tabs   Take 1 tablet by mouth daily.      naproxen 250 MG tablet   Commonly known as: NAPROSYN   Take 250-500 mg by mouth 2 (two) times daily with a meal. For pain           Follow-up Information    Follow up with WYATT Rene Kocher, MD in 1 week.   Contact information:   [facility] [address] [place] [address] [phone]          Signed: Liz Malady 05/15/2011, 2:48 PM

## 2011-05-15 NOTE — Progress Notes (Signed)
9 Days Post-Op  Subjective: Doing well  Objective: Vital signs in last 24 hours: Temp:  [98.6 F (37 C)-99.5 F (37.5 C)] 98.6 F (37 C) (03/17 0530) Pulse Rate:  [79-94] 79  (03/17 0530) Resp:  [14-20] 16  (03/17 0530) BP: (139-160)/(67-94) 139/67 mmHg (03/17 0530) SpO2:  [98 %] 98 % (03/17 0530) Last BM Date: 05/14/11  Intake/Output from previous day: 03/16 0701 - 03/17 0700 In: 360 [P.O.:360] Out: -  Intake/Output this shift: Total I/O In: 240 [P.O.:240] Out: -   General appearance: alert and cooperative Resp: clear to auscultation bilaterally GI: soft, incision CDI, +BS  Lab Results:  No results found for this basename: WBC:2,HGB:2,HCT:2,PLT:2 in the last 72 hours BMET  Basename 05/13/11 0500  NA 140  K 3.6  CL 107  CO2 26  GLUCOSE 96  BUN 8  CREATININE 0.77  CALCIUM 8.1*   PT/INR No results found for this basename: LABPROT:2,INR:2 in the last 72 hours ABG No results found for this basename: PHART:2,PCO2:2,PO2:2,HCO3:2 in the last 72 hours  Studies/Results: No results found.  Anti-infectives: Anti-infectives     Start     Dose/Rate Route Frequency Ordered Stop   05/06/11 0759   ceFAZolin (ANCEF) IVPB 2 g/50 mL premix        2 g 100 mL/hr over 30 Minutes Intravenous 60 min pre-op 05/06/11 0759 05/06/11 1030          Assessment/Plan: s/p Procedure(s) (LRB): PARTIAL GASTRECTOMY (N/A) Discharge  LOS: 11 days    John Keller E 05/15/2011

## 2011-05-26 ENCOUNTER — Encounter (INDEPENDENT_AMBULATORY_CARE_PROVIDER_SITE_OTHER): Payer: Self-pay | Admitting: General Surgery

## 2011-05-26 ENCOUNTER — Ambulatory Visit (INDEPENDENT_AMBULATORY_CARE_PROVIDER_SITE_OTHER): Payer: Self-pay | Admitting: General Surgery

## 2011-05-26 VITALS — BP 152/90 | HR 96 | Ht 70.0 in | Wt 259.4 lb

## 2011-05-26 DIAGNOSIS — Z9889 Other specified postprocedural states: Secondary | ICD-10-CM

## 2011-05-26 DIAGNOSIS — D214 Benign neoplasm of connective and other soft tissue of abdomen: Secondary | ICD-10-CM | POA: Insufficient documentation

## 2011-05-26 DIAGNOSIS — Z903 Acquired absence of stomach [part of]: Secondary | ICD-10-CM

## 2011-05-26 NOTE — Progress Notes (Signed)
HPI The patient is status post partial gastrectomy for any GIST tumor. He is doing well with the exception that he feels like he gets indigestion when he is a normal amount of food. He also feels very weak and tired.  PE An examination his wound is healed well. He as no tenderness. There is no hernia.  Studiy review   Currently no studies to review however I will order an upper GI study  Assessment Status post partial gastrectomy for bleeding and Gist tumor  Plan Upper GI study with difficulty eating. Reassess the patient in about one week in the next available clinic appointment.

## 2011-06-02 ENCOUNTER — Ambulatory Visit
Admission: RE | Admit: 2011-06-02 | Discharge: 2011-06-02 | Disposition: A | Discharge status: Home / Self Care | Payer: No Typology Code available for payment source | Source: Ambulatory Visit | Attending: General Surgery | Admitting: General Surgery

## 2011-06-02 DIAGNOSIS — Z903 Acquired absence of stomach [part of]: Secondary | ICD-10-CM

## 2011-06-07 ENCOUNTER — Encounter (INDEPENDENT_AMBULATORY_CARE_PROVIDER_SITE_OTHER): Payer: Self-pay | Admitting: General Surgery

## 2011-06-07 ENCOUNTER — Ambulatory Visit (INDEPENDENT_AMBULATORY_CARE_PROVIDER_SITE_OTHER): Payer: Self-pay | Admitting: General Surgery

## 2011-06-07 VITALS — BP 178/102 | HR 88 | Temp 98.3°F | Resp 20 | Ht 70.0 in | Wt 259.4 lb

## 2011-06-07 DIAGNOSIS — Z09 Encounter for follow-up examination after completed treatment for conditions other than malignant neoplasm: Secondary | ICD-10-CM

## 2011-06-07 NOTE — Progress Notes (Signed)
HPI The patient continues to have difficulty eating normally with pain starting with the use of normal amount.  PE On examination his wound has healed well and he has no hernia.  Studiy review I reviewed the patient's upper GI series with small bowel follow-through he and he has no obstruction in the upper stomach or of the outlet.  Assessment Doing well status post partial gastrectomy for a Gist tumor.  Plan Return to see me on a p.r.n. basis. I have released him to go back to work on June 13, 2011.

## 2011-06-09 ENCOUNTER — Other Ambulatory Visit (HOSPITAL_COMMUNITY): Payer: Self-pay | Admitting: Cardiology

## 2011-06-09 DIAGNOSIS — R011 Cardiac murmur, unspecified: Secondary | ICD-10-CM

## 2011-06-14 ENCOUNTER — Encounter (HOSPITAL_COMMUNITY)
Admission: RE | Admit: 2011-06-14 | Discharge: 2011-06-14 | Disposition: A | Discharge status: Home / Self Care | Payer: Self-pay | Source: Ambulatory Visit | Attending: Cardiology | Admitting: Cardiology

## 2011-06-14 DIAGNOSIS — R011 Cardiac murmur, unspecified: Secondary | ICD-10-CM | POA: Insufficient documentation

## 2011-06-14 DIAGNOSIS — R079 Chest pain, unspecified: Secondary | ICD-10-CM | POA: Insufficient documentation

## 2011-06-14 MED ORDER — TECHNETIUM TC 99M TETROFOSMIN IV KIT
30.0000 | PACK | Freq: Once | INTRAVENOUS | Status: AC | PRN
  Administered 2011-06-14: 30 via INTRAVENOUS

## 2011-06-14 MED ORDER — REGADENOSON 0.4 MG/5ML IV SOLN
INTRAVENOUS | Status: AC
  Administered 2011-06-14: 0.4 mg
  Filled 2011-06-14: qty 5

## 2011-06-14 MED ORDER — TECHNETIUM TC 99M TETROFOSMIN IV KIT
10.0000 | PACK | Freq: Once | INTRAVENOUS | Status: AC | PRN
  Administered 2011-06-14: 10 via INTRAVENOUS

## 2011-06-14 MED ORDER — REGADENOSON 0.4 MG/5ML IV SOLN: 0.4000 mg | Freq: Once | INTRAVENOUS | Status: DC

## 2011-06-14 NOTE — Progress Notes (Signed)
Pt in for cardiac stress test using Lexiscan.  Sherian Rein, NA at bedside for start and throughout exam.  Pt tolerated procedure without complicationa

## 2012-08-06 ENCOUNTER — Encounter: Payer: Self-pay | Admitting: Medical

## 2012-08-06 ENCOUNTER — Ambulatory Visit
Admission: RE | Admit: 2012-08-06 | Discharge: 2012-08-06 | Disposition: A | Discharge status: Home / Self Care | Payer: BC Managed Care – PPO | Source: Ambulatory Visit | Attending: Medical | Admitting: Medical

## 2012-08-06 ENCOUNTER — Ambulatory Visit (INDEPENDENT_AMBULATORY_CARE_PROVIDER_SITE_OTHER): Payer: BC Managed Care – PPO | Admitting: Medical

## 2012-08-06 VITALS — BP 140/90 | HR 76 | Temp 98.2°F | Resp 16 | Ht 69.0 in | Wt 247.0 lb

## 2012-08-06 DIAGNOSIS — R8290 Unspecified abnormal findings in urine: Secondary | ICD-10-CM

## 2012-08-06 DIAGNOSIS — R109 Unspecified abdominal pain: Secondary | ICD-10-CM

## 2012-08-06 DIAGNOSIS — R82998 Other abnormal findings in urine: Secondary | ICD-10-CM

## 2012-08-06 DIAGNOSIS — I10 Essential (primary) hypertension: Secondary | ICD-10-CM

## 2012-08-06 DIAGNOSIS — Z8719 Personal history of other diseases of the digestive system: Secondary | ICD-10-CM

## 2012-08-06 DIAGNOSIS — D490 Neoplasm of unspecified behavior of digestive system: Secondary | ICD-10-CM

## 2012-08-06 LAB — POCT URINALYSIS DIPSTICK
Spec Grav, UA: 1.025
Urobilinogen, UA: NEGATIVE

## 2012-08-06 MED ORDER — CIPROFLOXACIN HCL 500 MG PO TABS: 500.0000 mg | ORAL_TABLET | Freq: Two times a day (BID) | ORAL | Status: DC

## 2012-08-06 NOTE — Progress Notes (Signed)
Subjective: Here as a new patient to establish and abdominal pain.  He reports hx/o GI bleed 2013, ended up having stomach surgery to repair the bleed.  Was released by general surgery a year ago.  Since then he notes ongoing abdomina pain and urinary odor.  He notes pain in stomach ongoing, worse of recent, says its in the stomach region.  Takes Tagamet OTC but not helping.  Pain is worse after eating meals, worse after heavy meals, seems worse with stress.  Usually gets pain 30-45 min after eating . Denies nausea, vomiting, diarrhea, no blood in stool, no hematemesis.  Does get some constipation, occurs 1-2 x /wk.  Last BM this morning, normal.  He denies prior seeing GI doctor.   Notes colonoscopy roughly 10 years ago.   Last EGD and upper GI a year ago.   He does have some fatigue, no weight loss, fever, night sweats.   He notes urinary odor ongoing since his surgery a year ago.  No hx/o UTI or prostatitis.  No other aggravating or relieving factors.  No other c/o.   ROS as in subjective   No Known Allergies  Current Outpatient Prescriptions on File Prior to Visit  Medication Sig Dispense Refill  . hydroxypropyl methylcellulose (ISOPTO TEARS) 2.5 % ophthalmic solution Place 1 drop into both eyes 4 (four) times daily as needed. For dry eyes      . Multiple Vitamin (MULITIVITAMIN WITH MINERALS) TABS Take 1 tablet by mouth daily.       No current facility-administered medications on file prior to visit.    Past Medical History  Diagnosis Date  . Anemia 2009    noted during hospitalization for surgical I&D of chest, arm abscess  . Depression   . 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 GI bleed 2013  . Hypertension     Past Surgical History  Procedure Laterality Date  . Incise and drain abcess  2009    patient had abscesses and cellulitis of the chest and arm which grew out microfollicular strap.  . Esophagogastroduodenoscopy  05/05/2011    Procedure: ESOPHAGOGASTRODUODENOSCOPY (EGD);   Surgeon: Hilarie Fredrickson, MD;  Location: [facility] ENDOSCOPY;  Service: Endoscopy;  Laterality: N/A;  . Gastrectomy      Family History  Problem Relation Age of Onset  . Anesthesia problems Neg Hx   . Hypotension Neg Hx   . Malignant hyperthermia Neg Hx   . Pseudochol deficiency Neg Hx   . Diabetes Mother   . Stroke Mother   . Aneurysm Father     died of brain aneurysm    History   Social History  . Marital Status: Single    Spouse Name: N/A    Number of Children: N/A  . Years of Education: N/A   Occupational History  . Not on file.   Social History Main Topics  . Smoking status: Never Smoker   . Smokeless tobacco: Not on file  . Alcohol Use: No  . Drug Use: No  . Sexually Active: No   Other Topics Concern  . Not on file   Social History Narrative  . No narrative on file    Reviewed their medical, surgical, family, social, medication, and allergy history and updated chart as appropriate.   Objective: Filed Vitals:   08/06/12 1130  BP: 140/90  Pulse: 76  Temp: 98.2 F (36.8 C)  Resp: 16    General appearance: alert, no distress, WD/WN Oral cavity: MMM, no lesions  Neck: supple, no lymphadenopathy, no thyromegaly, no masses Heart: RRR, normal S1, S2, no murmurs Lungs: CTA bilaterally, no wheezes, rhonchi, or rales Abdomen: +bs, soft, tender in general, worse in lower abdomen throughout, non-distended, no masses, no hepatomegaly, no splenomegaly Pulses: 2+ symmetric, upper and lower extremities, normal cap refill Ext: no edema GU: normal male genitalia, no mass, no rash, no hernia DRE: normal tone, prostate WNL, no nodules, nontender  Assessment: Encounter Diagnoses  Name Primary?  . Abdominal pain, unspecified site Yes  . Abnormal urine odor   . Gastric tumor   . History of GI bleed   . Essential hypertension, benign    Plan: Reviewed some chart records.   He has urine findings suggestive of UTI, but can't rule out other causes given his history and exam.    KUB today, labs, will send urine for culture.   Begin Cipro.  Will reviewed hospital records.   Will likely need GI consult in general for repeat colonoscopy and recheck given hx/o GIST tumor per chart record.  Follow-up pending studies

## 2012-08-06 NOTE — Patient Instructions (Signed)
Your symptoms and exam could suggest multiple things going on.   Begin Dexilant 1 capsule every morning about 45 minutes prior to breakfast for acid reflux/upper belly pain.   Lets see if this makes any difference in yours pains.  Stop Tagamet for now.  Avoid spicy, greasy, and acidic foods for now.  Begin Cipro antibiotic, 1 tablet twice daily for a urinary tract infection.    Go for the xray of your abdomen today.   We will call with lab results.    When I see you back, if you still have high blood pressure readings ,then we will need to start back on medication .  I suspect your blood pressures are routinely elevated.

## 2012-08-07 ENCOUNTER — Encounter: Payer: Self-pay | Admitting: Gastroenterology

## 2012-08-07 ENCOUNTER — Telehealth: Payer: Self-pay | Admitting: Family Medicine

## 2012-08-07 LAB — CBC WITH DIFFERENTIAL/PLATELET
Basophils Absolute: 0 10*3/uL (ref 0.0–0.1)
Basophils Relative: 0 % (ref 0–1)
Hemoglobin: 14.3 g/dL (ref 13.0–17.0)
MCHC: 33.8 g/dL (ref 30.0–36.0)
Monocytes Relative: 5 % (ref 3–12)
Neutro Abs: 6 10*3/uL (ref 1.7–7.7)
Neutrophils Relative %: 76 % (ref 43–77)
RDW: 15.2 % (ref 11.5–15.5)

## 2012-08-07 LAB — COMPREHENSIVE METABOLIC PANEL
ALT: <8 U/L (ref 0–53)
AST: 12 U/L (ref 0–37)
Albumin: 3.9 g/dL (ref 3.5–5.2)
Alkaline Phosphatase: 83 U/L (ref 39–117)
Potassium: 3.9 mEq/L (ref 3.5–5.3)
Sodium: 141 mEq/L (ref 135–145)
Total Protein: 7 g/dL (ref 6.0–8.3)

## 2012-08-07 NOTE — Telephone Encounter (Signed)
Patient is aware of his appointment to see Dr. Christella Hartigan on September 12, 2012 @ 1030 am and pre-op on August 29, 2012 @ 400 pm. [facility] [phone]

## 2012-08-29 ENCOUNTER — Ambulatory Visit (AMBULATORY_SURGERY_CENTER): Payer: BC Managed Care – PPO | Admitting: *Deleted

## 2012-08-29 VITALS — Ht 70.0 in | Wt 253.4 lb

## 2012-08-29 DIAGNOSIS — R109 Unspecified abdominal pain: Secondary | ICD-10-CM

## 2012-08-29 NOTE — Progress Notes (Signed)
Pt states he is having lots of abdominal pain that worsens when he eats about 35-60 minutes afterwards and that he has had this pain for 1 year since he had a GIB in 2013 that required surgery. Dr Marina Goodell did an egd in 2013 and found a gastric mass at that time. Pt denies bleeding . was scheduled for screening colon with dr Christella Hartigan and this has been cancelled and an OV has been scheduled with dr Marina Goodell for 09-11-12 at 3pm. ewm

## 2012-09-11 ENCOUNTER — Encounter: Payer: Self-pay | Admitting: Internal Medicine

## 2012-09-11 ENCOUNTER — Ambulatory Visit (INDEPENDENT_AMBULATORY_CARE_PROVIDER_SITE_OTHER): Payer: BC Managed Care – PPO | Admitting: Internal Medicine

## 2012-09-11 VITALS — BP 140/90 | HR 75 | Ht 70.0 in | Wt 253.2 lb

## 2012-09-11 DIAGNOSIS — K219 Gastro-esophageal reflux disease without esophagitis: Secondary | ICD-10-CM

## 2012-09-11 DIAGNOSIS — R1084 Generalized abdominal pain: Secondary | ICD-10-CM

## 2012-09-11 DIAGNOSIS — R1310 Dysphagia, unspecified: Secondary | ICD-10-CM

## 2012-09-11 DIAGNOSIS — R198 Other specified symptoms and signs involving the digestive system and abdomen: Secondary | ICD-10-CM

## 2012-09-11 MED ORDER — MOVIPREP 100 G PO SOLR: 1.0000 | Freq: Once | ORAL | Status: DC

## 2012-09-11 NOTE — Patient Instructions (Addendum)

## 2012-09-11 NOTE — Progress Notes (Signed)
HISTORY OF PRESENT ILLNESS:  John Keller is a 57 y.o. male with a history of hypertension and depression who is referred by his primary provider to evaluate chronic multiple GI complaints. I saw the patient on unassigned call 05/05/2011 to evaluate symptomatic melena. He was hospitalized and underwent upper endoscopy which revealed a proximal submucosal gastric mass with central ulceration. Otherwise normal exam. The following day he underwent partial gastrectomy with complete resection of a 2 cm GIST tumor. He required no further therapy and recovered uneventfully. She tells me that over the past 10 months he has been plagued with daily abdominal cramping and bloating. Often exacerbated by meals. His bowels tend to be constipated for which he takes laxatives several times per month. He occasionally has diarrhea (once or twice per month). Occasional abdominal pain, but mostly bloating discomfort. No weight loss. Occasional minor rectal bleeding which is bright red. Some mild nausea. Some reflux symptoms and vague dysphagia. He recently established with [facility] and was evaluated as a new patient in 08/06/2012. At that time he was diagnosed with a urinary tract infection for which she was placed on ciprofloxacin. As well, 2 weeks of Dexilant for his GI complaints. He did feel the Dexilant helped. Review of outside laboratories from that they reveal normal CBC and normal comprehensive metabolic panel. 1 view KUB that day was normal. This appointment set up. Patient does admit to having some similar chronic abdominal complaints intermittently over the years. In addition to meals, symptoms are exacerbated by stress. Apparently had some remote evaluation in [place]. He reports colonoscopy at least 10 years ago. Not sure with whom... Does take several medications for depression and insomnia. He denies alcohol or tobacco use. He works 2-1/2 jobs.  REVIEW OF SYSTEMS:  All non-GI ROS negative  except for depression  Past Medical History  Diagnosis Date  . Anemia 2009    noted during hospitalization for surgical I&D of chest, arm abscess  . Depression   . 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 GI bleed 2013  . Hypertension     Past Surgical History  Procedure Laterality Date  . Incise and drain abcess  2009    patient had abscesses and cellulitis of the chest and arm which grew out microfollicular strap.  . Esophagogastroduodenoscopy  05/05/2011    Procedure: ESOPHAGOGASTRODUODENOSCOPY (EGD);  Surgeon: Hilarie Fredrickson, MD;  Location: [facility] ENDOSCOPY;  Service: Endoscopy;  Laterality: N/A;  . Gastrectomy      Social History John Keller  reports that he has never smoked. He has never used smokeless tobacco. He reports that he does not drink alcohol or use illicit drugs.  family history includes Aneurysm in his father; Diabetes in his mother; and Stroke in his mother.  There is no history of Anesthesia problems, and Hypotension, and Malignant hyperthermia, and Pseudochol deficiency, .  No Known Allergies     PHYSICAL EXAMINATION: Vital signs: BP 140/90  Pulse 75  Ht 5\' 10"  (1.778 m)  Wt 253 lb 3.2 oz (114.851 kg)  BMI 36.33 kg/m2  SpO2 98%  Constitutional: generally well-appearing, no acute distress Psychiatric: alert and oriented x3, cooperative. Flat affect Eyes: extraocular movements intact, anicteric, conjunctiva pink Mouth: oral pharynx moist, no lesions Neck: supple no lymphadenopathy Cardiovascular: heart regular rate and rhythm, no murmur Lungs: clear to auscultation bilaterally Abdomen: soft, nontender, nondistended, no obvious ascites, no peritoneal signs, normal bowel sounds, no organomegaly. Surgical incision well-healed Rectal: Deferred until colonoscopy Extremities: no  lower extremity edema bilaterally Skin: no lesions on visible extremities Neuro: No focal deficits. No asterixis.   ASSESSMENT:  #1. Chronic abdominal complaints manifested by cramping,  bloating, and alternating bowel habits with a tendency toward constipation. Most likely IBS #2. Some dyspeptic symptoms suggesting GERD. Vague dysphagia. Rule out peptic stricture #3. 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 Minor intermittent rectal bleeding #5. Remote colonoscopy greater than 10 years ago   PLAN:  #1. Upper endoscopy to evaluate dyspeptic symptoms and dysphagia.The nature of the procedure, as well as the risks, benefits, and alternatives were carefully and thoroughly reviewed with the patient. Ample time for discussion and questions allowed. The patient understood, was satisfied, and agreed to proceed. #2. Colonoscopy to evaluate lower GI complaints, alternating bowel habits, minor bleeding, and provide colon cancer screening (as it has been greater than 10 years since his previous colonoscopy).The nature of the procedure, as well as the risks, benefits, and alternatives were carefully and thoroughly reviewed with the patient. Ample time for discussion and questions allowed. The patient understood, was satisfied, and agreed to proceed. #3. Movi prep prescribed. The patient instructed on his use #4. Will likely need reflux precautions and PPI post EGD #5. Will likely need IBS regimen as colonoscopy #6. Could consider advanced imaging if needed

## 2012-09-12 ENCOUNTER — Other Ambulatory Visit: Payer: BC Managed Care – PPO | Admitting: Gastroenterology

## 2012-10-02 ENCOUNTER — Encounter: Payer: BC Managed Care – PPO | Admitting: Internal Medicine

## 2012-10-16 ENCOUNTER — Encounter: Payer: Self-pay | Admitting: Internal Medicine

## 2012-10-16 ENCOUNTER — Ambulatory Visit (AMBULATORY_SURGERY_CENTER): Payer: BC Managed Care – PPO | Admitting: Internal Medicine

## 2012-10-16 VITALS — BP 143/86 | HR 70 | Temp 97.4°F | Resp 20 | Ht 70.0 in | Wt 253.0 lb

## 2012-10-16 DIAGNOSIS — K219 Gastro-esophageal reflux disease without esophagitis: Secondary | ICD-10-CM

## 2012-10-16 DIAGNOSIS — K2960 Other gastritis without bleeding: Secondary | ICD-10-CM

## 2012-10-16 DIAGNOSIS — R198 Other specified symptoms and signs involving the digestive system and abdomen: Secondary | ICD-10-CM

## 2012-10-16 DIAGNOSIS — Z1211 Encounter for screening for malignant neoplasm of colon: Secondary | ICD-10-CM

## 2012-10-16 DIAGNOSIS — R1310 Dysphagia, unspecified: Secondary | ICD-10-CM

## 2012-10-16 MED ORDER — SODIUM CHLORIDE 0.9 % IV SOLN: 500.0000 mL | INTRAVENOUS | Status: DC

## 2012-10-16 MED ORDER — OMEPRAZOLE 40 MG PO CPDR: 40.0000 mg | DELAYED_RELEASE_CAPSULE | Freq: Every day | ORAL | Status: DC

## 2012-10-16 NOTE — Op Note (Signed)
[facility] [address] [place], [address]   ENDOSCOPY PROCEDURE REPORT  PATIENT: John Keller, John Keller  MR#: [id number] BIRTHDATE: 09/27/1955 , 57  yrs. old GENDER: Male ENDOSCOPIST: Roxy Cedar, MD REFERRED BY:  Kristian Covey, PA-C PROCEDURE DATE:  10/16/2012 PROCEDURE:  EGD w/ biopsy ASA CLASS:     Class II INDICATIONS:  History of esophageal reflux.   Dyspepsia. MEDICATIONS: MAC sedation, administered by CRNA and propofol (Diprivan) 300mg  IV TOPICAL ANESTHETIC: none  DESCRIPTION OF PROCEDURE: After the risks benefits and alternatives of the procedure were thoroughly explained, informed consent was obtained.  The LB [id number] endoscope was introduced through the mouth and advanced to the second portion of the duodenum. Without limitations.  The instrument was slowly withdrawn as the mucosa was fully examined.     EXAM:The esophagus revealed mild distal esophagitis as manifested by edema at the mucosal Z line.  Otherwise normal esophagus.  The stomach revealed evidence of prior gastric surgery in the proximal portion.  The gastric antrum revealed multiple erosions.  Biopsy was taken for H.  pylori.  The duodenal bulb and post bulbar duodenum were normal.  Retroflexed views revealed no abnormalities. The scope was then withdrawn from the patient and the procedure completed.  COMPLICATIONS: There were no complications. ENDOSCOPIC IMPRESSION: 1. GERD with mild esophagitis 2. Erosive gastritis 3. Status post partial proximal gastric resection.  RECOMMENDATIONS: 1. Prescribe omeprazole 40 mg daily; #30; 11 refills 2. Treat for H. pylori if biopsy positive 3. Routine office followup in 4-6 weeks to assess response to therapy.  REPEAT EXAM:  eSigned:  Roxy Cedar, MD 10/16/2012 4:18 PM   CC:The Patient  ;[name],MD

## 2012-10-16 NOTE — Progress Notes (Signed)
Called to room to assist during endoscopic procedure.  Patient ID and intended procedure confirmed with present staff. Received instructions for my participation in the procedure from the performing physician.  

## 2012-10-16 NOTE — Patient Instructions (Addendum)

## 2012-10-16 NOTE — Op Note (Signed)
[facility] [address] [place], [address]   COLONOSCOPY PROCEDURE REPORT  PATIENT: John Keller, John Keller  MR#: [id number] BIRTHDATE: 1955-12-24 , 57  yrs. old GENDER: Male ENDOSCOPIST: Roxy Cedar, MD REFERRED BY:.  Self / Office PROCEDURE DATE:  10/16/2012 PROCEDURE:   Colonoscopy, screening First Screening Colonoscopy - Avg.  risk and is 50 yrs.  old or older - No.  Prior Negative Screening - Now for repeat screening. 10 or more years since last screening  History of Adenoma - Now for follow-up colonoscopy & has been > or = to 3 yrs.  N/A  Polyps Removed Today? No.  Recommend repeat exam, <10 yrs? No. ASA CLASS:   Class II INDICATIONS:average risk screening.   Patient reports negative examination in 10 years ago in [place] MEDICATIONS: MAC sedation, administered by CRNA and propofol (Diprivan) 300mg  IV  DESCRIPTION OF PROCEDURE:   After the risks benefits and alternatives of the procedure were thoroughly explained, informed consent was obtained.  A digital rectal exam revealed no abnormalities of the rectum.   The LB [id number]  endoscope was introduced through the anus and advanced to the cecum, which was identified by both the appendix and ileocecal valve. No adverse events experienced.   The quality of the prep was excellent, using MoviPrep  The instrument was then slowly withdrawn as the colon was fully examined.      COLON FINDINGS: A normal appearing cecum, ileocecal valve, and appendiceal orifice were identified.  The ascending, hepatic flexure, transverse, splenic flexure, descending, sigmoid colon and rectum appeared unremarkable.  No polyps or cancers were seen. Retroflexed views revealed no abnormalities. The time to cecum=2 minutes 52 seconds.  Withdrawal time=11 minutes 54 seconds.  The scope was withdrawn and the procedure completed.  COMPLICATIONS: There were no complications.  ENDOSCOPIC IMPRESSION: 1. Normal  colon  RECOMMENDATIONS: 1.  Continue current colorectal screening recommendations for "routine risk" patients with a repeat colonoscopy in 10 years. 2.  Upper endoscopy (see report)   eSigned:  Roxy Cedar, MD 10/16/2012 4:02 PM   cc: The Patient    , Ahnaf Caponi. Tysinger,MD

## 2012-10-17 ENCOUNTER — Telehealth: Payer: Self-pay | Admitting: *Deleted

## 2012-10-17 NOTE — Telephone Encounter (Signed)
No answer. Name identifier. Message left to call if any questions or concerns. 

## 2012-10-22 ENCOUNTER — Encounter: Payer: Self-pay | Admitting: Internal Medicine

## 2012-11-05 ENCOUNTER — Telehealth: Payer: Self-pay | Admitting: Internal Medicine

## 2012-11-05 NOTE — Telephone Encounter (Signed)
Pt states that he has been having LLQ abdominal pain after he eats. States it does not make a difference if he eats something light or not. Pt recently had EGD and Colon done. Pt scheduled to see Doug Sou PA 11/09/12@9 :30am. Pt aware of appt date and time.

## 2012-11-09 ENCOUNTER — Encounter: Payer: Self-pay | Admitting: Gastroenterology

## 2012-11-09 ENCOUNTER — Ambulatory Visit (INDEPENDENT_AMBULATORY_CARE_PROVIDER_SITE_OTHER): Payer: BC Managed Care – PPO | Admitting: Gastroenterology

## 2012-11-09 ENCOUNTER — Telehealth: Payer: Self-pay | Admitting: *Deleted

## 2012-11-09 VITALS — BP 126/82 | HR 70 | Ht 70.0 in | Wt 244.8 lb

## 2012-11-09 DIAGNOSIS — D214 Benign neoplasm of connective and other soft tissue of abdomen: Secondary | ICD-10-CM

## 2012-11-09 DIAGNOSIS — R109 Unspecified abdominal pain: Secondary | ICD-10-CM

## 2012-11-09 MED ORDER — DICYCLOMINE HCL 20 MG PO TABS: ORAL_TABLET | ORAL | Status: DC

## 2012-11-09 NOTE — Patient Instructions (Addendum)
Please go to the basement level to have your labs drawn.  We rescheduled the office visit with Dr. Marina Goodell to 12-05-2012 at 10:45 am. We sent a prescription for Bentyl 20 mg to [facility] [address]. Take 1 tab twcie daily as needed for cramping and spasms.   You have been scheduled for a CT scan of the abdomen and pelvis at [facility] ([address]---this is in the same building as Architectural technologist).   You are scheduled on 11-13-2012 at 9:00 am  You should arrive at 8:45 am prior to your appointment time for registration. Please follow the written instructions below on the day of your exam:  WARNING: IF YOU ARE ALLERGIC TO IODINE/X-RAY DYE, PLEASE NOTIFY RADIOLOGY IMMEDIATELY AT [phone]! YOU WILL BE GIVEN A 13 HOUR PREMEDICATION PREP.  1) Do not eat or drink anything after  5:00 am .  (4 hours prior to your test) 2) You have been given 2 bottles of oral contrast to drink. The solution may taste  better if refrigerated, but do NOT add ice or any other liquid to this solution. Shake well before drinking.    Drink 1 bottle of contrast @ 7:00 am (2 hours prior to your exam)  Drink 1 bottle of contrast @ 8:00 am  (1 hour prior to your exam)  You may take any medications as prescribed with a small amount of water except for the following: Metformin, Glucophage, Glucovance, Avandamet, Riomet, Fortamet, Actoplus Met, Janumet, Glumetza or Metaglip. The above medications must be held the day of the exam AND 48 hours after the exam.  The purpose of you drinking the oral contrast is to aid in the visualization of your intestinal tract. The contrast solution may cause some diarrhea. Before your exam is started, you will be given a small amount of fluid to drink. Depending on your individual set of symptoms, you may also receive an intravenous injection of x-ray contrast/dye. Plan on being at [facility] for 30 minutes or long, depending on the type of exam you are having  performed.  If you have any questions regarding your exam or if you need to reschedule, you may call the CT department at [phone] between the hours of 8:00 am and 5:00 pm, Monday-Friday.  ________________________________________________________________________

## 2012-11-09 NOTE — Telephone Encounter (Signed)
I called [facility] at [phone] and spoke to [facility]. She sees the referral and will call the patient to make the appointment.

## 2012-11-09 NOTE — Progress Notes (Signed)
Agree with assessment and plans. Case discussed with physician assistant

## 2012-11-09 NOTE — Progress Notes (Signed)
11/09/2012 John Keller [id number] 21-Jan-1956   History of Present Illness:  This patient is a 57 y.o. Male who is known to Dr. 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  He had been seen again by Dr. Marina Goodell in July of this year with complaints of abdominal pain, often exacerbated by meals.  He also had some mild nausea. Some reflux symptoms and vague dysphagia.  He underwent EGD, which showed post surgical changes and some GERD with mild esophagitis and erosive gastritis; biopsies were negative for Hpylori.  He was placed on omeprazole 40 mg daily and is still taking that.  He also had a colonoscopy, which was normal.  He returns today with the same complaints of left sided abdominal pain, worsened by eating and lying back.  Says that it is constant but worsens with sharp pains intermittently.  It was thought that he may have IBS, but with history of GIST and surgery for that, it was suggested that it the pain continued then further imaging may be warranted.     Current Medications, Allergies, Past Medical History, Past Surgical History, Family History and Social History were reviewed in [employer] record.   Physical Exam: BP 126/82  Pulse 70  Ht 5\' 10"  (1.778 m)  Wt 244 lb 12.8 oz (111.041 kg)  BMI 35.13 kg/m2  SpO2 98% General: Well developed male in no acute distress Head: Normocephalic and atraumatic Eyes:  Sclerae anicteric, conjunctiva pink  Ears: Normal auditory acuity Lungs: Clear throughout to auscultation Heart: Regular rate and rhythm Abdomen: Soft, non-distended.  BS present.  Laparotomy scar noted.  Left sided TTP but abdomen benign with no R/R/G. Musculoskeletal: Symmetrical with no gross deformities  Extremities: No edema  Neurological: Alert oriented x 4. Psychological:  Alert and cooperative. Normal mood and affect.  Somewhat slow mentation.  Assessment  and Recommendations: -Abdominal pain:  Left sided.  Constant but comes on sharp at times, mostly with eating and lying back.  ? IBS versus adhesive disease related to previous laparotomy vs other etiologies.  Will check CT scan of the abdomen and pelvis with contrast.  BMP today.  In the interim we will give Bentyl 20 mg BID before meals.  Follow-up in several weeks.  He is also asking for a nutrition/dietary consult to help manage his poor eating habits. -History of GIST

## 2012-11-13 ENCOUNTER — Ambulatory Visit (INDEPENDENT_AMBULATORY_CARE_PROVIDER_SITE_OTHER)
Admission: RE | Admit: 2012-11-13 | Discharge: 2012-11-13 | Disposition: A | Discharge status: Home / Self Care | Payer: BC Managed Care – PPO | Source: Ambulatory Visit | Attending: Gastroenterology | Admitting: Gastroenterology

## 2012-11-13 DIAGNOSIS — R109 Unspecified abdominal pain: Secondary | ICD-10-CM

## 2012-11-13 DIAGNOSIS — D214 Benign neoplasm of connective and other soft tissue of abdomen: Secondary | ICD-10-CM

## 2012-11-13 MED ORDER — IOHEXOL 300 MG/ML  SOLN
100.0000 mL | Freq: Once | INTRAMUSCULAR | Status: AC | PRN
  Administered 2012-11-13: 100 mL via INTRAVENOUS

## 2012-11-16 ENCOUNTER — Other Ambulatory Visit: Payer: Self-pay

## 2012-11-16 DIAGNOSIS — N2889 Other specified disorders of kidney and ureter: Secondary | ICD-10-CM

## 2012-11-19 ENCOUNTER — Ambulatory Visit (HOSPITAL_COMMUNITY)
Admission: RE | Admit: 2012-11-19 | Discharge: 2012-11-19 | Disposition: A | Discharge status: Home / Self Care | Payer: BC Managed Care – PPO | Source: Ambulatory Visit | Attending: Family Medicine | Admitting: Family Medicine

## 2012-11-19 ENCOUNTER — Ambulatory Visit: Payer: BC Managed Care – PPO | Admitting: Internal Medicine

## 2012-11-19 DIAGNOSIS — N2889 Other specified disorders of kidney and ureter: Secondary | ICD-10-CM

## 2012-11-19 DIAGNOSIS — N289 Disorder of kidney and ureter, unspecified: Secondary | ICD-10-CM | POA: Insufficient documentation

## 2012-11-20 ENCOUNTER — Other Ambulatory Visit: Payer: Self-pay

## 2012-11-20 DIAGNOSIS — N2889 Other specified disorders of kidney and ureter: Secondary | ICD-10-CM

## 2012-11-25 ENCOUNTER — Ambulatory Visit
Admission: RE | Admit: 2012-11-25 | Discharge: 2012-11-25 | Disposition: A | Discharge status: Home / Self Care | Payer: BC Managed Care – PPO | Source: Ambulatory Visit | Attending: Family Medicine | Admitting: Family Medicine

## 2012-11-25 DIAGNOSIS — N2889 Other specified disorders of kidney and ureter: Secondary | ICD-10-CM

## 2012-11-25 MED ORDER — GADOBENATE DIMEGLUMINE 529 MG/ML IV SOLN
20.0000 mL | Freq: Once | INTRAVENOUS | Status: AC | PRN
  Administered 2012-11-25: 20 mL via INTRAVENOUS

## 2012-11-26 ENCOUNTER — Ambulatory Visit: Payer: BC Managed Care – PPO | Admitting: *Deleted

## 2012-12-05 ENCOUNTER — Ambulatory Visit: Payer: BC Managed Care – PPO | Admitting: Internal Medicine

## 2012-12-24 ENCOUNTER — Encounter: Payer: BC Managed Care – PPO | Attending: Gastroenterology | Admitting: *Deleted

## 2012-12-24 ENCOUNTER — Encounter: Payer: Self-pay | Admitting: *Deleted

## 2012-12-24 DIAGNOSIS — Z713 Dietary counseling and surveillance: Secondary | ICD-10-CM | POA: Insufficient documentation

## 2012-12-24 DIAGNOSIS — E669 Obesity, unspecified: Secondary | ICD-10-CM

## 2012-12-24 NOTE — Patient Instructions (Signed)
Limit: milk, caffeine, sugar, high fat foods, spicy foods  Make healthier choices when eating out

## 2012-12-24 NOTE — Progress Notes (Signed)
  Medical Nutrition Therapy:  Appt start time: 0900 end time:  1000.  Assessment:  Primary concerns today: John Keller is here for nutrition counseling pertaining to poor eating habits.  He had a stomach tumor removed last year.  He isn't able to eat well due to pain, nausea and some vomiting.  He has a busy schedule and isn't able to eat scheduled meals.  He states he has lost over 30 pounds, but medical record shows about a 15 pound weight loss over the year.   He seems to have a mild intellectual disabilities.  He kept stating that he doesn't have time to make changes, he doesn't have time  Preferred Learning Style:  Visual   Learning Readiness:   Not ready  MEDICATIONS: see list   DIETARY INTAKE:  Usual eating pattern includes 2 meals and 1-2 snacks per day.  Everyday foods include fast food, convenience foods.  Avoided foods include none.    24-hr recall:  B ( AM): cereal with 2% lactaid milk with toast or english muffin.  Skips 4 days  Snk ( AM): Gatorade with doughnut if he skips breakfast  L ( PM): might skip Snk ( PM): fast food: burger and fries  Snk ( PM): chips or candy bar Beverages: Gatorade, sweet tea, soda, water, sometimes juice  Usual physical activity: nothing outside of ADLs. Works 2 jobs.  Tries to park farther away to get some more walking.   Estimated energy needs: 1800 calories 200 g carbohydrates 135 g protein 50 g fat    Nutritional Diagnosis:  NB-1.3 Not ready for diet/lifestyle change  As related to meal planning and cooking at home.  As evidenced by busy schedule.    Intervention:  Nutrition counseling provided.  Encouraged John Keller to limit offending foods: caffeine, sucrose, lactose, fructose, high-fat foods, and caffeine.  Since he is not in a position to make real changes, I suggested he eat and drink smarter when he eats out  Teaching Method Utilized:  Visual Auditory  Handouts given during visit include:  Fast Food handout   Barriers to  learning/adherence to lifestyle change: busy schedule  Demonstrated degree of understanding via:  Teach Back   Monitoring/Evaluation:  Dietary intake, exercise,  and body weight prn.

## 2013-01-16 ENCOUNTER — Encounter (HOSPITAL_COMMUNITY): Payer: Self-pay | Admitting: Emergency Medicine

## 2013-01-16 ENCOUNTER — Emergency Department (INDEPENDENT_AMBULATORY_CARE_PROVIDER_SITE_OTHER): Payer: BC Managed Care – PPO

## 2013-01-16 ENCOUNTER — Emergency Department (HOSPITAL_COMMUNITY)
Admission: EM | Admit: 2013-01-16 | Discharge: 2013-01-16 | Disposition: A | Discharge status: Home / Self Care | Payer: BC Managed Care – PPO | Source: Home / Self Care

## 2013-01-16 DIAGNOSIS — M7041 Prepatellar bursitis, right knee: Secondary | ICD-10-CM

## 2013-01-16 DIAGNOSIS — M25469 Effusion, unspecified knee: Secondary | ICD-10-CM

## 2013-01-16 DIAGNOSIS — M7040 Prepatellar bursitis, unspecified knee: Secondary | ICD-10-CM

## 2013-01-16 DIAGNOSIS — M25461 Effusion, right knee: Secondary | ICD-10-CM

## 2013-01-16 MED ORDER — HYDROCODONE-ACETAMINOPHEN 5-325 MG PO TABS: 1.0000 | ORAL_TABLET | ORAL | Status: DC | PRN

## 2013-01-16 MED ORDER — METHYLPREDNISOLONE 4 MG PO KIT: PACK | ORAL | Status: DC

## 2013-01-16 NOTE — ED Provider Notes (Signed)
Limited musculoskeletal ultrasound of the right knee. Quad tendon is intact. There appears to be a moderate joint effusion. Patella tendon is intact. There is a hypoechoic fluid collection overlying the inferior patella pole consistent with prepatellar bursitis. The medial and lateral meniscus are grossly normal. This ultrasound is consistent with prepatellar bursitis and moderate effusion.  Rodolph Bong, MD 01/16/13 [phone]

## 2013-01-16 NOTE — ED Provider Notes (Signed)
Medical screening examination/treatment/procedure(s) were performed by non-physician practitioner and as supervising physician I was immediately available for consultation/collaboration.  Leslee Home, M.D.  Reuben Likes, MD 01/16/13 [phone]

## 2013-01-16 NOTE — ED Provider Notes (Signed)
CSN: [id number]     Arrival date & time 01/16/13  1307 History   First MD Initiated Contact with Patient 01/16/13 1359     Chief Complaint  Patient presents with  . Knee Pain   (Consider location/radiation/quality/duration/timing/severity/associated sxs/prior Treatment) HPI Comments: 57 year old male presents with right knee pain it started approximately 1-1/2 weeks ago which was 2 days after receiving a flu shot. He states he cannot extend it. He also experienced some sweating bodyaches and pains same day the pain started 2 days post flu shot. He denies trauma or known injury. St his work involves standing and walking  on his feet a lot.  He points to in direct anterior aspect of the knee as the source of pain.  Later, during the U/S the pt was asked what his worked consisted of and then st he often works on his knees.    Past Medical History  Diagnosis Date  . Anemia 2009    noted during hospitalization for surgical I&D of chest, arm abscess  . Depression   . 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 GI bleed 2013  . Hypertension   . GERD (gastroesophageal reflux disease)    Past Surgical History  Procedure Laterality Date  . Incise and drain abcess  2009    patient had abscesses and cellulitis of the chest and arm which grew out microfollicular strap.  . Esophagogastroduodenoscopy  05/05/2011    Procedure: ESOPHAGOGASTRODUODENOSCOPY (EGD);  Surgeon: Hilarie Fredrickson, MD;  Location: [facility] ENDOSCOPY;  Service: Endoscopy;  Laterality: N/A;  . Gastrectomy     Family History  Problem Relation Age of Onset  . Anesthesia problems Neg Hx   . Hypotension Neg Hx   . Malignant hyperthermia Neg Hx   . Pseudochol deficiency Neg Hx   . Diabetes Mother   . Stroke Mother   . Aneurysm Father     died of brain aneurysm   History  Substance Use Topics  . Smoking status: Never Smoker   . Smokeless tobacco: Never Used  . Alcohol Use: No    Review of Systems  Constitutional: Negative.    Respiratory: Negative.   Gastrointestinal: Negative.   Genitourinary: Negative.   Musculoskeletal: Positive for arthralgias and gait problem.       As per HPI  Skin: Negative.   All other systems reviewed and are negative.    Allergies  Review of patient's allergies indicates no known allergies.  Home Medications   Current Outpatient Rx  Name  Route  Sig  Dispense  Refill  . buPROPion (WELLBUTRIN XL) 150 MG 24 hr tablet   Oral   Take 150 mg by mouth daily.         . citalopram (CELEXA) 40 MG tablet   Oral   Take 40 mg by mouth daily.         Marland Kitchen dicyclomine (BENTYL) 20 MG tablet      Take 1 tab twice daily.   60 tablet   1   . HYDROcodone-acetaminophen (NORCO/VICODIN) 5-325 MG per tablet   Oral   Take 1 tablet by mouth every 4 (four) hours as needed.   15 tablet   0   . hydroxypropyl methylcellulose (ISOPTO TEARS) 2.5 % ophthalmic solution   Both Eyes   Place 1 drop into both eyes 4 (four) times daily as needed. For dry eyes         . methylPREDNISolone (MEDROL DOSEPAK) 4 MG tablet  follow package directions   21 tablet   0   . MOVIPREP 100 G SOLR   Oral   Take 1 kit (100 g total) by mouth once.   1 kit   0     Dispense as written.   . Multiple Vitamin (MULITIVITAMIN WITH MINERALS) TABS   Oral   Take 1 tablet by mouth daily.         Marland Kitchen omeprazole (PRILOSEC) 40 MG capsule   Oral   Take 1 capsule (40 mg total) by mouth daily.   30 capsule   11     Dispense as written.   . traZODone (DESYREL) 50 MG tablet   Oral   Take 50 mg by mouth at bedtime.          BP 141/93  Pulse 69  Temp(Src) 97.4 F (36.3 C) (Oral)  Resp 20  SpO2 98% Physical Exam  Nursing note and vitals reviewed. Constitutional: He is oriented to person, place, and time. He appears well-developed and well-nourished.  Eyes: EOM are normal. Left eye exhibits no discharge.  Neck: Normal range of motion. Neck supple.  Pulmonary/Chest: Effort normal.   Musculoskeletal: He exhibits tenderness.  Right knee with minimal anterior swelling. Tenderness of the patellofemoral ligament, patella, anterior proximal tibia, tibial tuberosity and along the joint line. Extension is limited to 120. Flexion is limited to 120. Mild increase in warmth but no erythema. Generalized tenderness to the anterior and bilateral aspects of the knee. Minor tenderness to the posterior hamstring tendons. No calf tenderness or swelling.  Neurological: He is alert and oriented to person, place, and time. No cranial nerve deficit.  Skin: Skin is warm and dry.  Psychiatric: He has a normal mood and affect.    ED Course  Procedures (including critical care time) Labs Review Labs Reviewed - No data to display Imaging Review Dg Knee Complete 4 Views Right  01/16/2013   CLINICAL DATA:  Pain and swelling  EXAM: RIGHT KNEE - COMPLETE 4+ VIEW  COMPARISON:  None.  FINDINGS: Frontal, lateral, and bilateral oblique views were obtained. There is no fracture or dislocation. There is evidence of a degree of effusion.  There is mild narrowing medially.  No erosive change.  IMPRESSION: Joint effusion. Mild narrowing medially. No fracture or dislocation.   Electronically Signed   By: Bretta Bang M.D.   On: 01/16/2013 15:04      MDM   1. Prepatellar bursitis, right   2. Knee joint effusion, right     Stay off knees and use knee protectors. Medrol dosepack norco 5 mg #15   Hayden Rasmussen, NP 01/16/13 1530

## 2013-01-16 NOTE — ED Notes (Signed)
Patient getting dressed.

## 2013-01-16 NOTE — ED Notes (Signed)
Right knee pain and limited range of motion.

## 2013-02-12 ENCOUNTER — Encounter: Payer: Self-pay | Admitting: Medical

## 2013-02-12 ENCOUNTER — Ambulatory Visit (INDEPENDENT_AMBULATORY_CARE_PROVIDER_SITE_OTHER): Payer: BC Managed Care – PPO | Admitting: Medical

## 2013-02-12 VITALS — BP 130/82 | HR 70 | Temp 98.2°F | Resp 16 | Wt 242.0 lb

## 2013-02-12 DIAGNOSIS — M25561 Pain in right knee: Secondary | ICD-10-CM

## 2013-02-12 DIAGNOSIS — M25569 Pain in unspecified knee: Secondary | ICD-10-CM

## 2013-02-12 MED ORDER — HYDROCODONE-ACETAMINOPHEN 5-325 MG PO TABS: 1.0000 | ORAL_TABLET | ORAL | Status: DC | PRN

## 2013-02-12 NOTE — Progress Notes (Signed)
Subjective: Here for right knee pain.   Was seen about a month ago at Urgent Care for right knee pain and swelling.  Was given a course of steroid Dosepak, hydrocodone for pain.  Denies specific recent or prior injury, but was having pain going down stairs, bending and squatting.  He is here today because he continues to have quite significant pain and swelling of the right knee ongoing.  He has 2 jobs, on his feet all day, walks throughout the day, lifts various objects throughout the day, works in [employer].  Denies history of surgery of that right knee.  Currently he is using Excedrin, a few pills every 6-8 hours. He denies redness, warmth, numbness, tingling, no other joint pain or swelling, no back pain, no recent fall.  Past Medical History  Diagnosis Date  . Anemia 2009    noted during hospitalization for surgical I&D of chest, arm abscess  . Depression   . 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 GI bleed 2013  . Hypertension   . GERD (gastroesophageal reflux disease)    Past Surgical History  Procedure Laterality Date  . Incise and drain abcess  2009    patient had abscesses and cellulitis of the chest and arm which grew out microfollicular strap.  . Esophagogastroduodenoscopy  05/05/2011    Procedure: ESOPHAGOGASTRODUODENOSCOPY (EGD);  Surgeon: Hilarie Fredrickson, MD;  Location: [facility] ENDOSCOPY;  Service: Endoscopy;  Laterality: N/A;  . Gastrectomy     ROS as in subjective   Objective: General: Well developed, well-nourished, no acute distress Skin: No erythema or ecchymosis, no warmth of knee MSK: Tender medial right knee joint line, pain with knee flexion, pain with McMurray, some pain with extension, no obvious effusion today, otherwise nontender, no other laxity, rest of lower extremity exam unremarkable Extremities, no edema Lower extremities neurovascularly intact   Assessment: Encounter Diagnosis  Name Primary?  . Right knee pain Yes   Plan Discussed case with Dr. Susann Givens  supervising physician, who also examined patient.  We reviewed his recent x-ray imaging which shows medial right knee joint line narrowing, effusion, and some arthritic changes. At this point we will send him for MRI the right knee.  Advise relative rest, ice daily, knee sleeve over-the-counter, refill hydrocodone for pain.  We will use caution with NSAIDs given his history of GI bleed.  Note given for work to limit activities that'll aggravate the knee.  Followup pending MRI.

## 2013-02-15 ENCOUNTER — Ambulatory Visit
Admission: RE | Admit: 2013-02-15 | Discharge: 2013-02-15 | Disposition: A | Discharge status: Home / Self Care | Payer: BC Managed Care – PPO | Source: Ambulatory Visit | Attending: Medical | Admitting: Medical

## 2013-02-15 DIAGNOSIS — M25561 Pain in right knee: Secondary | ICD-10-CM

## 2013-11-27 ENCOUNTER — Other Ambulatory Visit: Payer: Self-pay | Admitting: Medical

## 2013-11-27 ENCOUNTER — Ambulatory Visit (INDEPENDENT_AMBULATORY_CARE_PROVIDER_SITE_OTHER): Payer: BC Managed Care – PPO | Admitting: Medical

## 2013-11-27 ENCOUNTER — Encounter: Payer: Self-pay | Admitting: Medical

## 2013-11-27 VITALS — BP 132/90 | HR 68 | Temp 98.5°F | Resp 16 | Wt 242.0 lb

## 2013-11-27 DIAGNOSIS — R110 Nausea: Secondary | ICD-10-CM

## 2013-11-27 DIAGNOSIS — R195 Other fecal abnormalities: Secondary | ICD-10-CM

## 2013-11-27 DIAGNOSIS — R935 Abnormal findings on diagnostic imaging of other abdominal regions, including retroperitoneum: Secondary | ICD-10-CM

## 2013-11-27 DIAGNOSIS — M545 Low back pain, unspecified: Secondary | ICD-10-CM

## 2013-11-27 DIAGNOSIS — R109 Unspecified abdominal pain: Secondary | ICD-10-CM

## 2013-11-27 DIAGNOSIS — R5381 Other malaise: Secondary | ICD-10-CM

## 2013-11-27 DIAGNOSIS — R5383 Other fatigue: Secondary | ICD-10-CM

## 2013-11-27 DIAGNOSIS — R42 Dizziness and giddiness: Secondary | ICD-10-CM

## 2013-11-27 DIAGNOSIS — N289 Disorder of kidney and ureter, unspecified: Secondary | ICD-10-CM

## 2013-11-27 LAB — HEPATIC FUNCTION PANEL
ALT: 8 U/L (ref 0–53)
AST: 14 U/L (ref 0–37)
Albumin: 3.6 g/dL (ref 3.5–5.2)
Alkaline Phosphatase: 80 U/L (ref 39–117)
BILIRUBIN DIRECT: 0.2 mg/dL (ref 0.0–0.3)
Indirect Bilirubin: 0.6 mg/dL (ref 0.2–1.2)
Total Bilirubin: 0.8 mg/dL (ref 0.2–1.2)
Total Protein: 6.4 g/dL (ref 6.0–8.3)

## 2013-11-27 LAB — POCT URINALYSIS DIPSTICK
BILIRUBIN UA: NEGATIVE
GLUCOSE UA: NEGATIVE
KETONES UA: NEGATIVE
Leukocytes, UA: NEGATIVE
Nitrite, UA: NEGATIVE
Protein, UA: NEGATIVE
RBC UA: NEGATIVE
SPEC GRAV UA: 1.01
UROBILINOGEN UA: NEGATIVE
pH, UA: 8

## 2013-11-27 LAB — CBC WITH DIFFERENTIAL/PLATELET
BASOS ABS: 0 10*3/uL (ref 0.0–0.1)
Basophils Relative: 0 % (ref 0–1)
EOS PCT: 3 % (ref 0–5)
Eosinophils Absolute: 0.2 10*3/uL (ref 0.0–0.7)
HEMATOCRIT: 41.5 % (ref 39.0–52.0)
Hemoglobin: 13.7 g/dL (ref 13.0–17.0)
LYMPHS PCT: 18 % (ref 12–46)
Lymphs Abs: 1.3 10*3/uL (ref 0.7–4.0)
MCH: 29.2 pg (ref 26.0–34.0)
MCHC: 33 g/dL (ref 30.0–36.0)
MCV: 88.5 fL (ref 78.0–100.0)
MONO ABS: 0.4 10*3/uL (ref 0.1–1.0)
Monocytes Relative: 6 % (ref 3–12)
Neutro Abs: 5.1 10*3/uL (ref 1.7–7.7)
Neutrophils Relative %: 73 % (ref 43–77)
Platelets: 229 10*3/uL (ref 150–400)
RBC: 4.69 MIL/uL (ref 4.22–5.81)
RDW: 14.7 % (ref 11.5–15.5)
WBC: 7 10*3/uL (ref 4.0–10.5)

## 2013-11-27 LAB — LIPASE: Lipase: 26 U/L (ref 0–75)

## 2013-11-27 LAB — BASIC METABOLIC PANEL
BUN: 15 mg/dL (ref 6–23)
CO2: 28 meq/L (ref 19–32)
Calcium: 8.7 mg/dL (ref 8.4–10.5)
Chloride: 107 mEq/L (ref 96–112)
Creat: 1.04 mg/dL (ref 0.50–1.35)
GLUCOSE: 89 mg/dL (ref 70–99)
POTASSIUM: 4.3 meq/L (ref 3.5–5.3)
SODIUM: 141 meq/L (ref 135–145)

## 2013-11-27 NOTE — Progress Notes (Signed)
Subjective: Here for not feeling well x 1+ week.  He notes stomach pains, nausea, gas, diarrhea, hasn't felt well in general, some dizziness.   Getting 2-3 loose stools daily the last few days.   Using anti-diarrhea medication OTC.  No sick contacts, no travel, no animal exposure, no recent antibiotics, no concern for food poisoning.  Abdomina pain is somewhat central to lower.  Denies fever, vomiting.  Some urinary frequency, no but urine odor, no burning, no blood in urine.   No back pain.  Using some ibuprofen or Excedrin at times.   Has hx/o UTI, IBS.  Hx/o benign gastric tumor that was resected.  Saw GI last year.   No other aggravating or relieving factors.  No other c/o.  ROS as in subjective  Past Medical History  Diagnosis Date  . Anemia 2009    noted during hospitalization for surgical I&D of chest, arm abscess  . Depression   . 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 GI bleed 2013  . Hypertension   . GERD (gastroesophageal reflux disease)    Past Surgical History  Procedure Laterality Date  . Incise and drain abcess  2009    patient had abscesses and cellulitis of the chest and arm which grew out microfollicular strap.  . Esophagogastroduodenoscopy  05/05/2011    Procedure: ESOPHAGOGASTRODUODENOSCOPY (EGD);  Surgeon: Irene Shipper, MD;  Location: [facility] ENDOSCOPY;  Service: Endoscopy;  Laterality: N/A;  . Gastrectomy       Objective: BP 132/90  Pulse 68  Temp(Src) 98.5 F (36.9 C) (Oral)  Resp 16  Wt 242 lb (109.77 kg)  General appearance: alert, no distress, WD/WN HEENT: normocephalic, sclerae anicteric, TMs pearly, nares patent, no discharge or erythema, pharynx normal Oral cavity: MMM, no lesions Neck: supple, no lymphadenopathy, no thyromegaly, no masses Heart: RRR, normal S1, S2, no murmurs Lungs: CTA bilaterally, no wheezes, rhonchi, or rales Abdomen: +somewhat tympanic bs throughout, soft, tender throughout, mild to moderate, non distended, no masses, no hepatomegaly, no  splenomegaly Pulses: 2+ symmetric, upper and lower extremities, normal cap refill Ext: no edema Back: +right CVA tenderness   Assessment: Encounter Diagnoses  Name Primary?  . Abdominal pain, unspecified site Yes  . Low back pain without sciatica, unspecified back pain laterality   . Nausea alone   . Loose stools   . Dizziness and giddiness   . Other malaise and fatigue    Plan: etiology unclear.  He has hx/o GI bleed, GERD, gastric tumor, IBS.  Reviewed 2014 endoscopy and colonoscopy reports.   STAT labs today, urine sent for culture   Discussed possibility of IBS, viral gastroenteritis, gastritis, diverticulitis although less likely, UTI, or other.   Pending labs, will need repeat MRI abdomen for f/u on kidney lesion seen 2014.  He is past due on f/u for this.  F/u pending studies.

## 2013-11-29 LAB — URINE CULTURE
COLONY COUNT: NO GROWTH
Organism ID, Bacteria: NO GROWTH

## 2013-12-20 ENCOUNTER — Telehealth: Payer: Self-pay | Admitting: Internal Medicine

## 2013-12-20 DIAGNOSIS — K2960 Other gastritis without bleeding: Secondary | ICD-10-CM

## 2013-12-23 MED ORDER — OMEPRAZOLE 40 MG PO CPDR: 40.0000 mg | DELAYED_RELEASE_CAPSULE | Freq: Every day | ORAL | Status: DC

## 2013-12-23 NOTE — Telephone Encounter (Signed)
Refilled Prilosec

## 2015-01-12 ENCOUNTER — Telehealth: Payer: Self-pay | Admitting: Internal Medicine

## 2015-01-12 DIAGNOSIS — K2960 Other gastritis without bleeding: Secondary | ICD-10-CM

## 2015-01-12 MED ORDER — OMEPRAZOLE 40 MG PO CPDR: 40.0000 mg | DELAYED_RELEASE_CAPSULE | Freq: Every day | ORAL | Status: DC

## 2015-01-12 NOTE — Telephone Encounter (Signed)
Omeprazole refilled.  Patient needs office visit for further refills

## 2015-01-17 ENCOUNTER — Emergency Department (HOSPITAL_COMMUNITY): Payer: BLUE CROSS/BLUE SHIELD

## 2015-01-17 ENCOUNTER — Observation Stay (HOSPITAL_COMMUNITY)
Admission: EM | Admit: 2015-01-17 | Discharge: 2015-01-20 | DRG: 690 | Disposition: A | Discharge status: Home / Self Care | Payer: BLUE CROSS/BLUE SHIELD | Attending: Internal Medicine | Admitting: Internal Medicine

## 2015-01-17 ENCOUNTER — Emergency Department (INDEPENDENT_AMBULATORY_CARE_PROVIDER_SITE_OTHER)
Admission: EM | Admit: 2015-01-17 | Discharge: 2015-01-17 | Disposition: A | Discharge status: Nursing Home (Includes ICF) | Payer: BLUE CROSS/BLUE SHIELD | Source: Home / Self Care | Attending: Emergency Medicine | Admitting: Emergency Medicine

## 2015-01-17 ENCOUNTER — Encounter (HOSPITAL_COMMUNITY): Payer: Self-pay | Admitting: *Deleted

## 2015-01-17 DIAGNOSIS — F32A Depression, unspecified: Secondary | ICD-10-CM

## 2015-01-17 DIAGNOSIS — Z8719 Personal history of other diseases of the digestive system: Secondary | ICD-10-CM | POA: Diagnosis not present

## 2015-01-17 DIAGNOSIS — R109 Unspecified abdominal pain: Secondary | ICD-10-CM | POA: Diagnosis present

## 2015-01-17 DIAGNOSIS — R10817 Generalized abdominal tenderness: Secondary | ICD-10-CM | POA: Diagnosis not present

## 2015-01-17 DIAGNOSIS — R1011 Right upper quadrant pain: Secondary | ICD-10-CM | POA: Diagnosis present

## 2015-01-17 DIAGNOSIS — K4020 Bilateral inguinal hernia, without obstruction or gangrene, not specified as recurrent: Secondary | ICD-10-CM | POA: Diagnosis not present

## 2015-01-17 DIAGNOSIS — K37 Unspecified appendicitis: Secondary | ICD-10-CM | POA: Diagnosis present

## 2015-01-17 DIAGNOSIS — K219 Gastro-esophageal reflux disease without esophagitis: Secondary | ICD-10-CM

## 2015-01-17 DIAGNOSIS — D214 Benign neoplasm of connective and other soft tissue of abdomen: Secondary | ICD-10-CM | POA: Diagnosis present

## 2015-01-17 DIAGNOSIS — N390 Urinary tract infection, site not specified: Secondary | ICD-10-CM | POA: Diagnosis not present

## 2015-01-17 DIAGNOSIS — R1084 Generalized abdominal pain: Secondary | ICD-10-CM | POA: Diagnosis not present

## 2015-01-17 DIAGNOSIS — K3589 Other acute appendicitis without perforation or gangrene: Secondary | ICD-10-CM

## 2015-01-17 DIAGNOSIS — K5900 Constipation, unspecified: Secondary | ICD-10-CM | POA: Diagnosis present

## 2015-01-17 DIAGNOSIS — F329 Major depressive disorder, single episode, unspecified: Secondary | ICD-10-CM | POA: Diagnosis not present

## 2015-01-17 DIAGNOSIS — I10 Essential (primary) hypertension: Secondary | ICD-10-CM | POA: Diagnosis present

## 2015-01-17 DIAGNOSIS — R911 Solitary pulmonary nodule: Secondary | ICD-10-CM | POA: Diagnosis present

## 2015-01-17 LAB — URINALYSIS, ROUTINE W REFLEX MICROSCOPIC
BILIRUBIN URINE: NEGATIVE
GLUCOSE, UA: NEGATIVE mg/dL
Hgb urine dipstick: NEGATIVE
KETONES UR: NEGATIVE mg/dL
NITRITE: NEGATIVE
PH: 6 (ref 5.0–8.0)
Protein, ur: NEGATIVE mg/dL
SPECIFIC GRAVITY, URINE: 1.023 (ref 1.005–1.030)

## 2015-01-17 LAB — CBC
HEMATOCRIT: 40.5 % (ref 39.0–52.0)
HEMOGLOBIN: 13.3 g/dL (ref 13.0–17.0)
MCH: 30.2 pg (ref 26.0–34.0)
MCHC: 32.8 g/dL (ref 30.0–36.0)
MCV: 92 fL (ref 78.0–100.0)
Platelets: 205 10*3/uL (ref 150–400)
RBC: 4.4 MIL/uL (ref 4.22–5.81)
RDW: 14.1 % (ref 11.5–15.5)
WBC: 7.5 10*3/uL (ref 4.0–10.5)

## 2015-01-17 LAB — URINE MICROSCOPIC-ADD ON

## 2015-01-17 LAB — COMPREHENSIVE METABOLIC PANEL
ALBUMIN: 3.1 g/dL — AB (ref 3.5–5.0)
ALT: 13 U/L — ABNORMAL LOW (ref 17–63)
ANION GAP: 4 — AB (ref 5–15)
AST: 20 U/L (ref 15–41)
Alkaline Phosphatase: 81 U/L (ref 38–126)
BUN: 13 mg/dL (ref 6–20)
CHLORIDE: 106 mmol/L (ref 101–111)
CO2: 28 mmol/L (ref 22–32)
Calcium: 8.4 mg/dL — ABNORMAL LOW (ref 8.9–10.3)
Creatinine, Ser: 0.95 mg/dL (ref 0.61–1.24)
GFR calc Af Amer: >60 mL/min (ref >60)
GFR calc non Af Amer: >60 mL/min (ref >60)
GLUCOSE: 91 mg/dL (ref 65–99)
POTASSIUM: 4.1 mmol/L (ref 3.5–5.1)
SODIUM: 138 mmol/L (ref 135–145)
TOTAL PROTEIN: 6.5 g/dL (ref 6.5–8.1)
Total Bilirubin: 0.4 mg/dL (ref 0.3–1.2)

## 2015-01-17 LAB — LIPASE, BLOOD: LIPASE: 36 U/L (ref 11–51)

## 2015-01-17 MED ORDER — ONDANSETRON HCL 4 MG/2ML IJ SOLN
4.0000 mg | Freq: Once | INTRAMUSCULAR | Status: AC
  Administered 2015-01-17: 4 mg via INTRAVENOUS
  Filled 2015-01-17: qty 2

## 2015-01-17 MED ORDER — IOHEXOL 300 MG/ML  SOLN
100.0000 mL | Freq: Once | INTRAMUSCULAR | Status: AC | PRN
  Administered 2015-01-17: 100 mL via INTRAVENOUS

## 2015-01-17 MED ORDER — SODIUM CHLORIDE 0.9 % IV BOLUS (SEPSIS)
500.0000 mL | Freq: Once | INTRAVENOUS | Status: AC
  Administered 2015-01-17: 500 mL via INTRAVENOUS

## 2015-01-17 MED ORDER — DEXTROSE 5 % IV SOLN
1.0000 g | Freq: Once | INTRAVENOUS | Status: AC
  Administered 2015-01-18: 1 g via INTRAVENOUS
  Filled 2015-01-17: qty 10

## 2015-01-17 MED ORDER — ONDANSETRON HCL 4 MG PO TABS: 4.0000 mg | ORAL_TABLET | Freq: Four times a day (QID) | ORAL | Status: DC

## 2015-01-17 MED ORDER — FENTANYL CITRATE (PF) 100 MCG/2ML IJ SOLN
50.0000 ug | Freq: Once | INTRAMUSCULAR | Status: AC
  Administered 2015-01-17: 50 ug via INTRAVENOUS
  Filled 2015-01-17: qty 2

## 2015-01-17 MED ORDER — METRONIDAZOLE IN NACL 5-0.79 MG/ML-% IV SOLN
500.0000 mg | Freq: Once | INTRAVENOUS | Status: AC
  Administered 2015-01-18: 500 mg via INTRAVENOUS
  Filled 2015-01-17: qty 100

## 2015-01-17 NOTE — ED Provider Notes (Signed)
CSN: [id number]     Arrival date & time 01/17/15  1743 History   First MD Initiated Contact with Patient 01/17/15 1818     Chief Complaint  Patient presents with  . Abdominal Pain   HPI   John Keller is a 59 y.o. M PMH significant for GERD, GIB, HTN, depression, gastric tumor presenting with a one week history of abdominal pain for a week. He describes it as severe, cramping, constant, 6/10, non-radiating. He took a MOM for constipation yesterday but his cramps became worse and he developed diarrhea. He endorses nausea and urinary incontinence. No fevers, hematochezia, emesis, recent antibiotic use, recent surgeries, ill contacts, raw/undercooked foods.     He is a poor historian and required redirecting back to the HPI.   Past Medical History  Diagnosis Date  . Anemia 2009    noted during hospitalization for surgical I&D of chest, arm abscess  . Depression   . 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 GI bleed 2013  . Hypertension   . GERD (gastroesophageal reflux disease)    Past Surgical History  Procedure Laterality Date  . Incise and drain abcess  2009    patient had abscesses and cellulitis of the chest and arm which grew out microfollicular strap.  . Esophagogastroduodenoscopy  05/05/2011    Procedure: ESOPHAGOGASTRODUODENOSCOPY (EGD);  Surgeon: Irene Shipper, MD;  Location: [facility] ENDOSCOPY;  Service: Endoscopy;  Laterality: N/A;  . Gastrectomy     Family History  Problem Relation Age of Onset  . Anesthesia problems Neg Hx   . Hypotension Neg Hx   . Malignant hyperthermia Neg Hx   . Pseudochol deficiency Neg Hx   . Diabetes Mother   . Stroke Mother   . Aneurysm Father     died of brain aneurysm   Social History  Substance Use Topics  . Smoking status: Never Smoker   . Smokeless tobacco: Never Used  . Alcohol Use: No    Review of Systems  Ten systems are reviewed and are negative for acute change except as noted in the HPI  Allergies  Review of patient's allergies  indicates no known allergies.  Home Medications   Prior to Admission medications   Medication Sig Start Date End Date Taking? Authorizing Provider  Armodafinil (NUVIGIL PO) Take by mouth. Pt has ran out of samples; waiting for approval for Rx    Historical Provider, MD  desvenlafaxine (PRISTIQ) 50 MG 24 hr tablet Take 50 mg by mouth daily.    Historical Provider, MD  dicyclomine (BENTYL) 20 MG tablet Take 1 tab twice daily. 11/09/12   Jessica D Zehr, PA-C  hydroxypropyl methylcellulose (ISOPTO TEARS) 2.5 % ophthalmic solution Place 1 drop into both eyes 4 (four) times daily as needed. For dry eyes    Historical Provider, MD  Multiple Vitamin (MULITIVITAMIN WITH MINERALS) TABS Take 1 tablet by mouth daily.    Historical Provider, MD  omeprazole (PRILOSEC) 40 MG capsule Take 1 capsule (40 mg total) by mouth daily. 01/12/15   Irene Shipper, MD  traZODone (DESYREL) 50 MG tablet Take 50 mg by mouth at bedtime.    Historical Provider, MD   BP 145/87 mmHg  Pulse 80  Temp(Src) 98.2 F (36.8 C) (Oral)  Resp 18  SpO2 98% Physical Exam  Constitutional: He appears well-developed and well-nourished. No distress.  HENT:  Head: Normocephalic and atraumatic.  Mouth/Throat: Oropharynx is clear and moist. No oropharyngeal exudate.  Eyes: Conjunctivae are normal. Pupils  are equal, round, and reactive to light. Right eye exhibits no discharge. Left eye exhibits no discharge. No scleral icterus.  Neck: No tracheal deviation present.  Cardiovascular: Normal rate, regular rhythm, normal heart sounds and intact distal pulses.  Exam reveals no gallop and no friction rub.   No murmur heard. Pulmonary/Chest: Effort normal and breath sounds normal. No respiratory distress. He has no wheezes. He has no rales. He exhibits no tenderness.  Abdominal: Soft. Bowel sounds are normal. He exhibits no distension and no mass. There is tenderness. There is no rebound and no guarding.  Epigastric, RUQ, suprapubic tenderness.  There is a vertical scar from the epigastrium to the umbilicus from previous surgery due to gastric tumor  Musculoskeletal: Normal range of motion. He exhibits no edema.  Lymphadenopathy:    He has no cervical adenopathy.  Neurological: He is alert. He exhibits normal muscle tone. Coordination normal.  Skin: Skin is warm and dry. No rash noted. He is not diaphoretic. No erythema.  Psychiatric:  Confusing historian but mood is appropriate.   Nursing note and vitals reviewed.   ED Course  Procedures  Labs Review Labs Reviewed  COMPREHENSIVE METABOLIC PANEL - Abnormal; Notable for the following:    Calcium 8.4 (*)    Albumin 3.1 (*)    ALT 13 (*)    Anion gap 4 (*)    All other components within normal limits  LIPASE, BLOOD  CBC  URINALYSIS, ROUTINE W REFLEX MICROSCOPIC (NOT AT [facility])    Imaging Review No results found. I have personally reviewed and evaluated these images and lab results as part of my medical decision-making.  MDM   Final diagnoses:  Generalized abdominal pain   Patient non-toxic appearing. VSS. Will get basic labs and CT abdomen pelvis.  Hand off to Florene Glen, NP.   Marathon Lions, PA-C 01/17/15 2024  Wandra Arthurs, MD 01/17/15 [phone]

## 2015-01-17 NOTE — ED Provider Notes (Addendum)
Patient has returned from CT scan, radiologist called with the finding of tip appendicitis.  On reexamination patient is mildly tender midline.  He is not tachycardic or febrile. We'll contact general surgery for further evaluation  Junius Creamer, NP 01/17/15 2322 I spoke with Dr. Hulen Skains from [facility] surgery.  Patient is to be admitted to their service.  He requests that additional antibiotic, be given Flagyl has been added Keppra.  Admission orders have been written.  Patient will be receiving D5 half-normal saline at 125 cc an hour.  He'll be given every 12 hours Rocephin 1 g, as well as Flagyl 5 mg 3 times a day surgery will see the patient in the morning.  Junius Creamer, NP 01/17/15 2336  Wandra Arthurs, MD 01/17/15 2346 Dr. Hulen Skains reviewed CT scan and feels that this may or may not be acute appendicitis, but agrees the patient does need to have antibiotics.  I will attempt to admit patient to medicine for continued IV antibiotic therapy and reevaluation  Junius Creamer, NP 01/18/15 2255  Wandra Arthurs, MD 01/18/15 2320

## 2015-01-17 NOTE — ED Notes (Signed)
Pt sent here from ucc due to abd pain/cramping and constipation.

## 2015-01-17 NOTE — ED Provider Notes (Signed)
CSN: [id number]     Arrival date & time 01/17/15  1638 History   First MD Initiated Contact with Patient 01/17/15 1709     Chief Complaint  Patient presents with  . Abdominal Pain   (Consider location/radiation/quality/duration/timing/severity/associated sxs/prior Treatment) HPI Comments: 59 year old male presenting with moderate to severe intermittent abdominal pain progressing over the past week or so. He describes it as severe cramps. He states that he had been constipated and has been straining this week. He took milk of magnesia yesterday and the cramps became worse. He did have some stool after that.  His history of present illness is somewhat confusing and contradictory. Although he has a history of IBS he states this is not typical of his IBS symptoms and is much more severe. He also has a history of GERD,  gastric bleeding secondary to gastric tumor and status post partial gastrectomy.    Past Medical History  Diagnosis Date  . Anemia 2009    noted during hospitalization for surgical I&D of chest, arm abscess  . Depression   . 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 GI bleed 2013  . Hypertension   . GERD (gastroesophageal reflux disease)    Past Surgical History  Procedure Laterality Date  . Incise and drain abcess  2009    patient had abscesses and cellulitis of the chest and arm which grew out microfollicular strap.  . Esophagogastroduodenoscopy  05/05/2011    Procedure: ESOPHAGOGASTRODUODENOSCOPY (EGD);  Surgeon: Irene Shipper, MD;  Location: [facility] ENDOSCOPY;  Service: Endoscopy;  Laterality: N/A;  . Gastrectomy     Family History  Problem Relation Age of Onset  . Anesthesia problems Neg Hx   . Hypotension Neg Hx   . Malignant hyperthermia Neg Hx   . Pseudochol deficiency Neg Hx   . Diabetes Mother   . Stroke Mother   . Aneurysm Father     died of brain aneurysm   Social History  Substance Use Topics  . Smoking status: Never Smoker   . Smokeless tobacco: Never Used  .  Alcohol Use: No    Review of Systems  Constitutional: Positive for activity change and appetite change. Negative for fever and fatigue.  HENT: Negative.   Respiratory: Negative.  Negative for cough and shortness of breath.   Cardiovascular: Negative for chest pain.  Gastrointestinal: Positive for nausea, abdominal pain, diarrhea and constipation. Negative for vomiting and blood in stool.  Genitourinary: Negative.   Skin: Negative.     Allergies  Review of patient's allergies indicates no known allergies.  Home Medications   Prior to Admission medications   Medication Sig Start Date End Date Taking? Authorizing Provider  Armodafinil (NUVIGIL PO) Take by mouth. Pt has ran out of samples; waiting for approval for Rx   Yes Historical Provider, MD  desvenlafaxine (PRISTIQ) 50 MG 24 hr tablet Take 50 mg by mouth daily.   Yes Historical Provider, MD  omeprazole (PRILOSEC) 40 MG capsule Take 1 capsule (40 mg total) by mouth daily. 01/12/15  Yes Irene Shipper, MD  dicyclomine (BENTYL) 20 MG tablet Take 1 tab twice daily. 11/09/12   Jessica D Zehr, PA-C  hydroxypropyl methylcellulose (ISOPTO TEARS) 2.5 % ophthalmic solution Place 1 drop into both eyes 4 (four) times daily as needed. For dry eyes    Historical Provider, MD  Multiple Vitamin (MULITIVITAMIN WITH MINERALS) TABS Take 1 tablet by mouth daily.    Historical Provider, MD  traZODone (DESYREL) 50 MG tablet Take  50 mg by mouth at bedtime.    Historical Provider, MD   Meds Ordered and Administered this Visit  Medications - No data to display  BP 139/82 mmHg  Pulse 84  Temp(Src) 98.3 F (36.8 C) (Oral)  Resp 16  SpO2 98% No data found.   Physical Exam  Constitutional: He appears well-developed and well-nourished. No distress.  Neck: Normal range of motion. Neck supple.  Cardiovascular: Normal rate, regular rhythm and normal heart sounds.   Pulmonary/Chest: Effort normal and breath sounds normal. No respiratory distress.   Abdominal: Soft. Bowel sounds are normal.  There are various areas of tenderness in the abdomen. The greatest amount of tenderness is in the left mid abdomen. He grimaces with abdominal palpation. He states it causes mild discomfort to some areas and severe pain in others. Percussion is tympanic to hyperresonant across the upper abdomen primarily. Percussion also induces abdominal pain. There is a vertical scar from the epigastrium to the umbilicus from previous surgery due to gastric tumor.  Neurological: He is alert. He exhibits normal muscle tone.  Skin: Skin is warm and dry.  Nursing note and vitals reviewed.   ED Course  Procedures (including critical care time)  Labs Review Labs Reviewed - No data to display  Imaging Review No results found.   Visual Acuity Review  Right Eye Distance:   Left Eye Distance:   Bilateral Distance:    Right Eye Near:   Left Eye Near:    Bilateral Near:         MDM   1. Generalized abdominal pain   2. History of IBS   3. History of GI bleed   4. Generalized abdominal tenderness, rebound tenderness presence not specified    59 year old male with a history of IBS, GERD, gastric tumor with bleeding and subsequent partial gastrectomy. He also has an unknown type mental health disorder for which she had been taking Pristiq and Nuvigil. Recently, one or more of these medications have  been changed. It is likely that he is having abdominal cramping secondary to IBS and made worse with the milk of magnesia. Since the patient seems to be having severe abdominal tenderness as well as severe disabling pain intermittently will transfer him to the ED for additional evaluation. He is currently stable and relatively comfortable at this time.   Janne Napoleon, NP 01/17/15 1743

## 2015-01-17 NOTE — ED Notes (Signed)
Pt reports increased stress and a medication change that he thinks is causing his symptoms.

## 2015-01-17 NOTE — ED Notes (Signed)
Report called to Lanny Hurst, ED First Nurse.

## 2015-01-17 NOTE — ED Notes (Signed)
C/O generalized abd cramping x 1 wk.  Reports recent med changes: stopped Cymbalta 2 wks ago, started Pristiq and Nuvigil 1 month ago, but ran out of Nuvigil samples while waiting for approval for Rx.  Pt had also been leaking urine, and constipated x2 days; took MOM - had to strain yesterday, but had a BM.  Now c/o diarrhea and slight nausea.

## 2015-01-18 DIAGNOSIS — R1033 Periumbilical pain: Secondary | ICD-10-CM | POA: Diagnosis not present

## 2015-01-18 DIAGNOSIS — K219 Gastro-esophageal reflux disease without esophagitis: Secondary | ICD-10-CM | POA: Diagnosis not present

## 2015-01-18 DIAGNOSIS — I10 Essential (primary) hypertension: Secondary | ICD-10-CM | POA: Diagnosis not present

## 2015-01-18 DIAGNOSIS — N390 Urinary tract infection, site not specified: Secondary | ICD-10-CM | POA: Insufficient documentation

## 2015-01-18 DIAGNOSIS — F329 Major depressive disorder, single episode, unspecified: Secondary | ICD-10-CM | POA: Diagnosis present

## 2015-01-18 DIAGNOSIS — F32A Depression, unspecified: Secondary | ICD-10-CM | POA: Diagnosis present

## 2015-01-18 DIAGNOSIS — K37 Unspecified appendicitis: Secondary | ICD-10-CM | POA: Diagnosis present

## 2015-01-18 LAB — BASIC METABOLIC PANEL
ANION GAP: 3 — AB (ref 5–15)
BUN: 11 mg/dL (ref 6–20)
CALCIUM: 7.7 mg/dL — AB (ref 8.9–10.3)
CO2: 27 mmol/L (ref 22–32)
CREATININE: 0.89 mg/dL (ref 0.61–1.24)
Chloride: 108 mmol/L (ref 101–111)
GFR calc Af Amer: >60 mL/min (ref >60)
GLUCOSE: 92 mg/dL (ref 65–99)
Potassium: 3.6 mmol/L (ref 3.5–5.1)
Sodium: 138 mmol/L (ref 135–145)

## 2015-01-18 LAB — TYPE AND SCREEN
ABO/RH(D): A POS
ANTIBODY SCREEN: NEGATIVE

## 2015-01-18 LAB — PROTIME-INR
INR: 1.08 (ref 0.00–1.49)
PROTHROMBIN TIME: 14.2 s (ref 11.6–15.2)

## 2015-01-18 LAB — CBC
HCT: 38.6 % — ABNORMAL LOW (ref 39.0–52.0)
Hemoglobin: 12.5 g/dL — ABNORMAL LOW (ref 13.0–17.0)
MCH: 29.6 pg (ref 26.0–34.0)
MCHC: 32.4 g/dL (ref 30.0–36.0)
MCV: 91.3 fL (ref 78.0–100.0)
PLATELETS: 177 10*3/uL (ref 150–400)
RBC: 4.23 MIL/uL (ref 4.22–5.81)
RDW: 14.2 % (ref 11.5–15.5)
WBC: 6.4 10*3/uL (ref 4.0–10.5)

## 2015-01-18 LAB — APTT: aPTT: 39 seconds — ABNORMAL HIGH (ref 24–37)

## 2015-01-18 LAB — GLUCOSE, CAPILLARY: GLUCOSE-CAPILLARY: 80 mg/dL (ref 65–99)

## 2015-01-18 LAB — HIV ANTIBODY (ROUTINE TESTING W REFLEX): HIV Screen 4th Generation wRfx: NONREACTIVE

## 2015-01-18 MED ORDER — HYPROMELLOSE (GONIOSCOPIC) 2.5 % OP SOLN: 1.0000 [drp] | Freq: Four times a day (QID) | OPHTHALMIC | Status: DC | PRN

## 2015-01-18 MED ORDER — POLYETHYLENE GLYCOL 3350 17 G PO PACK
17.0000 g | PACK | Freq: Two times a day (BID) | ORAL | Status: DC
  Administered 2015-01-18 – 2015-01-20 (×4): 17 g via ORAL
  Filled 2015-01-18 (×5): qty 1

## 2015-01-18 MED ORDER — MODAFINIL 100 MG PO TABS
50.0000 mg | ORAL_TABLET | Freq: Every day | ORAL | Status: DC
  Administered 2015-01-18 – 2015-01-20 (×3): 50 mg via ORAL
  Filled 2015-01-18 (×3): qty 1

## 2015-01-18 MED ORDER — SODIUM CHLORIDE 0.9 % IJ SOLN
3.0000 mL | Freq: Two times a day (BID) | INTRAMUSCULAR | Status: DC
  Administered 2015-01-18 – 2015-01-20 (×6): 3 mL via INTRAVENOUS

## 2015-01-18 MED ORDER — ONDANSETRON HCL 4 MG/2ML IJ SOLN: 4.0000 mg | Freq: Three times a day (TID) | INTRAMUSCULAR | Status: AC | PRN

## 2015-01-18 MED ORDER — HYDROMORPHONE HCL 1 MG/ML IJ SOLN: 1.0000 mg | INTRAMUSCULAR | Status: AC | PRN

## 2015-01-18 MED ORDER — PANTOPRAZOLE SODIUM 40 MG PO TBEC
40.0000 mg | DELAYED_RELEASE_TABLET | Freq: Every day | ORAL | Status: DC
  Administered 2015-01-18 – 2015-01-20 (×3): 40 mg via ORAL
  Filled 2015-01-18 (×3): qty 1

## 2015-01-18 MED ORDER — SODIUM CHLORIDE 0.9 % IV BOLUS (SEPSIS)
500.0000 mL | Freq: Once | INTRAVENOUS | Status: AC
  Administered 2015-01-18: 500 mL via INTRAVENOUS

## 2015-01-18 MED ORDER — ENOXAPARIN SODIUM 40 MG/0.4ML ~~LOC~~ SOLN
40.0000 mg | SUBCUTANEOUS | Status: DC
  Administered 2015-01-18 – 2015-01-19 (×2): 40 mg via SUBCUTANEOUS
  Filled 2015-01-18 (×2): qty 0.4

## 2015-01-18 MED ORDER — TRAZODONE HCL 50 MG PO TABS: 50.0000 mg | ORAL_TABLET | Freq: Every evening | ORAL | Status: DC | PRN

## 2015-01-18 MED ORDER — ACETAMINOPHEN 325 MG PO TABS
650.0000 mg | ORAL_TABLET | Freq: Four times a day (QID) | ORAL | Status: DC | PRN
  Administered 2015-01-19: 650 mg via ORAL
  Filled 2015-01-18: qty 2

## 2015-01-18 MED ORDER — INFLUENZA VAC SPLIT QUAD 0.5 ML IM SUSY
0.5000 mL | PREFILLED_SYRINGE | INTRAMUSCULAR | Status: AC
  Administered 2015-01-19: 0.5 mL via INTRAMUSCULAR
  Filled 2015-01-18 (×2): qty 0.5

## 2015-01-18 MED ORDER — DESVENLAFAXINE SUCCINATE ER 50 MG PO TB24
50.0000 mg | ORAL_TABLET | Freq: Every day | ORAL | Status: DC
  Administered 2015-01-18 – 2015-01-20 (×3): 50 mg via ORAL
  Filled 2015-01-18 (×3): qty 1

## 2015-01-18 MED ORDER — DEXTROSE-NACL 5-0.45 % IV SOLN
INTRAVENOUS | Status: DC
  Administered 2015-01-18 – 2015-01-19 (×3): via INTRAVENOUS

## 2015-01-18 MED ORDER — ADULT MULTIVITAMIN W/MINERALS CH
1.0000 | ORAL_TABLET | Freq: Every day | ORAL | Status: DC
  Administered 2015-01-18 – 2015-01-20 (×3): 1 via ORAL
  Filled 2015-01-18 (×3): qty 1

## 2015-01-18 MED ORDER — METRONIDAZOLE IN NACL 5-0.79 MG/ML-% IV SOLN
500.0000 mg | Freq: Three times a day (TID) | INTRAVENOUS | Status: DC
  Administered 2015-01-18 (×2): 500 mg via INTRAVENOUS
  Filled 2015-01-18 (×2): qty 100

## 2015-01-18 MED ORDER — CEFTRIAXONE SODIUM 1 G IJ SOLR
1.0000 g | INTRAMUSCULAR | Status: DC
  Administered 2015-01-18 – 2015-01-20 (×3): 1 g via INTRAVENOUS
  Filled 2015-01-18 (×3): qty 10

## 2015-01-18 NOTE — ED Notes (Signed)
Attempted to call report

## 2015-01-18 NOTE — Progress Notes (Signed)
Utilization review completed.  

## 2015-01-18 NOTE — H&P (Addendum)
[facility] History and Physical  John Keller [id number] DOB: Oct 25, 1955 DOA: 01/17/2015  Referring physician: ED physician PCP: Crisoforo Oxford, PA-C  Specialists:   Chief Complaint: Abdominal pain and burning on urination  HPI: John Keller is a 59 y.o. male with PMH of hypertension, GERD, depression, gastrointestinal stromal tumor (S/P resection), GI bleeding, who presents with abdominal pain and burning on urination  Patient reports that he has been having abdominal pain for about one week. His pain is located in the periumbilical area, intermittent, moderate, nonradiating. It is associated with nausea, but not vomiting. He was constipated x 2 days and had one loose stool bowel movement today after taking "MOM". Patient has chills, but no fever. He also reports having burning on urination and increased urinary frequency, but no dysuria. No penile discharge. Patient does not have chest pain, shortness of breath, cough, unilateral weakness.  He reports recent med changes for his depression. He stopped Cymbalta 2 wks ago, started Pristiq and Nuvigil 1 month ago, but ran out of Nuvigil samples while waiting for approval for Rx.Patient does not have suicidal or homicidal ideations.  In ED, patient was found to have WBC 7.5, temperature normal, no tachycardia, positive urinalysis with a small amount of leukocyte, lipase is 36, electrolytes and her renal function okay, low AG at 4.0. CT abdomen/pelvis that showed dilatation and wall thickening in the distal appendix with mild periappendiceal fat stranding, cannot exclude a tip appendicitis. No evidence of perforation or abscess. Nonspecific diffuse bladder wall thickening with mild prostatomegaly, which could indicate chronic bladder outlet obstruction, although an acute cystitis cannot be excluded and correlation with urinalysis is advised. Left lower lobe 4 mm pulmonary nodule (If the patient is at high risk for bronchogenic  carcinoma, follow-up chest CT at 1 year is recommended. If the patient is at low risk, no follow-up is needed). Small umbilical and bilateral inguinal hernias.   Where does patient live?   At home  Can patient participate in ADLs?  Yes     Review of Systems:   General: no fevers, chills, no changes in body weight, has poor appetite, has fatigue HEENT: no blurry vision, hearing changes or sore throat Pulm: no dyspnea, coughing, wheezing CV: no chest pain, palpitations Abd: has nausea, abdominal pain, No vomiting, diarrhea, constipation GU: no dysuria, has burning on urination, increased urinary frequency, no hematuria  Ext: no leg edema Neuro: no unilateral weakness, numbness, or tingling, no vision change or hearing loss Skin: no rash MSK: No muscle spasm, no deformity, no limitation of range of movement in spin Heme: No easy bruising.  Travel history: No recent long distant travel.  Allergy: No Known Allergies  Past Medical History  Diagnosis Date  . Anemia 2009    noted during hospitalization for surgical I&D of chest, arm abscess  . Depression   . 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 GI bleed 2013  . Hypertension   . GERD (gastroesophageal reflux disease)     Past Surgical History  Procedure Laterality Date  . Incise and drain abcess  2009    patient had abscesses and cellulitis of the chest and arm which grew out microfollicular strap.  . Esophagogastroduodenoscopy  05/05/2011    Procedure: ESOPHAGOGASTRODUODENOSCOPY (EGD);  Surgeon: Irene Shipper, MD;  Location: [facility] ENDOSCOPY;  Service: Endoscopy;  Laterality: N/A;  . Gastrectomy      Social History:  reports that he has never smoked. He has never used smokeless tobacco.  He reports that he does not drink alcohol or use illicit drugs.  Family History:  Family History  Problem Relation Age of Onset  . Anesthesia problems Neg Hx   . Hypotension Neg Hx   . Malignant hyperthermia Neg Hx   . Pseudochol deficiency Neg Hx    . Diabetes Mother   . Stroke Mother   . Aneurysm Father     died of brain aneurysm     Prior to Admission medications   Medication Sig Start Date End Date Taking? Authorizing Provider  acetaminophen (TYLENOL) 500 MG tablet Take 1,000 mg by mouth every 6 (six) hours as needed (pain).   Yes Historical Provider, MD  Armodafinil (NUVIGIL PO) Take by mouth. Pt has ran out of samples; waiting for approval for Rx   Yes Historical Provider, MD  desvenlafaxine (PRISTIQ) 50 MG 24 hr tablet Take 50 mg by mouth daily.   Yes Historical Provider, MD  dicyclomine (BENTYL) 20 MG tablet Take 1 tab twice daily. 11/09/12  Yes Jessica D Zehr, PA-C  hydroxypropyl methylcellulose (ISOPTO TEARS) 2.5 % ophthalmic solution Place 1 drop into both eyes 4 (four) times daily as needed. For dry eyes   Yes Historical Provider, MD  Multiple Vitamin (MULITIVITAMIN WITH MINERALS) TABS Take 1 tablet by mouth daily.   Yes Historical Provider, MD  traZODone (DESYREL) 50 MG tablet Take 50 mg by mouth at bedtime as needed for sleep.    Yes Historical Provider, MD  omeprazole (PRILOSEC) 40 MG capsule Take 1 capsule (40 mg total) by mouth daily. Patient not taking: Reported on 01/17/2015 01/12/15   Irene Shipper, MD  ondansetron (ZOFRAN) 4 MG tablet Take 1 tablet (4 mg total) by mouth every 6 (six) hours. 01/17/15   Hebron Lions, PA-C    Physical Exam: Filed Vitals:   01/18/15 0100 01/18/15 0102 01/18/15 0146 01/18/15 0600  BP: 155/93 155/93 155/78 121/74  Pulse: 59 58 63 82  Temp:   97.9 F (36.6 C) 98 F (36.7 C)  TempSrc:   Oral Oral  Resp:  16 19 19   Weight:   104.01 kg (229 lb 4.8 oz)   SpO2: 98% 99% 99% 100%   General: Not in acute distress HEENT:       Eyes: PERRL, EOMI, no scleral icterus.       ENT: No discharge from the ears and nose, no pharynx injection, no tonsillar enlargement.        Neck: No JVD, no bruit, no mass felt. Heme: No neck lymph node enlargement. Cardiac: S1/S2, RRR, No murmurs, No  gallops or rubs. Pulm: No rales, wheezing, rhonchi or rubs. Abd: Soft, nondistended, tenderness over the periumbilical area, no rebound pain, no organomegaly, BS present. Ext: No pitting leg edema bilaterally. 2+DP/PT pulse bilaterally. Musculoskeletal: No joint deformities, No joint redness or warmth, no limitation of ROM in spin. Skin: No rashes.  Neuro: Alert, oriented X3, cranial nerves II-XII grossly intact, muscle strength 5/5 in all extremities, sensation to light touch intact.  Psych: Patient is in depressed mood, but no suicidal or hemocidal ideation.  Labs on Admission:  Basic Metabolic Panel:  Recent Labs Lab 01/17/15 1825 01/18/15 0109  NA 138 138  K 4.1 3.6  CL 106 108  CO2 28 27  GLUCOSE 91 92  BUN 13 11  CREATININE 0.95 0.89  CALCIUM 8.4* 7.7*   Liver Function Tests:  Recent Labs Lab 01/17/15 1825  AST 20  ALT 13*  ALKPHOS 81  BILITOT 0.4  PROT 6.5  ALBUMIN 3.1*    Recent Labs Lab 01/17/15 1825  LIPASE 36   No results for input(s): AMMONIA in the last 168 hours. CBC:  Recent Labs Lab 01/17/15 1825 01/18/15 0109  WBC 7.5 6.4  HGB 13.3 12.5*  HCT 40.5 38.6*  MCV 92.0 91.3  PLT 205 177   Cardiac Enzymes: No results for input(s): CKTOTAL, CKMB, CKMBINDEX, TROPONINI in the last 168 hours.  BNP (last 3 results) No results for input(s): BNP in the last 8760 hours.  ProBNP (last 3 results) No results for input(s): PROBNP in the last 8760 hours.  CBG: No results for input(s): GLUCAP in the last 168 hours.  Radiological Exams on Admission: Ct Abdomen Pelvis W Contrast  01/17/2015  CLINICAL DATA:  Generalized abdominal pain. History of colonic surgery for ruptured diverticulosis. Gastrectomy for gastric tumor. EXAM: CT ABDOMEN AND PELVIS WITH CONTRAST TECHNIQUE: Multidetector CT imaging of the abdomen and pelvis was performed using the standard protocol following bolus administration of intravenous contrast. CONTRAST:  120mL OMNIPAQUE IOHEXOL  300 MG/ML  SOLN COMPARISON:  11/13/2012 CT abdomen/ pelvis. FINDINGS: Lower chest: Subpleural left lower lobe 4 mm nodular focus (series 4/image 11), not definitely seen on prior CT. Hepatobiliary: Normal liver with no liver mass. Normal gallbladder with no radiopaque cholelithiasis. No biliary ductal dilatation. Pancreas: Normal, with no mass or duct dilation. Spleen: Normal size. No mass. Adrenals/Urinary Tract: Normal adrenals. Hypodense 0.7 cm lateral upper right renal lesion, too small to characterize, not appreciably changed, likely a benign renal cyst. Simple 6.4 cm renal cyst in the lower left kidney. Several additional subcentimeter hypodense lesions in both kidneys are too small to characterize. Nonspecific diffuse bladder wall thickening. Stomach/Bowel: There are stable postsurgical changes status post partial gastrectomy in the region of the gastric fundus, with otherwise grossly normal collapsed stomach. Normal caliber small bowel with no small bowel wall thickening. There is mild dilatation and wall thickening in the distal appendix with mild periappendiceal fat stranding, and a tip appendicitis cannot be excluded. Normal large bowel with no diverticulosis, large bowel wall thickening or pericolonic fat stranding. Vascular/Lymphatic: Normal caliber abdominal aorta. Patent portal, splenic and renal veins. No pathologically enlarged lymph nodes in the abdomen or pelvis. Reproductive: Mild prostatomegaly. Other: No pneumoperitoneum, ascites or focal fluid collection. Musculoskeletal: No aggressive appearing focal osseous lesions. Moderate degenerative changes in the visualized thoracolumbar spine. Stable small fat containing umbilical hernia. Stable small fat containing bilateral inguinal hernias, right greater than left. IMPRESSION: 1. Dilatation and wall thickening in the distal appendix with mild periappendiceal fat stranding, cannot exclude a tip appendicitis. No evidence of perforation or abscess. 2.  Nonspecific diffuse bladder wall thickening with mild prostatomegaly, which could indicate chronic bladder outlet obstruction, although an acute cystitis cannot be excluded and correlation with urinalysis is advised. 3. Left lower lobe 4 mm pulmonary nodule. If the patient is at high risk for bronchogenic carcinoma, follow-up chest CT at 1 year is recommended. If the patient is at low risk, no follow-up is needed. This recommendation follows the consensus statement: Guidelines for Management of Small Pulmonary Nodules Detected on CT Scans: A Statement from the [place] as published in Radiology 2005; 123456. 4. Small umbilical and bilateral inguinal hernias. These results were called by telephone at the time of interpretation on 01/17/2015 at 11:13 pm to Dr. Darl Householder, who verbally acknowledged these results. Electronically Signed   By: Ilona Sorrel M.D.   On: 01/17/2015 23:15    EKG: Not done in  ED, will get one.   Assessment/Plan Principal Problem:   Abdominal pain Active Problems:   GIST (gastrointestinal stromal tumor), non-malignant   Urinary tract infection   Depression   Hypertension   GERD (gastroesophageal reflux disease)   Appendicitis  Abdominal pain: CT abdomen/pelvis showed possible appendicitis and cystitis which may be the etiology. Patient is not septic on admission. Hemodynamically stable. General surgery, Dr. Hulen Skains was consulted, will see morning.  -Will admit to med-surg bed -ED started IV Rocephin and Flagyl, we'll continue -f/u blood culture and urine culture -IVF: NS 500 cc x 2 and then 5-1/2 NS at 125 cc/h -NPO -INR/PTT/type & screen -When necessary Zofran for nausea, Dilaudid for pain -Follow-up surgeon's recommendations  UTI: Patient has burning urination and increased urinary frequency plus positive urinalysis with small amount of leukocytes, consistent with UTI. - On Rocephin and Flagyl as above - Follow up results of urine and blood cx and amend  antibiotic regimen if needed per sensitivity results - check GC Chlamydia prob - Follow up with PCP for BPH as shown by CT scan  Depression: Patient is in depressed mood, but no suicidal or homicidal ideations. Patient ran out of his Nuvigil x 1 month. - continue Pristiq - resume his Nuvigil  GERD: -Protonix  HTN: not on medications. Bp=142/94 - observe bp closely. If elevated significantly, will start medications  Low AG: AG=4. Likely due to hypo-abluminemia ( albumin=3.1). AG is corrected to= (4 - 3.1) x 2.5 + 4 = 6.25, which is normal. Pt's protein gap is normal, less likely to have MM. -follow up by BMP  DVT ppx: SCD  Code Status: Full code Family Communication: None at bed side.   Disposition Plan: Admit to inpatient   Date of Service 01/18/2015    Ivor Costa [facility] Pager [phone]  If 7PM-7AM, please contact night-coverage [email or web address] Password [facility] 01/18/2015, 6:26 AM

## 2015-01-18 NOTE — Consult Note (Signed)
Reason for Consult:constipation, ct finding Referring Physician: Dr. Vertis Kelch is an 59 y.o. male.  HPI: John Keller with history of gist s/p resection presents with two weeks of abdominal cramping, constipation and burning with urination. Some nausea. Not eating as much.  Has had a loose bm with milk of mag.  No fevers.  Increased frequency.  He recently changed some meds and had attributed much of this to that.  He was sent to er from urgent care where he underwent ct scan that showed dilation and wall thickening in distal appendix cannot exclude a tip appendicitis and that is reason for consult.   He is not complaining of any abdominal pain.   Past Medical History  Diagnosis Date  . Anemia 2009    noted during hospitalization for surgical I&D of chest, arm abscess  . Depression   . 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 GI bleed 2013  . Hypertension   . GERD (gastroesophageal reflux disease)     Past Surgical History  Procedure Laterality Date  . Incise and drain abcess  2009    patient had abscesses and cellulitis of the chest and arm which grew out microfollicular strap.  . Esophagogastroduodenoscopy  05/05/2011    Procedure: ESOPHAGOGASTRODUODENOSCOPY (EGD);  Surgeon: Irene Shipper, MD;  Location: [facility] ENDOSCOPY;  Service: Endoscopy;  Laterality: N/A;  . Gastrectomy      Family History  Problem Relation Age of Onset  . Anesthesia problems Neg Hx   . Hypotension Neg Hx   . Malignant hyperthermia Neg Hx   . Pseudochol deficiency Neg Hx   . Diabetes Mother   . Stroke Mother   . Aneurysm Father     died of brain aneurysm    Social History:  reports that he has never smoked. He has never used smokeless tobacco. He reports that he does not drink alcohol or use illicit drugs.  Allergies: No Known Allergies  Medications: I have reviewed the patient's current medications.  Results for orders placed or performed during the hospital encounter of 01/17/15 (from the past 48  hour(s))  Lipase, blood     Status: None   Collection Time: 01/17/15  6:25 PM  Result Value Ref Range   Lipase 36 11 - 51 U/L  Comprehensive metabolic panel     Status: Abnormal   Collection Time: 01/17/15  6:25 PM  Result Value Ref Range   Sodium 138 135 - 145 mmol/L   Potassium 4.1 3.5 - 5.1 mmol/L   Chloride 106 101 - 111 mmol/L   CO2 28 22 - 32 mmol/L   Glucose, Bld 91 65 - 99 mg/dL   BUN 13 6 - 20 mg/dL   Creatinine, Ser 0.95 0.John - 1.24 mg/dL   Calcium 8.4 (L) 8.9 - 10.3 mg/dL   Total Protein 6.5 6.5 - 8.1 g/dL   Albumin 3.1 (L) 3.5 - 5.0 g/dL   AST 20 15 - 41 U/L   ALT 13 (L) 17 - 63 U/L   Alkaline Phosphatase 81 38 - 126 U/L   Total Bilirubin 0.4 0.3 - 1.2 mg/dL   GFR calc non Af Amer >60 >60 mL/min   GFR calc Af Amer >60 >60 mL/min    Comment: (NOTE) The eGFR has been calculated using the CKD EPI equation. This calculation has not been validated in all clinical situations. eGFR's persistently <60 mL/min signify possible Chronic Kidney Disease.    Anion gap 4 (L) 5 -  15  CBC     Status: None   Collection Time: 01/17/15  6:25 PM  Result Value Ref Range   WBC 7.5 4.0 - 10.5 K/uL   RBC 4.40 4.22 - 5.81 MIL/uL   Hemoglobin 13.3 13.0 - 17.0 g/dL   HCT 40.5 39.0 - 52.0 %   MCV 92.0 78.0 - 100.0 fL   MCH 30.2 26.0 - 34.0 pg   MCHC 32.8 30.0 - 36.0 g/dL   RDW 14.1 11.5 - 15.5 %   Platelets 205 150 - 400 K/uL  Urinalysis, Routine w reflex microscopic (not at [facility])     Status: Abnormal   Collection Time: 01/17/15  7:53 PM  Result Value Ref Range   Color, Urine YELLOW YELLOW   APPearance CLOUDY (A) CLEAR   Specific Gravity, Urine 1.023 1.005 - 1.030   pH 6.0 5.0 - 8.0   Glucose, UA NEGATIVE NEGATIVE mg/dL   Hgb urine dipstick NEGATIVE NEGATIVE   Bilirubin Urine NEGATIVE NEGATIVE   Ketones, ur NEGATIVE NEGATIVE mg/dL   Protein, ur NEGATIVE NEGATIVE mg/dL   Nitrite NEGATIVE NEGATIVE   Leukocytes, UA SMALL (A) NEGATIVE  Urine microscopic-add on     Status: Abnormal     Collection Time: 01/17/15  7:53 PM  Result Value Ref Range   Squamous Epithelial / LPF 0-5 (A) NONE SEEN    Comment: Please note change in reference range.   WBC, UA TOO NUMEROUS TO COUNT 0 - 5 WBC/hpf    Comment: Please note change in reference range.   RBC / HPF 0-5 0 - 5 RBC/hpf    Comment: Please note change in reference range.   Bacteria, UA FEW (A) NONE SEEN    Comment: Please note change in reference range.   Urine-Other MUCOUS PRESENT   Protime-INR     Status: None   Collection Time: 01/18/15  1:09 AM  Result Value Ref Range   Prothrombin Time 14.2 11.6 - 15.2 seconds   INR 1.08 0.00 - 1.49  APTT     Status: Abnormal   Collection Time: 01/18/15  1:09 AM  Result Value Ref Range   aPTT 39 (H) 24 - 37 seconds    Comment:        IF BASELINE aPTT IS ELEVATED, SUGGEST PATIENT RISK ASSESSMENT BE USED TO DETERMINE APPROPRIATE ANTICOAGULANT THERAPY.   Basic metabolic panel     Status: Abnormal   Collection Time: 01/18/15  1:09 AM  Result Value Ref Range   Sodium 138 135 - 145 mmol/L   Potassium 3.6 3.5 - 5.1 mmol/L   Chloride 108 101 - 111 mmol/L   CO2 27 22 - 32 mmol/L   Glucose, Bld 92 65 - 99 mg/dL   BUN 11 6 - 20 mg/dL   Creatinine, Ser 0.89 0.John - 1.24 mg/dL   Calcium 7.7 (L) 8.9 - 10.3 mg/dL   GFR calc non Af Amer >60 >60 mL/min   GFR calc Af Amer >60 >60 mL/min    Comment: (NOTE) The eGFR has been calculated using the CKD EPI equation. This calculation has not been validated in all clinical situations. eGFR's persistently <60 mL/min signify possible Chronic Kidney Disease.    Anion gap 3 (L) 5 - 15  CBC     Status: Abnormal   Collection Time: 01/18/15  1:09 AM  Result Value Ref Range   WBC 6.4 4.0 - 10.5 K/uL   RBC 4.23 4.22 - 5.81 MIL/uL   Hemoglobin 12.5 (L) 13.0 - 17.0  g/dL   HCT 38.6 (L) 39.0 - 52.0 %   MCV 91.3 78.0 - 100.0 fL   MCH 29.6 26.0 - 34.0 pg   MCHC 32.4 30.0 - 36.0 g/dL   RDW 14.2 11.5 - 15.5 %   Platelets 177 150 - 400 K/uL  Type and  screen [place]     Status: None   Collection Time: 01/18/15  1:17 AM  Result Value Ref Range   ABO/RH(D) A POS    Antibody Screen NEG    Sample Expiration 01/21/2015   Glucose, capillary     Status: None   Collection Time: 01/18/15  7:44 AM  Result Value Ref Range   Glucose-Capillary 80 65 - 99 mg/dL    Ct Abdomen Pelvis W Contrast  01/17/2015  CLINICAL DATA:  Generalized abdominal pain. History of colonic surgery for ruptured diverticulosis. Gastrectomy for gastric tumor. EXAM: CT ABDOMEN AND PELVIS WITH CONTRAST TECHNIQUE: Multidetector CT imaging of the abdomen and pelvis was performed using the standard protocol following bolus administration of intravenous contrast. CONTRAST:  161m OMNIPAQUE IOHEXOL 300 MG/ML  SOLN COMPARISON:  11/13/2012 CT abdomen/ pelvis. FINDINGS: Lower chest: Subpleural left lower lobe 4 mm nodular focus (series 4/image 11), not definitely seen on prior CT. Hepatobiliary: Normal liver with no liver mass. Normal gallbladder with no radiopaque cholelithiasis. No biliary ductal dilatation. Pancreas: Normal, with no mass or duct dilation. Spleen: Normal size. No mass. Adrenals/Urinary Tract: Normal adrenals. Hypodense 0.7 cm lateral upper right renal lesion, too small to characterize, not appreciably changed, likely a benign renal cyst. Simple 6.4 cm renal cyst in the lower left kidney. Several additional subcentimeter hypodense lesions in both kidneys are too small to characterize. Nonspecific diffuse bladder wall thickening. Stomach/Bowel: There are stable postsurgical changes status post partial gastrectomy in the region of the gastric fundus, with otherwise grossly normal collapsed stomach. Normal caliber small bowel with no small bowel wall thickening. There is mild dilatation and wall thickening in the distal appendix with mild periappendiceal fat stranding, and a tip appendicitis cannot be excluded. Normal large bowel with no diverticulosis, large  bowel wall thickening or pericolonic fat stranding. Vascular/Lymphatic: Normal caliber abdominal aorta. Patent portal, splenic and renal veins. No pathologically enlarged lymph nodes in the abdomen or pelvis. Reproductive: Mild prostatomegaly. Other: No pneumoperitoneum, ascites or focal fluid collection. Musculoskeletal: No aggressive appearing focal osseous lesions. Moderate degenerative changes in the visualized thoracolumbar spine. Stable small fat containing umbilical hernia. Stable small fat containing bilateral inguinal hernias, right greater than left. IMPRESSION: 1. Dilatation and wall thickening in the distal appendix with mild periappendiceal fat stranding, cannot exclude a tip appendicitis. No evidence of perforation or abscess. 2. Nonspecific diffuse bladder wall thickening with mild prostatomegaly, which could indicate chronic bladder outlet obstruction, although an acute cystitis cannot be excluded and correlation with urinalysis is advised. 3. Left lower lobe 4 mm pulmonary nodule. If the patient is at high risk for bronchogenic carcinoma, follow-up chest CT at 1 year is recommended. If the patient is at low risk, no follow-up is needed. This recommendation follows the consensus statement: Guidelines for Management of Small Pulmonary Nodules Detected on CT Scans: A Statement from the [place] published in Radiology 2005; 2643:329-518 4. Small umbilical and bilateral inguinal hernias. These results were called by telephone at the time of interpretation on 01/17/2015 at 11:13 pm to Dr. YDarl Householder who verbally acknowledged these results. Electronically Signed   By: JIlona SorrelM.D.   On: 01/17/2015 23:15  Review of Systems  Constitutional: Negative for fever.  Respiratory: Negative for shortness of breath.   Cardiovascular: Negative for chest pain.  Gastrointestinal: Positive for nausea, vomiting and constipation. Negative for abdominal pain and blood in stool.  Genitourinary: Positive  for urgency and frequency.   Blood pressure 121/74, pulse 82, temperature 98 F (36.7 C), temperature source Oral, resp. rate 19, weight 104.01 kg (229 lb 4.8 oz), SpO2 100 %. Physical Exam  Vitals reviewed. Constitutional: He appears well-developed and well-nourished.  GI: Soft. Bowel sounds are normal. There is no tenderness.      Assessment/Plan: abd pain  I dont think he has appendicitis, no fevers, length of symptoms, nl wbc.  He has no rlq pain.  I think treating constipation and any urinary issues are appropriate.  States he gets colonoscopies.  Call back if needed  [facility] 01/18/2015, 11:24 AM

## 2015-01-18 NOTE — Progress Notes (Addendum)
PATIENT DETAILS Name: John Keller Age: 59 y.o. Sex: male Date of Birth: 25-Sep-1955 Admit Date: 01/17/2015 Admitting Physician Ivor Costa, MD [id number], Franck Audelia Acton, PA-C  Subjective: Some lower abdominal pain continues-but better than yesterday.  Assessment/Plan: Principal Problem: Abdominal pain: Mostly lower abdominal pain-suspect secondary to UTI. CT scan abdomen also suggestive of appendicitis-however not really tender in the right lower quadrant. Continue Rocephin/Flagyl, keep nothing by mouth until seen by general surgery-although likely not appendicitis clinically.Suspect we could discontinue Flagyl-once seen by surgery and not felt to have appendicitis.  Active Problems: UTI: Continue Rocephin, await urine cultures. Remains afebrile and without leukocytosis  GERD: Stable, continue PPI  History of depression: Stable without any suicidal/homicidal ideation-continue Pristiq and Novigil  History GIST (gastrointestinal stromal tumor), non-malignant  Left lower lobe 4 mm pulmonary nodule:repeat CT Chest in 1 year-defer to PCP  Disposition: Remain inpatient  Antimicrobial agents  See below  Anti-infectives    Start     Dose/Rate Route Frequency Ordered Stop   01/18/15 0100  cefTRIAXone (ROCEPHIN) 1 g in dextrose 5 % 50 mL IVPB     1 g 100 mL/hr over 30 Minutes Intravenous Every 24 hours 01/18/15 0045     01/18/15 0045  metroNIDAZOLE (FLAGYL) IVPB 500 mg     500 mg 100 mL/hr over 60 Minutes Intravenous Every 8 hours 01/18/15 0045     01/17/15 2330  cefTRIAXone (ROCEPHIN) 1 g in dextrose 5 % 50 mL IVPB     1 g 100 mL/hr over 30 Minutes Intravenous  Once 01/17/15 2323 01/18/15 0109   01/17/15 2330  metroNIDAZOLE (FLAGYL) IVPB 500 mg     500 mg 100 mL/hr over 60 Minutes Intravenous  Once 01/17/15 2329 01/18/15 0746      DVT Prophylaxis: Prophylactic Lovenox   Code Status: Full code   Family Communication None at  bedside  Procedures: None  CONSULTS:  general surgery  Time spent 30 minutes-Greater than 50% of this time was spent in counseling, explanation of diagnosis, planning of further management, and coordination of care.  MEDICATIONS: Scheduled Meds: . cefTRIAXone (ROCEPHIN)  IV  1 g Intravenous Q24H  . desvenlafaxine  50 mg Oral Daily  . [START ON 01/19/2015] Influenza vac split quadrivalent PF  0.5 mL Intramuscular Tomorrow-1000  . metronidazole  500 mg Intravenous Q8H  . modafinil  50 mg Oral Daily  . multivitamin with minerals  1 tablet Oral Daily  . pantoprazole  40 mg Oral Daily  . sodium chloride  3 mL Intravenous Q12H   Continuous Infusions: . dextrose 5 % and 0.45% NaCl 125 mL/hr at 01/18/15 0906   PRN Meds:.acetaminophen, hydroxypropyl methylcellulose / hypromellose, ondansetron (ZOFRAN) IV, traZODone    PHYSICAL EXAM: Vital signs in last 24 hours: Filed Vitals:   01/18/15 0100 01/18/15 0102 01/18/15 0146 01/18/15 0600  BP: 155/93 155/93 155/78 121/74  Pulse: 59 58 63 82  Temp:   97.9 F (36.6 C) 98 F (36.7 C)  TempSrc:   Oral Oral  Resp:  16 19 19   Weight:   104.01 kg (229 lb 4.8 oz)   SpO2: 98% 99% 99% 100%    Weight change:  Filed Weights   01/18/15 0146  Weight: 104.01 kg (229 lb 4.8 oz)   Body mass index is 32.9 kg/(m^2).   Gen Exam: Awake and alert with clear speech.   Neck: Supple, No JVD.   Chest: B/L Clear.  CVS: S1 S2 Regular, no murmurs.  Abdomen: soft, BS +, mildly tender in the suprapubic/pelvic area-hardly any tenderness in the right lower quadrant area. Abdomen is not distended. Extremities: no edema, lower extremities warm to touch. Neurologic: Non Focal.   Skin: No Rash.   Wounds: N/A.   Intake/Output from previous day:  Intake/Output Summary (Last 24 hours) at 01/18/15 1106 Last data filed at 01/18/15 1050  Gross per 24 hour  Intake 1352.08 ml  Output    600 ml  Net 752.08 ml     LAB RESULTS: CBC  Recent Labs Lab  01/17/15 1825 01/18/15 0109  WBC 7.5 6.4  HGB 13.3 12.5*  HCT 40.5 38.6*  PLT 205 177  MCV 92.0 91.3  MCH 30.2 29.6  MCHC 32.8 32.4  RDW 14.1 14.2    Chemistries   Recent Labs Lab 01/17/15 1825 01/18/15 0109  NA 138 138  K 4.1 3.6  CL 106 108  CO2 28 27  GLUCOSE 91 92  BUN 13 11  CREATININE 0.95 0.89  CALCIUM 8.4* 7.7*    CBG:  Recent Labs Lab 01/18/15 0744  GLUCAP 80    GFR CrCl cannot be calculated (Unknown ideal weight.).  Coagulation profile  Recent Labs Lab 01/18/15 0109  INR 1.08    Cardiac Enzymes No results for input(s): CKMB, TROPONINI, MYOGLOBIN in the last 168 hours.  Invalid input(s): CK  Invalid input(s): POCBNP No results for input(s): DDIMER in the last 72 hours. No results for input(s): HGBA1C in the last 72 hours. No results for input(s): CHOL, HDL, LDLCALC, TRIG, CHOLHDL, LDLDIRECT in the last 72 hours. No results for input(s): TSH, T4TOTAL, T3FREE, THYROIDAB in the last 72 hours.  Invalid input(s): FREET3 No results for input(s): VITAMINB12, FOLATE, FERRITIN, TIBC, IRON, RETICCTPCT in the last 72 hours.  Recent Labs  01/17/15 1825  LIPASE 36    Urine Studies No results for input(s): UHGB, CRYS in the last 72 hours.  Invalid input(s): UACOL, UAPR, USPG, UPH, UTP, UGL, UKET, UBIL, UNIT, UROB, ULEU, UEPI, UWBC, URBC, UBAC, CAST, UCOM, BILUA  MICROBIOLOGY: No results found for this or any previous visit (from the past 240 hour(s)).  RADIOLOGY STUDIES/RESULTS: Ct Abdomen Pelvis W Contrast  01/17/2015  CLINICAL DATA:  Generalized abdominal pain. History of colonic surgery for ruptured diverticulosis. Gastrectomy for gastric tumor. EXAM: CT ABDOMEN AND PELVIS WITH CONTRAST TECHNIQUE: Multidetector CT imaging of the abdomen and pelvis was performed using the standard protocol following bolus administration of intravenous contrast. CONTRAST:  158mL OMNIPAQUE IOHEXOL 300 MG/ML  SOLN COMPARISON:  11/13/2012 CT abdomen/ pelvis.  FINDINGS: Lower chest: Subpleural left lower lobe 4 mm nodular focus (series 4/image 11), not definitely seen on prior CT. Hepatobiliary: Normal liver with no liver mass. Normal gallbladder with no radiopaque cholelithiasis. No biliary ductal dilatation. Pancreas: Normal, with no mass or duct dilation. Spleen: Normal size. No mass. Adrenals/Urinary Tract: Normal adrenals. Hypodense 0.7 cm lateral upper right renal lesion, too small to characterize, not appreciably changed, likely a benign renal cyst. Simple 6.4 cm renal cyst in the lower left kidney. Several additional subcentimeter hypodense lesions in both kidneys are too small to characterize. Nonspecific diffuse bladder wall thickening. Stomach/Bowel: There are stable postsurgical changes status post partial gastrectomy in the region of the gastric fundus, with otherwise grossly normal collapsed stomach. Normal caliber small bowel with no small bowel wall thickening. There is mild dilatation and wall thickening in the distal appendix with mild periappendiceal fat stranding, and a tip appendicitis  cannot be excluded. Normal large bowel with no diverticulosis, large bowel wall thickening or pericolonic fat stranding. Vascular/Lymphatic: Normal caliber abdominal aorta. Patent portal, splenic and renal veins. No pathologically enlarged lymph nodes in the abdomen or pelvis. Reproductive: Mild prostatomegaly. Other: No pneumoperitoneum, ascites or focal fluid collection. Musculoskeletal: No aggressive appearing focal osseous lesions. Moderate degenerative changes in the visualized thoracolumbar spine. Stable small fat containing umbilical hernia. Stable small fat containing bilateral inguinal hernias, right greater than left. IMPRESSION: 1. Dilatation and wall thickening in the distal appendix with mild periappendiceal fat stranding, cannot exclude a tip appendicitis. No evidence of perforation or abscess. 2. Nonspecific diffuse bladder wall thickening with mild  prostatomegaly, which could indicate chronic bladder outlet obstruction, although an acute cystitis cannot be excluded and correlation with urinalysis is advised. 3. Left lower lobe 4 mm pulmonary nodule. If the patient is at high risk for bronchogenic carcinoma, follow-up chest CT at 1 year is recommended. If the patient is at low risk, no follow-up is needed. This recommendation follows the consensus statement: Guidelines for Management of Small Pulmonary Nodules Detected on CT Scans: A Statement from the [place] as published in Radiology 2005; 123456. 4. Small umbilical and bilateral inguinal hernias. These results were called by telephone at the time of interpretation on 01/17/2015 at 11:13 pm to Dr. Darl Householder, who verbally acknowledged these results. Electronically Signed   By: Ilona Sorrel M.D.   On: 01/17/2015 23:15    Oren Binet, MD  [facility] Pager:[phone]  If 7PM-7AM, please contact night-coverage [email or web address] Password [email or web address] 01/18/2015, 11:06 AM   LOS: 0 days

## 2015-01-18 NOTE — Evaluation (Signed)
Physical Therapy Evaluation Patient Details Name: John Keller MRN: [id number] DOB: 06/08/1955 Today's Date: 01/18/2015   History of Present Illness  John Keller is a 59 y.o. male with PMH of hypertension, GERD, depression, gastrointestinal stromal tumor (S/P resection), GI bleeding, who presents with abdominal pain and burning on urination  Clinical Impression   Patient evaluated by Physical Therapy with no further acute PT needs identified. All education has been completed and the patient has no further questions.  See below for any follow-up Physical Therapy or equipment needs. PT is signing off. Thank you for this referral.     Follow Up Recommendations No PT follow up    Equipment Recommendations  None recommended by PT    Recommendations for Other Services       Precautions / Restrictions Precautions Precautions: None      Mobility  Bed Mobility                  Transfers Overall transfer level: Modified independent               General transfer comment: Noted increase use of UEs to push up  Ambulation/Gait Ambulation/Gait assistance: Modified independent (Device/Increase time) Ambulation Distance (Feet): 520 Feet Assistive device: None (and pushing IV pole) Gait Pattern/deviations: Step-through pattern     General Gait Details: Walks with R foot slightly internally rotated; states this is chronic  Stairs Stairs: Yes Stairs assistance: Modified independent (Device/Increase time) Stair Management: No rails Number of Stairs: 3 General stair comments: step-by step stair management; quite slow, but steady; states this is how he tyupically negotiates his steps  Wheelchair Mobility    Modified Rankin (Stroke Patients Only)       Balance                                             Pertinent Vitals/Pain Pain Assessment: No/denies pain    Home Living Family/patient expects to be discharged to:: Private residence Living  Arrangements: Alone Available Help at Discharge: Friend(s) Type of Home: Apartment Home Access: Stairs to enter Entrance Stairs-Rails: None Entrance Stairs-Number of Steps: 10 (3+7) Home Layout: One level Home Equipment: None      Prior Function Level of Independence: Independent               Hand Dominance        Extremity/Trunk Assessment   Upper Extremity Assessment: Overall WFL for tasks assessed           Lower Extremity Assessment: Overall WFL for tasks assessed (thoguh slow-moving)         Communication   Communication: No difficulties  Cognition Arousal/Alertness: Awake/alert Behavior During Therapy: WFL for tasks assessed/performed Overall Cognitive Status: Within Functional Limits for tasks assessed                      General Comments      Exercises        Assessment/Plan    PT Assessment Patent does not need any further PT services  PT Diagnosis Acute pain   PT Problem List    PT Treatment Interventions     PT Goals (Current goals can be found in the Care Plan section) Acute Rehab PT Goals Patient Stated Goal: Hopes to feel better soon PT Goal Formulation: All assessment and education complete, DC therapy  Frequency     Barriers to discharge        Co-evaluation               End of Session   Activity Tolerance: Patient tolerated treatment well Patient left: Other (comment) (independently walkign the hallways) Nurse Communication: Mobility status         Time: [id number] PT Time Calculation (min) (ACUTE ONLY): 12 min   Charges:   PT Evaluation $Initial PT Evaluation Tier I: 1 Procedure     PT G CodesQuin Keller 01/18/2015, 5:26 PM  John Keller, [place] Pager [phone] Office [phone]

## 2015-01-19 DIAGNOSIS — D214 Benign neoplasm of connective and other soft tissue of abdomen: Secondary | ICD-10-CM

## 2015-01-19 DIAGNOSIS — N390 Urinary tract infection, site not specified: Principal | ICD-10-CM

## 2015-01-19 DIAGNOSIS — F329 Major depressive disorder, single episode, unspecified: Secondary | ICD-10-CM | POA: Diagnosis not present

## 2015-01-19 DIAGNOSIS — K219 Gastro-esophageal reflux disease without esophagitis: Secondary | ICD-10-CM | POA: Diagnosis not present

## 2015-01-19 DIAGNOSIS — R1084 Generalized abdominal pain: Secondary | ICD-10-CM | POA: Diagnosis not present

## 2015-01-19 LAB — CBC
HCT: 39 % (ref 39.0–52.0)
Hemoglobin: 12.6 g/dL — ABNORMAL LOW (ref 13.0–17.0)
MCH: 29.4 pg (ref 26.0–34.0)
MCHC: 32.3 g/dL (ref 30.0–36.0)
MCV: 91.1 fL (ref 78.0–100.0)
PLATELETS: 194 10*3/uL (ref 150–400)
RBC: 4.28 MIL/uL (ref 4.22–5.81)
RDW: 14 % (ref 11.5–15.5)
WBC: 6 10*3/uL (ref 4.0–10.5)

## 2015-01-19 LAB — URINE CULTURE: Culture: 6000

## 2015-01-19 LAB — BASIC METABOLIC PANEL
Anion gap: 5 (ref 5–15)
BUN: 8 mg/dL (ref 6–20)
CALCIUM: 8 mg/dL — AB (ref 8.9–10.3)
CO2: 28 mmol/L (ref 22–32)
CREATININE: 1 mg/dL (ref 0.61–1.24)
Chloride: 108 mmol/L (ref 101–111)
GFR calc Af Amer: >60 mL/min (ref >60)
GLUCOSE: 89 mg/dL (ref 65–99)
Potassium: 3.7 mmol/L (ref 3.5–5.1)
Sodium: 141 mmol/L (ref 135–145)

## 2015-01-19 LAB — GLUCOSE, CAPILLARY: Glucose-Capillary: 78 mg/dL (ref 65–99)

## 2015-01-19 NOTE — Progress Notes (Signed)
PATIENT DETAILS Name: John Keller Age: 59 y.o. Sex: male Date of Birth: 15-Sep-1955 Admit Date: 01/17/2015 Admitting Physician Ivor Costa, MD [id number], Blayn Audelia Acton, PA-C  Subjective: No abdominal pain-tolerating diet well.  Assessment/Plan: Principal Problem: Abdominal pain: Likely felt to be UTI, seen by surgery and not felt to have appendicitis. Tolerating diet, abdominal pain has completely resolved. Abdomen is soft and nontender .  Active Problems: UTI: Continue Rocephin, await urine cultures. Remains afebrile and without leukocytosis  GERD: Stable, continue PPI  History of depression: Stable without any suicidal/homicidal ideation-continue Pristiq and Novigil  History GIST (gastrointestinal stromal tumor), non-malignant  Left lower lobe 4 mm pulmonary nodule:repeat CT Chest in 1 year-defer to PCP  Disposition: Remain inpatient-suspect home on 11/22  Antimicrobial agents  See below  Anti-infectives    Start     Dose/Rate Route Frequency Ordered Stop   01/18/15 0100  cefTRIAXone (ROCEPHIN) 1 g in dextrose 5 % 50 mL IVPB     1 g 100 mL/hr over 30 Minutes Intravenous Every 24 hours 01/18/15 0045     01/18/15 0045  metroNIDAZOLE (FLAGYL) IVPB 500 mg  Status:  Discontinued     500 mg 100 mL/hr over 60 Minutes Intravenous Every 8 hours 01/18/15 0045 01/18/15 1147   01/17/15 2330  cefTRIAXone (ROCEPHIN) 1 g in dextrose 5 % 50 mL IVPB     1 g 100 mL/hr over 30 Minutes Intravenous  Once 01/17/15 2323 01/18/15 0109   01/17/15 2330  metroNIDAZOLE (FLAGYL) IVPB 500 mg     500 mg 100 mL/hr over 60 Minutes Intravenous  Once 01/17/15 2329 01/18/15 0746      DVT Prophylaxis: Prophylactic Lovenox   Code Status: Full code   Family Communication None at bedside  Procedures: None  CONSULTS:  general surgery  Time spent 30 minutes-Greater than 50% of this time was spent in counseling, explanation of diagnosis, planning of further management, and  coordination of care.  MEDICATIONS: Scheduled Meds: . cefTRIAXone (ROCEPHIN)  IV  1 g Intravenous Q24H  . desvenlafaxine  50 mg Oral Daily  . enoxaparin (LOVENOX) injection  40 mg Subcutaneous Q24H  . modafinil  50 mg Oral Daily  . multivitamin with minerals  1 tablet Oral Daily  . pantoprazole  40 mg Oral Daily  . polyethylene glycol  17 g Oral BID  . sodium chloride  3 mL Intravenous Q12H   Continuous Infusions:   PRN Meds:.acetaminophen, hydroxypropyl methylcellulose / hypromellose, traZODone    PHYSICAL EXAM: Vital signs in last 24 hours: Filed Vitals:   01/18/15 0600 01/18/15 1329 01/18/15 2154 01/19/15 0539  BP: 121/74 150/81 138/84 159/85  Pulse: 82 67 63 58  Temp: 98 F (36.7 C) 98.3 F (36.8 C) 98.3 F (36.8 C) 97.8 F (36.6 C)  TempSrc: Oral Oral Oral Oral  Resp: 19 17 18 16   Weight:      SpO2: 100% 100% 99% 98%    Weight change:  Filed Weights   01/18/15 0146  Weight: 104.01 kg (229 lb 4.8 oz)   Body mass index is 32.9 kg/(m^2).   Gen Exam: Awake and alert with clear speech.   Neck: Supple, No JVD.   Chest: B/L Clear.   CVS: S1 S2 Regular, no murmurs.  Abdomen: soft, BS +, Non tender . Abdomen is not distended. Extremities: no edema, lower extremities warm to touch. Neurologic: Non Focal.   Skin: No Rash.  Wounds: N/A.   Intake/Output from previous day:  Intake/Output Summary (Last 24 hours) at 01/19/15 1205 Last data filed at 01/19/15 0844  Gross per 24 hour  Intake 1973.67 ml  Output   2550 ml  Net -576.33 ml     LAB RESULTS: CBC  Recent Labs Lab 01/17/15 1825 01/18/15 0109 01/19/15 0534  WBC 7.5 6.4 6.0  HGB 13.3 12.5* 12.6*  HCT 40.5 38.6* 39.0  PLT 205 177 194  MCV 92.0 91.3 91.1  MCH 30.2 29.6 29.4  MCHC 32.8 32.4 32.3  RDW 14.1 14.2 14.0    Chemistries   Recent Labs Lab 01/17/15 1825 01/18/15 0109 01/19/15 0534  NA 138 138 141  K 4.1 3.6 3.7  CL 106 108 108  CO2 28 27 28   GLUCOSE 91 92 89  BUN 13 11 8     CREATININE 0.95 0.89 1.00  CALCIUM 8.4* 7.7* 8.0*    CBG:  Recent Labs Lab 01/18/15 0744 01/19/15 0718  GLUCAP 80 78    GFR CrCl cannot be calculated (Unknown ideal weight.).  Coagulation profile  Recent Labs Lab 01/18/15 0109  INR 1.08    Cardiac Enzymes No results for input(s): CKMB, TROPONINI, MYOGLOBIN in the last 168 hours.  Invalid input(s): CK  Invalid input(s): POCBNP No results for input(s): DDIMER in the last 72 hours. No results for input(s): HGBA1C in the last 72 hours. No results for input(s): CHOL, HDL, LDLCALC, TRIG, CHOLHDL, LDLDIRECT in the last 72 hours. No results for input(s): TSH, T4TOTAL, T3FREE, THYROIDAB in the last 72 hours.  Invalid input(s): FREET3 No results for input(s): VITAMINB12, FOLATE, FERRITIN, TIBC, IRON, RETICCTPCT in the last 72 hours.  Recent Labs  01/17/15 1825  LIPASE 36    Urine Studies No results for input(s): UHGB, CRYS in the last 72 hours.  Invalid input(s): UACOL, UAPR, USPG, UPH, UTP, UGL, UKET, UBIL, UNIT, UROB, ULEU, UEPI, UWBC, URBC, UBAC, CAST, UCOM, BILUA  MICROBIOLOGY: Recent Results (from the past 240 hour(s))  Urine culture     Status: None   Collection Time: 01/17/15  7:53 PM  Result Value Ref Range Status   Specimen Description URINE, RANDOM  Final   Special Requests NONE  Final   Culture 6,000 COLONIES/mL INSIGNIFICANT GROWTH  Final   Report Status 01/19/2015 FINAL  Final    RADIOLOGY STUDIES/RESULTS: Ct Abdomen Pelvis W Contrast  01/17/2015  CLINICAL DATA:  Generalized abdominal pain. History of colonic surgery for ruptured diverticulosis. Gastrectomy for gastric tumor. EXAM: CT ABDOMEN AND PELVIS WITH CONTRAST TECHNIQUE: Multidetector CT imaging of the abdomen and pelvis was performed using the standard protocol following bolus administration of intravenous contrast. CONTRAST:  162mL OMNIPAQUE IOHEXOL 300 MG/ML  SOLN COMPARISON:  11/13/2012 CT abdomen/ pelvis. FINDINGS: Lower chest:  Subpleural left lower lobe 4 mm nodular focus (series 4/image 11), not definitely seen on prior CT. Hepatobiliary: Normal liver with no liver mass. Normal gallbladder with no radiopaque cholelithiasis. No biliary ductal dilatation. Pancreas: Normal, with no mass or duct dilation. Spleen: Normal size. No mass. Adrenals/Urinary Tract: Normal adrenals. Hypodense 0.7 cm lateral upper right renal lesion, too small to characterize, not appreciably changed, likely a benign renal cyst. Simple 6.4 cm renal cyst in the lower left kidney. Several additional subcentimeter hypodense lesions in both kidneys are too small to characterize. Nonspecific diffuse bladder wall thickening. Stomach/Bowel: There are stable postsurgical changes status post partial gastrectomy in the region of the gastric fundus, with otherwise grossly normal collapsed stomach. Normal caliber small bowel  with no small bowel wall thickening. There is mild dilatation and wall thickening in the distal appendix with mild periappendiceal fat stranding, and a tip appendicitis cannot be excluded. Normal large bowel with no diverticulosis, large bowel wall thickening or pericolonic fat stranding. Vascular/Lymphatic: Normal caliber abdominal aorta. Patent portal, splenic and renal veins. No pathologically enlarged lymph nodes in the abdomen or pelvis. Reproductive: Mild prostatomegaly. Other: No pneumoperitoneum, ascites or focal fluid collection. Musculoskeletal: No aggressive appearing focal osseous lesions. Moderate degenerative changes in the visualized thoracolumbar spine. Stable small fat containing umbilical hernia. Stable small fat containing bilateral inguinal hernias, right greater than left. IMPRESSION: 1. Dilatation and wall thickening in the distal appendix with mild periappendiceal fat stranding, cannot exclude a tip appendicitis. No evidence of perforation or abscess. 2. Nonspecific diffuse bladder wall thickening with mild prostatomegaly, which could  indicate chronic bladder outlet obstruction, although an acute cystitis cannot be excluded and correlation with urinalysis is advised. 3. Left lower lobe 4 mm pulmonary nodule. If the patient is at high risk for bronchogenic carcinoma, follow-up chest CT at 1 year is recommended. If the patient is at low risk, no follow-up is needed. This recommendation follows the consensus statement: Guidelines for Management of Small Pulmonary Nodules Detected on CT Scans: A Statement from the [place] as published in Radiology 2005; 123456. 4. Small umbilical and bilateral inguinal hernias. These results were called by telephone at the time of interpretation on 01/17/2015 at 11:13 pm to Dr. Darl Householder, who verbally acknowledged these results. Electronically Signed   By: Ilona Sorrel M.D.   On: 01/17/2015 23:15    Oren Binet, MD  [facility] Pager:[phone]  If 7PM-7AM, please contact night-coverage [email or web address] Password [email or web address] 01/19/2015, 12:05 PM   LOS: 1 day

## 2015-01-20 ENCOUNTER — Telehealth: Payer: Self-pay | Admitting: Medical

## 2015-01-20 DIAGNOSIS — F329 Major depressive disorder, single episode, unspecified: Secondary | ICD-10-CM | POA: Diagnosis not present

## 2015-01-20 DIAGNOSIS — N390 Urinary tract infection, site not specified: Secondary | ICD-10-CM | POA: Diagnosis not present

## 2015-01-20 DIAGNOSIS — R1084 Generalized abdominal pain: Secondary | ICD-10-CM | POA: Diagnosis not present

## 2015-01-20 DIAGNOSIS — K219 Gastro-esophageal reflux disease without esophagitis: Secondary | ICD-10-CM | POA: Diagnosis not present

## 2015-01-20 LAB — GLUCOSE, CAPILLARY: GLUCOSE-CAPILLARY: 82 mg/dL (ref 65–99)

## 2015-01-20 MED ORDER — CIPROFLOXACIN HCL 500 MG PO TABS: 500.0000 mg | ORAL_TABLET | Freq: Two times a day (BID) | ORAL | Status: DC

## 2015-01-20 NOTE — Discharge Instructions (Signed)
Follow with Primary MD  Crisoforo Oxford, PA-C  And a MD of your choice at [facility] as instructed your Hospitalist MD  You have a Left lower lobe 4 mm Lung nodule:please ask your primary care MD to repeat CT Chest in 1 year-in some case this can turn out to be cancer.    Get Medicines reviewed and adjusted. Please take all your medications with you for your next visit with your Primary MD  Please request your Primary MD to go over all hospital tests and procedure/radiological results at the follow up, please ask your Primary MD to get all Hospital records sent to his/her office.  If you experience worsening of your admission symptoms, develop shortness of breath, life threatening emergency, suicidal or homicidal thoughts you must seek medical attention immediately by calling 911 or calling your MD immediately  if symptoms less severe.  You must read complete instructions/literature along with all the possible adverse reactions/side effects for all the Medicines you take and that have been prescribed to you. Take any new Medicines after you have completely understood and accpet all the possible adverse reactions/side effects.   Do not drive when taking Pain medications or sleeping medications (Benzodaizepines)  Do not take more than prescribed Pain, Sleep and Anxiety Medications  Special Instructions: If you have smoked or chewed Tobacco  in the last 2 yrs please stop smoking, stop any regular Alcohol  and or any Recreational drug use.  Wear Seat belts while driving.  Please note  You were cared for by a hospitalist during your hospital stay. Once you are discharged, your primary care physician will handle any further medical issues. Please note that NO REFILLS for any discharge medications will be authorized once you are discharged, as it is imperative that you return to your primary care physician (or establish a relationship with a primary care physician if you do not have one)  for your aftercare needs so that they can reassess your need for medications and monitor your lab values.

## 2015-01-20 NOTE — Progress Notes (Signed)
Patient discharged home with instructions. 

## 2015-01-20 NOTE — Telephone Encounter (Signed)
This patient was recently hospitalized.   Please call patient about hospital continuity of care.    See how they are doing. Review any medication changes per discharge summary. Schedule follow up appointment and make note on schedule that this is hospital f/u continuity of care visit.   

## 2015-01-20 NOTE — Discharge Summary (Signed)
PATIENT DETAILS Name: John Keller Age: 59 y.o. Sex: male Date of Birth: 1955-05-03 MRN: [id number]. Admitting Physician: Ivor Costa, MD UO:[id number], Jaece Audelia Acton, PA-C  Admit Date: 01/17/2015 Discharge date: 01/20/2015  Recommendations for Outpatient Follow-up:  1. Please consider referral to urology.  2. Please repeat CBC/BMET at next visit 3. Has left lower lobe 4 mm pulmonary nodule-please repeat CT chest in 6 months to-one year  PRIMARY DISCHARGE DIAGNOSIS:  Principal Problem:   Abdominal pain Active Problems:   GIST (gastrointestinal stromal tumor), non-malignant   Urinary tract infection   Depression   Hypertension   GERD (gastroesophageal reflux disease)   Appendicitis      PAST MEDICAL HISTORY: Past Medical History  Diagnosis Date  . Anemia 2009    noted during hospitalization for surgical I&D of chest, arm abscess  . Depression   . 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 GI bleed 2013  . Hypertension   . GERD (gastroesophageal reflux disease)     DISCHARGE MEDICATIONS: Current Discharge Medication List    START taking these medications   Details  ciprofloxacin (CIPRO) 500 MG tablet Take 1 tablet (500 mg total) by mouth 2 (two) times daily. Qty: 10 tablet, Refills: 0      CONTINUE these medications which have NOT CHANGED   Details  acetaminophen (TYLENOL) 500 MG tablet Take 1,000 mg by mouth every 6 (six) hours as needed (pain).    Armodafinil (NUVIGIL PO) Take by mouth. Pt has ran out of samples; waiting for approval for Rx    desvenlafaxine (PRISTIQ) 50 MG 24 hr tablet Take 50 mg by mouth daily.    dicyclomine (BENTYL) 20 MG tablet Take 1 tab twice daily. Qty: 60 tablet, Refills: 1    hydroxypropyl methylcellulose (ISOPTO TEARS) 2.5 % ophthalmic solution Place 1 drop into both eyes 4 (four) times daily as needed. For dry eyes    Multiple Vitamin (MULITIVITAMIN WITH MINERALS) TABS Take 1 tablet by mouth daily.    traZODone (DESYREL) 50 MG tablet  Take 50 mg by mouth at bedtime as needed for sleep.     omeprazole (PRILOSEC) 40 MG capsule Take 1 capsule (40 mg total) by mouth daily. Qty: 30 capsule, Refills: 3   Associated Diagnoses: Erosive gastritis        ALLERGIES:  No Known Allergies  BRIEF HPI:  See H&P, Labs, Consult and Test reports for all details in brief, patient was admitted for evaluation of lower abdominal pain along with dysuria.  CONSULTATIONS:   general surgery  PERTINENT RADIOLOGIC STUDIES: Ct Abdomen Pelvis W Contrast  01/17/2015  CLINICAL DATA:  Generalized abdominal pain. History of colonic surgery for ruptured diverticulosis. Gastrectomy for gastric tumor. EXAM: CT ABDOMEN AND PELVIS WITH CONTRAST TECHNIQUE: Multidetector CT imaging of the abdomen and pelvis was performed using the standard protocol following bolus administration of intravenous contrast. CONTRAST:  136mL OMNIPAQUE IOHEXOL 300 MG/ML  SOLN COMPARISON:  11/13/2012 CT abdomen/ pelvis. FINDINGS: Lower chest: Subpleural left lower lobe 4 mm nodular focus (series 4/image 11), not definitely seen on prior CT. Hepatobiliary: Normal liver with no liver mass. Normal gallbladder with no radiopaque cholelithiasis. No biliary ductal dilatation. Pancreas: Normal, with no mass or duct dilation. Spleen: Normal size. No mass. Adrenals/Urinary Tract: Normal adrenals. Hypodense 0.7 cm lateral upper right renal lesion, too small to characterize, not appreciably changed, likely a benign renal cyst. Simple 6.4 cm renal cyst in the lower left kidney. Several additional subcentimeter hypodense lesions in both kidneys  are too small to characterize. Nonspecific diffuse bladder wall thickening. Stomach/Bowel: There are stable postsurgical changes status post partial gastrectomy in the region of the gastric fundus, with otherwise grossly normal collapsed stomach. Normal caliber small bowel with no small bowel wall thickening. There is mild dilatation and wall thickening in the  distal appendix with mild periappendiceal fat stranding, and a tip appendicitis cannot be excluded. Normal large bowel with no diverticulosis, large bowel wall thickening or pericolonic fat stranding. Vascular/Lymphatic: Normal caliber abdominal aorta. Patent portal, splenic and renal veins. No pathologically enlarged lymph nodes in the abdomen or pelvis. Reproductive: Mild prostatomegaly. Other: No pneumoperitoneum, ascites or focal fluid collection. Musculoskeletal: No aggressive appearing focal osseous lesions. Moderate degenerative changes in the visualized thoracolumbar spine. Stable small fat containing umbilical hernia. Stable small fat containing bilateral inguinal hernias, right greater than left. IMPRESSION: 1. Dilatation and wall thickening in the distal appendix with mild periappendiceal fat stranding, cannot exclude a tip appendicitis. No evidence of perforation or abscess. 2. Nonspecific diffuse bladder wall thickening with mild prostatomegaly, which could indicate chronic bladder outlet obstruction, although an acute cystitis cannot be excluded and correlation with urinalysis is advised. 3. Left lower lobe 4 mm pulmonary nodule. If the patient is at high risk for bronchogenic carcinoma, follow-up chest CT at 1 year is recommended. If the patient is at low risk, no follow-up is needed. This recommendation follows the consensus statement: Guidelines for Management of Small Pulmonary Nodules Detected on CT Scans: A Statement from the [facility] as published in Radiology 2005; 123456. 4. Small umbilical and bilateral inguinal hernias. These results were called by telephone at the time of interpretation on 01/17/2015 at 11:13 pm to Dr. Darl Householder, who verbally acknowledged these results. Electronically Signed   By: Ilona Sorrel M.D.   On: 01/17/2015 23:15     PERTINENT LAB RESULTS: CBC:  Recent Labs  01/18/15 0109 01/19/15 0534  WBC 6.4 6.0  HGB 12.5* 12.6*  HCT 38.6* 39.0  PLT 177 194     CMET CMP     Component Value Date/Time   NA 141 01/19/2015 0534   K 3.7 01/19/2015 0534   CL 108 01/19/2015 0534   CO2 28 01/19/2015 0534   GLUCOSE 89 01/19/2015 0534   BUN 8 01/19/2015 0534   CREATININE 1.00 01/19/2015 0534   CREATININE 1.04 11/27/2013 1011   CALCIUM 8.0* 01/19/2015 0534   PROT 6.5 01/17/2015 1825   ALBUMIN 3.1* 01/17/2015 1825   AST 20 01/17/2015 1825   ALT 13* 01/17/2015 1825   ALKPHOS 81 01/17/2015 1825   BILITOT 0.4 01/17/2015 1825   GFRNONAA >60 01/19/2015 0534   GFRAA >60 01/19/2015 0534    GFR CrCl cannot be calculated (Unknown ideal weight.).  Recent Labs  01/17/15 1825  LIPASE 36   No results for input(s): CKTOTAL, CKMB, CKMBINDEX, TROPONINI in the last 72 hours. Invalid input(s): POCBNP No results for input(s): DDIMER in the last 72 hours. No results for input(s): HGBA1C in the last 72 hours. No results for input(s): CHOL, HDL, LDLCALC, TRIG, CHOLHDL, LDLDIRECT in the last 72 hours. No results for input(s): TSH, T4TOTAL, T3FREE, THYROIDAB in the last 72 hours.  Invalid input(s): FREET3 No results for input(s): VITAMINB12, FOLATE, FERRITIN, TIBC, IRON, RETICCTPCT in the last 72 hours. Coags:  Recent Labs  01/18/15 0109  INR 1.08   Microbiology: Recent Results (from the past 240 hour(s))  Urine culture     Status: None   Collection Time: 01/17/15  7:53 PM  Result Value Ref Range Status   Specimen Description URINE, RANDOM  Final   Special Requests NONE  Final   Culture 6,000 COLONIES/mL INSIGNIFICANT GROWTH  Final   Report Status 01/19/2015 FINAL  Final     BRIEF HOSPITAL COURSE:  Abdominal pain:  Presented with dysuria and abdominal pain, CT scan on admission was questionable for appendicitis. Patient was seen by general surgery-and not felt to have appendicitis. He was treated with IV Rocephin while hospitalized, unfortunately urine cultures show insignificant growth-because of his symptoms would empirically treat him for one  week total. He is completely asymptomatic at discharge-he does not have any further dysuria or abdominal pain. On exam-abdomen is soft and nontender. He has been asked to follow-up with his PCP, and consider outpatient referral to urology to assess his prostate. .  Active Problems: UTI:as above. on Rocephin while inpatient-will transition to ciprofloxacin. Afebrile/no leukocytosis-completely asymptomatic at the time of discharge with complete resolution of both dysuria and abdominal pain.   GERD: Stable, continue PPI  History of depression: Stable without any suicidal/homicidal ideation-continue Pristiq and Novigil-patient has to follow up with his primary psychiatrist on discharge.   History GIST (gastrointestinal stromal tumor), non-malignant  Left lower lobe 4 mm pulmonary nodule:repeat CT Chest in 1 year-defer to PCP.Patient aware of this diagnosis-have instructed him to follow-up with his PCP for further continued surveillance/monitoring. He is aware of risk of malignancy in some cases.   TODAY-DAY OF DISCHARGE:  Subjective:   John Keller today has no headache,no chest abdominal pain,no new weakness tingling or numbness, feels much better wants to go home today.  Objective:   Blood pressure 141/87, pulse 68, temperature 98.1 F (36.7 C), temperature source Oral, resp. rate 16, weight 104.01 kg (229 lb 4.8 oz), SpO2 98 %.  Intake/Output Summary (Last 24 hours) at 01/20/15 1109 Last data filed at 01/20/15 1043  Gross per 24 hour  Intake   1023 ml  Output   1050 ml  Net    -27 ml   Filed Weights   01/18/15 0146  Weight: 104.01 kg (229 lb 4.8 oz)    Exam Awake Alert, Oriented *3, No new F.N deficits, Normal affect [place].AT,PERRAL Supple Neck,No JVD, No cervical lymphadenopathy appriciated.  Symmetrical Chest wall movement, Good air movement bilaterally, CTAB RRR,No Gallops,Rubs or new Murmurs, No Parasternal Heave +ve B.Sounds, Abd Soft, Non tender, No organomegaly  appriciated, No rebound -guarding or rigidity. No Cyanosis, Clubbing or edema, No new Rash or bruise  DISCHARGE CONDITION: Stable  DISPOSITION: Home  DISCHARGE INSTRUCTIONS:    Activity:  As tolerated with Full fall precautions use walker/cane & assistance as needed  You have a Left lower lobe 4 mm Lung nodule:please ask your primary care MD to repeat CT Chest in 1 year-in some case this can turn out to be cancer.   Get Medicines reviewed and adjusted: Please take all your medications with you for your next visit with your Primary MD  Please request your Primary MD to go over all hospital tests and procedure/radiological results at the follow up, please ask your Primary MD to get all Hospital records sent to his/her office.  If you experience worsening of your admission symptoms, develop shortness of breath, life threatening emergency, suicidal or homicidal thoughts you must seek medical attention immediately by calling 911 or calling your MD immediately  if symptoms less severe.  You must read complete instructions/literature along with all the possible adverse reactions/side effects for all the Medicines you take and  that have been prescribed to you. Take any new Medicines after you have completely understood and accpet all the possible adverse reactions/side effects.   Do not drive when taking Pain medications.   Do not take more than prescribed Pain, Sleep and Anxiety Medications  Special Instructions: If you have smoked or chewed Tobacco  in the last 2 yrs please stop smoking, stop any regular Alcohol  and or any Recreational drug use.  Wear Seat belts while driving.  Please note You were cared for by a hospitalist during your hospital stay. Once you are discharged, your primary care physician will handle any further medical issues. Please note that NO REFILLS for any discharge medications will be authorized once you are discharged, as it is imperative that you return to your  primary care physician (or establish a relationship with a primary care physician if you do not have one) for your aftercare needs so that they can reassess your need for medications and monitor your lab values.   Diet recommendation: Heart Healthy diet  Discharge Instructions    Call MD for:  persistant nausea and vomiting    Complete by:  As directed      Call MD for:  severe uncontrolled pain    Complete by:  As directed      Call MD for:  temperature >100.4    Complete by:  As directed      Diet - low sodium heart healthy    Complete by:  As directed      Increase activity slowly    Complete by:  As directed            Follow-up Information    Follow up with Crisoforo Oxford, PA-C. Schedule an appointment as soon as possible for a visit in 1 week.   Specialty:  Family Medicine   Contact information:   [address] [place] [address] [phone]       Follow up with [facility]. Schedule an appointment as soon as possible for a visit in 2 weeks.   Why:  with a MD of your choice   Contact information:   [place] [phone]      Total Time spent on discharge equals 25  minutes.  SignedOren Binet 01/20/2015 11:09 AM

## 2015-01-26 ENCOUNTER — Ambulatory Visit (INDEPENDENT_AMBULATORY_CARE_PROVIDER_SITE_OTHER): Payer: BLUE CROSS/BLUE SHIELD | Admitting: Medical

## 2015-01-26 ENCOUNTER — Encounter: Payer: Self-pay | Admitting: Medical

## 2015-01-26 VITALS — BP 120/80 | HR 78 | Wt 232.0 lb

## 2015-01-26 DIAGNOSIS — R911 Solitary pulmonary nodule: Secondary | ICD-10-CM

## 2015-01-26 DIAGNOSIS — N390 Urinary tract infection, site not specified: Secondary | ICD-10-CM | POA: Diagnosis not present

## 2015-01-26 DIAGNOSIS — R1030 Lower abdominal pain, unspecified: Secondary | ICD-10-CM

## 2015-01-26 DIAGNOSIS — N400 Enlarged prostate without lower urinary tract symptoms: Secondary | ICD-10-CM

## 2015-01-26 LAB — CBC
HCT: 43 % (ref 39.0–52.0)
Hemoglobin: 14.1 g/dL (ref 13.0–17.0)
MCH: 29.7 pg (ref 26.0–34.0)
MCHC: 32.8 g/dL (ref 30.0–36.0)
MCV: 90.7 fL (ref 78.0–100.0)
MPV: 10 fL (ref 8.6–12.4)
PLATELETS: 264 10*3/uL (ref 150–400)
RBC: 4.74 MIL/uL (ref 4.22–5.81)
RDW: 14.5 % (ref 11.5–15.5)
WBC: 7.4 10*3/uL (ref 4.0–10.5)

## 2015-01-26 LAB — POCT URINALYSIS DIPSTICK
BILIRUBIN UA: NEGATIVE
GLUCOSE UA: NEGATIVE
KETONES UA: NEGATIVE
Leukocytes, UA: NEGATIVE
NITRITE UA: NEGATIVE
PH UA: 6.5
Protein, UA: NEGATIVE
RBC UA: NEGATIVE
SPEC GRAV UA: 1.025
Urobilinogen, UA: NEGATIVE

## 2015-01-26 LAB — BASIC METABOLIC PANEL
BUN: 17 mg/dL (ref 7–25)
CALCIUM: 8.7 mg/dL (ref 8.6–10.3)
CO2: 26 mmol/L (ref 20–31)
CREATININE: 1.05 mg/dL (ref 0.70–1.33)
Chloride: 108 mmol/L (ref 98–110)
GLUCOSE: 73 mg/dL (ref 65–99)
Potassium: 3.9 mmol/L (ref 3.5–5.3)
Sodium: 142 mmol/L (ref 135–146)

## 2015-01-26 MED ORDER — CIPROFLOXACIN HCL 500 MG PO TABS: 500.0000 mg | ORAL_TABLET | Freq: Two times a day (BID) | ORAL | Status: DC

## 2015-01-26 NOTE — Patient Instructions (Signed)
Encounter Diagnoses  Name Primary?  . Urinary tract infection, site not specified Yes  . Lower abdominal pain   . Solitary pulmonary nodule   . Prostate enlargement    Recommendations:  Continue Cipro antibiotic for another 1- 2 weeks until symptoms are ALL gone  Make sure you are drinking plenty of water  We will call with lab results  If you get worsening pain, fever, nausea, vomiting, particulalry pain in the right lower abdomen, then recheck right away  In general return for follow up or physical in 2 weeks

## 2015-01-26 NOTE — Progress Notes (Signed)
Subjective: Here for hospital f/u.   Was hospitalized 01/17/2015 - 01/20/2015 for abdominal pain and dysuria. Was put on Cipro for UTI and he has 1 tablet left.   Overall the abdominal pain is mostly improved.   But in last few days having some discomfort.  Denies fever, nausea, vomiting. Having some loose stool but attributes this to Miralax given at the hospital for constipation.  Also here f/u on pulmonary nodule seen on abdominal CT.   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   Prior to this hospitalization, he reports having on average 1 time at night where he gets up to urinate.  Denies in general having urinary stream changes, no hesitancy, no dribbling.  He does note one prior UTI after prior hospitalization few years ago, but no hx/o prostate infection.  No other aggravating or relieving factors. No other complaint.  Past Medical History  Diagnosis Date  . Anemia 2009    noted during hospitalization for surgical I&D of chest, arm abscess  . Depression   . 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 GI bleed 2013  . Hypertension   . GERD (gastroesophageal reflux disease)    Family History  Problem Relation Age of Onset  . Anesthesia problems Neg Hx   . Hypotension Neg Hx   . Malignant hyperthermia Neg Hx   . Pseudochol deficiency Neg Hx   . Diabetes Mother   . Stroke Mother   . Aneurysm Father     died of brain aneurysm   ROS as in subjective   Objective: BP 120/80 mmHg  Pulse 78  Wt 232 lb (105.235 kg)  General appearance: alert, no distress, WD/WN Lungs clear Abdomen: +bs, soft, tender RLQ and suprapubic region otherwise non tender, non distended,there is a small umbilical hernia and ventral surgical scar, no masses, no hepatomegaly, no splenomegaly Pulses: 2+ symmetric, upper and lower extremities, normal cap  refill GU: normal male, circumcised, mildly tender left testicular, but no swelling, no rash, no hernia in inguinal region DRE: mildly tender and boggy prostate, moderately enlarged prostate, no nodules   Assessment: Encounter Diagnoses  Name Primary?  . Urinary tract infection, site not specified Yes  . Lower abdominal pain   . Solitary pulmonary nodule   . Prostate enlargement     Plan: Reviewed hospitalization reports, labs, CT scan.   We will plan CT lung f/u on nodule in 6-74mo at his discretion.   He had second hand smoke exposure in childhood but otherwise low risk.   Labs today.   Lets treat for possible prostatitis unresolved with another 1-2 weeks of Cipro.  discussed returning if worsening RLQ abdominal pain, or other signs of appendicitis as discussed.   F/u 2wk otherwise

## 2015-01-27 LAB — PSA: PSA: 12.87 ng/mL — AB (ref <=4.00)

## 2015-02-18 ENCOUNTER — Ambulatory Visit (INDEPENDENT_AMBULATORY_CARE_PROVIDER_SITE_OTHER): Payer: BLUE CROSS/BLUE SHIELD | Admitting: Medical

## 2015-02-18 ENCOUNTER — Telehealth: Payer: Self-pay | Admitting: Medical

## 2015-02-18 ENCOUNTER — Encounter: Payer: Self-pay | Admitting: Medical

## 2015-02-18 VITALS — BP 110/88 | HR 72 | Wt 225.0 lb

## 2015-02-18 DIAGNOSIS — R5382 Chronic fatigue, unspecified: Secondary | ICD-10-CM | POA: Diagnosis not present

## 2015-02-18 DIAGNOSIS — R51 Headache: Secondary | ICD-10-CM

## 2015-02-18 DIAGNOSIS — G478 Other sleep disorders: Secondary | ICD-10-CM | POA: Diagnosis not present

## 2015-02-18 DIAGNOSIS — G479 Sleep disorder, unspecified: Secondary | ICD-10-CM | POA: Diagnosis not present

## 2015-02-18 DIAGNOSIS — N419 Inflammatory disease of prostate, unspecified: Secondary | ICD-10-CM

## 2015-02-18 DIAGNOSIS — R972 Elevated prostate specific antigen [PSA]: Secondary | ICD-10-CM

## 2015-02-18 DIAGNOSIS — R519 Headache, unspecified: Secondary | ICD-10-CM

## 2015-02-18 DIAGNOSIS — G8929 Other chronic pain: Secondary | ICD-10-CM

## 2015-02-18 LAB — LIPID PANEL
CHOL/HDL RATIO: 3.5 ratio (ref <=5.0)
Cholesterol: 154 mg/dL (ref 125–200)
HDL: 44 mg/dL (ref >=40)
LDL CALC: 99 mg/dL (ref <130)
Triglycerides: 57 mg/dL (ref <150)
VLDL: 11 mg/dL (ref <30)

## 2015-02-18 LAB — TSH: TSH: 3.903 u[IU]/mL (ref 0.350–4.500)

## 2015-02-18 NOTE — Progress Notes (Addendum)
Subjective: Chief Complaint  Patient presents with  . Prostatitis    follow up. said it is going "ok" has urology appt on friday. said if he has coffee the "smell" comes back temporarily.   Here for f/u.    At last visit we continued therapy with Cipro for likely prostatitis.   PSA lab ended up being elevated.   Overall improved.  No additional burning with urination, no more constipation, no urinary c/o, just still feels a little unusual in pelvic area, not 100% back to normal.    He made a self referral to Urology for this Friday.  His main c/o today is chronic headaches for the past 1-2 years.   No prior eval with neurology.  He takes Nuvigil, Trazodone and Pristiq, prescribed by psychiatry.   He works 2 jobs.  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  Only gets 4- 5 hours of sleep nightly.   He notes always being fatigued, doesn't wake rested, gets sleepy in the day time .  No prior sleep study. Lives alone, so no witnessed apnea or snoring.   Gets headaches from time to time, on average 2 per week. sometimes they last days, gets photophobia, can be generalized or throbbing, usually frontal headaches.  Eyesight will go out of focus.   Uses tylenol or wet cloth on forehead.    No paresthesias.  No nausea or vomiting.    Eats on average 2 meals daily.  Drinks at least 1 soda daily, coffee maybe 2 times per week.  No other aggravating or relieving factors. No other complaint.  Past Medical History  Diagnosis Date  . Anemia 2009    noted during hospitalization for surgical I&D of chest, arm abscess  . Depression   . 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 GI bleed 2013  . Hypertension   . GERD (gastroesophageal reflux disease)    ROS as in subjective   Objective: BP 110/88 mmHg  Pulse 72  Wt 225 lb (102.059 kg)  General appearance: alert, no distress, WD/WN, speech is  somewhat slower than average and he seems to process thoughts a little slow. HEENT: normocephalic, sclerae anicteric, PERRLA, EOMi, nares patent, no discharge or erythema, pharynx normal Oral cavity: MMM, no lesions Neck: supple, no lymphadenopathy, no thyromegaly, no masses, no bruits Heart: RRR, normal S1, S2, no murmurs Lungs: CTA bilaterally, no wheezes, rhonchi, or rales Abdomen: +bs, soft, non tender, non distended, no masses, no hepatomegaly, no splenomegaly Musculoskeletal: nontender, no swelling, no obvious deformity Extremities: no edema, no cyanosis, no clubbing Pulses: 2+ symmetric, upper and lower extremities, normal cap refill Neurological: alert, oriented x 3, visual fields WNL, seems to have slight physiological tremor with finger to nose, but no resting tremor.   Otherwise CN2-12 intact, strength normal upper extremities and lower extremities, sensation normal throughout, DTRs 2+ throughout, no cerebellar signs, gait normal Psychiatric: somewhat flat affect, pleasant, answers questions appropriately DRE - deferred since he is seeing Urology Friday.   Assessment: Encounter Diagnoses  Name Primary?  . Chronic nonintractable headache, unspecified headache type Yes  . PSA elevation   . Prostatitis, unspecified prostatitis type   . Chronic fatigue   . Sleep disturbance   . Non-restorative sleep     Plan: Chronic headaches - etiology unclear.   Migraine vs other secondary cause, possible OSA.  Will set up for sleep study, consider head imaging going forward.   Advised he not skip meals, avoid excessive caffeine, work to improve sleep schedule as he only gets 4-5 hours per night, working 2 jobs.      PSA elevation, prostatitis - improved symptomatically from last visit but not 100% back to normal.  Repeat labs today and he made self referral to Urology for this Friday.  We will send copy of labs and note to them.    chronic fatigue, sleep issues - f/u pending sleep study.    Epworth sleep score 15, neck circumference 16".  Of note, he sees psych for the Nuvigil.

## 2015-02-18 NOTE — Addendum Note (Signed)
Addended by: Carlena Hurl on: 02/18/2015 09:32 AM   Modules accepted: Orders

## 2015-02-18 NOTE — Telephone Encounter (Signed)
pls set him up ASAP, preferably before end of year for sleep study, [facility].

## 2015-02-18 NOTE — Telephone Encounter (Signed)
Faxed to [facility]

## 2015-02-19 LAB — VITAMIN D 25 HYDROXY (VIT D DEFICIENCY, FRACTURES): VIT D 25 HYDROXY: 29 ng/mL — AB (ref 30–100)

## 2015-02-20 ENCOUNTER — Other Ambulatory Visit: Payer: Self-pay | Admitting: Medical

## 2015-02-20 LAB — PSA, TOTAL AND FREE
PSA, Free Pct: 20 % — ABNORMAL LOW (ref >25)
PSA, Free: 0.84 ng/mL
PSA: 4.23 ng/mL — ABNORMAL HIGH (ref <=4.00)

## 2015-02-20 MED ORDER — SULFAMETHOXAZOLE-TRIMETHOPRIM 800-160 MG PO TABS: 1.0000 | ORAL_TABLET | Freq: Two times a day (BID) | ORAL | Status: DC

## 2015-02-20 MED ORDER — VITAMIN D (ERGOCALCIFEROL) 1.25 MG (50000 UNIT) PO CAPS
50000.0000 [IU] | ORAL_CAPSULE | ORAL | Status: DC
Start: 2015-02-20 — End: 2015-08-18

## 2015-03-05 ENCOUNTER — Telehealth: Payer: Self-pay

## 2015-03-05 NOTE — Telephone Encounter (Signed)
Called [facility] and spoke with heather she has a machine and is trying to get him picking it up today

## 2015-03-25 ENCOUNTER — Telehealth: Payer: Self-pay | Admitting: Medical

## 2015-03-25 NOTE — Telephone Encounter (Signed)
Sleep study shows mild sleep apnea. I recommend a trial of CPAP.  Please order CPAP with auto titration if agreeable.   F/u in 7mo

## 2015-03-26 NOTE — Telephone Encounter (Signed)
appt made and sending cpap order form to [facility]

## 2015-04-27 ENCOUNTER — Ambulatory Visit (INDEPENDENT_AMBULATORY_CARE_PROVIDER_SITE_OTHER): Payer: BLUE CROSS/BLUE SHIELD | Admitting: Medical

## 2015-04-27 ENCOUNTER — Encounter: Payer: Self-pay | Admitting: Medical

## 2015-04-27 VITALS — BP 160/102 | HR 78 | Wt 226.0 lb

## 2015-04-27 DIAGNOSIS — I10 Essential (primary) hypertension: Secondary | ICD-10-CM

## 2015-04-27 DIAGNOSIS — R5382 Chronic fatigue, unspecified: Secondary | ICD-10-CM | POA: Diagnosis not present

## 2015-04-27 DIAGNOSIS — G8929 Other chronic pain: Secondary | ICD-10-CM

## 2015-04-27 DIAGNOSIS — G4730 Sleep apnea, unspecified: Secondary | ICD-10-CM | POA: Insufficient documentation

## 2015-04-27 DIAGNOSIS — R519 Headache, unspecified: Secondary | ICD-10-CM | POA: Insufficient documentation

## 2015-04-27 DIAGNOSIS — R972 Elevated prostate specific antigen [PSA]: Secondary | ICD-10-CM | POA: Diagnosis not present

## 2015-04-27 DIAGNOSIS — R51 Headache: Secondary | ICD-10-CM

## 2015-04-27 DIAGNOSIS — E559 Vitamin D deficiency, unspecified: Secondary | ICD-10-CM | POA: Diagnosis not present

## 2015-04-27 MED ORDER — METOPROLOL TARTRATE 25 MG PO TABS: 25.0000 mg | ORAL_TABLET | Freq: Two times a day (BID) | ORAL | Status: DC

## 2015-04-27 NOTE — Progress Notes (Signed)
Subjective: Chief Complaint  Patient presents with  . Sleep Apnea    did not pick up cpap machine. [facility] didnt approve it. states [facility] did state they checked his insurance and had him fill out a financial aid form but hasnt heard back.    Here for f/u on sleep study, vit D, fatigue.   After last visit sleep study showed mild sleep apnea, although he notes ongoing fatigue, not sleeping well, and chronic headaches.   He notes since last visit there was a financial issue getting CPAP covered.   He is getting some exercise. He has f/u with Urology regarding PSA.   He is taking the Vit D prescription.   He has no new c/o other than car troubles.  No other aggravating or relieving factors. No other complaint.    Past Medical History  Diagnosis Date  . Anemia 2009    noted during hospitalization for surgical I&D of chest, arm abscess  . Depression   . 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 GI bleed 2013  . Hypertension   . GERD (gastroesophageal reflux disease)    ROS as in subjective   Objective: BP 160/102 mmHg  Pulse 78  Wt 226 lb (102.513 kg)  BP Readings from Last 3 Encounters:  04/27/15 160/102  02/18/15 110/88  01/26/15 120/80   Gen: wd, wn, nad otherwise not examined    Assessment: Encounter Diagnoses  Name Primary?  . Chronic nonintractable headache, unspecified headache type Yes  . Mild sleep apnea   . Vitamin D deficiency   . Chronic fatigue   . PSA elevation   . Essential hypertension     Plan: Begin Metoprolol 25mg  BID for Bp and headaches.  Discussed risks/benefits of medication.  Call report 99mo. chronic fatigue - advised he begin CPAP, work with [facility] to get financial solution.   HTN - begin Metoprolol PSA elevated - he has f/u with urology already scheduled   Patient Instructions   Encounter Diagnoses  Name Primary?  . Chronic nonintractable headache, unspecified headache type Yes  . Mild sleep apnea   . Vitamin D deficiency   . Chronic  fatigue   . PSA elevation     Recommendations: Chronic headaches - begin Metoprolol twice daily for blood pressure and headache prevention.   Let me know in 1 month how this is doing  Sleep apnea- [facility] will be contacting you today to find a solution to get your CPAP going  Vitamin D deficiency - when you finish out the prescription Vitamin D, then begin OTC Vitamin D 1000 IU daily  Chronic fatigue - work on getting good sleep, exercising regularly  PSA elevation - follow up with Urology as planned  Good luck with the car repairs

## 2015-04-27 NOTE — Patient Instructions (Signed)
Encounter Diagnoses  Name Primary?  . Chronic nonintractable headache, unspecified headache type Yes  . Mild sleep apnea   . Vitamin D deficiency   . Chronic fatigue   . PSA elevation     Recommendations: Chronic headaches - begin Metoprolol twice daily for blood pressure and headache prevention.   Let me know in 1 month how this is doing  Sleep apnea- [facility] will be contacting you today to find a solution to get your CPAP going  Vitamin D deficiency - when you finish out the prescription Vitamin D, then begin OTC Vitamin D 1000 IU daily  Chronic fatigue - work on getting good sleep, exercising regularly  PSA elevation - follow up with Urology as planned  Good luck with the car repairs

## 2015-05-26 DIAGNOSIS — G4733 Obstructive sleep apnea (adult) (pediatric): Secondary | ICD-10-CM | POA: Diagnosis not present

## 2015-06-01 DIAGNOSIS — F840 Autistic disorder: Secondary | ICD-10-CM | POA: Diagnosis not present

## 2015-06-16 ENCOUNTER — Telehealth: Payer: Self-pay | Admitting: Medical

## 2015-06-16 NOTE — Telephone Encounter (Signed)
Pt dropped of form for [employer] to be completed. Please call when ready

## 2015-06-16 NOTE — Telephone Encounter (Signed)
Done, return form

## 2015-06-17 NOTE — Telephone Encounter (Signed)
John Keller states that you have the Form!!!

## 2015-06-17 NOTE — Telephone Encounter (Signed)
Left on pts voicemail that i faxed the form and if he would like to pick it up as well to call the office and let me know

## 2015-06-19 ENCOUNTER — Telehealth: Payer: Self-pay | Admitting: Medical

## 2015-06-19 NOTE — Telephone Encounter (Signed)
Pt dropped off a form for completion. Please fax to number highlighted on the form. Sending back for completion.

## 2015-06-19 NOTE — Telephone Encounter (Signed)
John Keller had the original one and she will refax and let pt know.

## 2015-06-22 DIAGNOSIS — F840 Autistic disorder: Secondary | ICD-10-CM | POA: Diagnosis not present

## 2015-06-26 DIAGNOSIS — G4733 Obstructive sleep apnea (adult) (pediatric): Secondary | ICD-10-CM | POA: Diagnosis not present

## 2015-07-06 DIAGNOSIS — F840 Autistic disorder: Secondary | ICD-10-CM | POA: Diagnosis not present

## 2015-07-13 ENCOUNTER — Encounter: Payer: Self-pay | Admitting: Medical

## 2015-07-13 ENCOUNTER — Ambulatory Visit (INDEPENDENT_AMBULATORY_CARE_PROVIDER_SITE_OTHER): Payer: BLUE CROSS/BLUE SHIELD | Admitting: Medical

## 2015-07-13 VITALS — BP 146/90 | HR 73 | Temp 98.1°F | Wt 227.0 lb

## 2015-07-13 DIAGNOSIS — R109 Unspecified abdominal pain: Secondary | ICD-10-CM

## 2015-07-13 DIAGNOSIS — G4489 Other headache syndrome: Secondary | ICD-10-CM | POA: Diagnosis not present

## 2015-07-13 DIAGNOSIS — R110 Nausea: Secondary | ICD-10-CM | POA: Diagnosis not present

## 2015-07-13 DIAGNOSIS — M2550 Pain in unspecified joint: Secondary | ICD-10-CM | POA: Diagnosis not present

## 2015-07-13 DIAGNOSIS — K5900 Constipation, unspecified: Secondary | ICD-10-CM | POA: Diagnosis not present

## 2015-07-13 DIAGNOSIS — G8929 Other chronic pain: Secondary | ICD-10-CM

## 2015-07-13 DIAGNOSIS — R42 Dizziness and giddiness: Secondary | ICD-10-CM

## 2015-07-13 LAB — CBC WITH DIFFERENTIAL/PLATELET
Basophils Absolute: 0 cells/uL (ref 0–200)
Basophils Relative: 0 %
Eosinophils Absolute: 138 cells/uL (ref 15–500)
Eosinophils Relative: 2 %
HEMATOCRIT: 42.1 % (ref 38.5–50.0)
Hemoglobin: 13.8 g/dL (ref 13.2–17.1)
Lymphocytes Relative: 19 %
Lymphs Abs: 1311 cells/uL (ref 850–3900)
MCH: 30.3 pg (ref 27.0–33.0)
MCHC: 32.8 g/dL (ref 32.0–36.0)
MCV: 92.5 fL (ref 80.0–100.0)
MONO ABS: 414 {cells}/uL (ref 200–950)
MPV: 10.4 fL (ref 7.5–12.5)
Monocytes Relative: 6 %
NEUTROS PCT: 73 %
Neutro Abs: 5037 cells/uL (ref 1500–7800)
Platelets: 208 10*3/uL (ref 140–400)
RBC: 4.55 MIL/uL (ref 4.20–5.80)
RDW: 14.3 % (ref 11.0–15.0)
WBC: 6.9 10*3/uL (ref 4.0–10.5)

## 2015-07-13 LAB — POCT URINALYSIS DIPSTICK
Bilirubin, UA: NEGATIVE
GLUCOSE UA: NEGATIVE
Ketones, UA: NEGATIVE
Leukocytes, UA: NEGATIVE
Nitrite, UA: NEGATIVE
PROTEIN UA: NEGATIVE
RBC UA: NEGATIVE
SPEC GRAV UA: 1.015
UROBILINOGEN UA: NEGATIVE
pH, UA: 7.5

## 2015-07-13 NOTE — Progress Notes (Signed)
Subjective: Chief Complaint  Patient presents with  . No energy    stomach is bothering him, pain in joints, and states that his headache is still there. has not been able to leave apartment becuase of dizziness.    Here for last few days not feeling well, no energy, dizzy, headache, joint pains, stomach hurts.   Hurts in the middle of stomach, lots of gurgling.  Drinks water to help gurgling, but pain return within an hour.   Hurting in both knees, shoulder.   Headache is more in the top of the head.  No sick contacts.    No fever.  +nausea, but no vomiting, after eating about 45 minutes later feels nausea.   Having some constipation, but no diarrhea.   No cough or congestion.  No problems with urination.   No rash.  Has used Ibuprofen 400mg  and Tylenol the last x 2-3 weeks due to knee pains.  No recent travel.   No recent unusual foods or concern for food poisoning.  Has been limiting greasy foods   Has seen urology since last visit here.   Was advised to f/u prn.  Apparently PSA numbers were much improved.  Sees them again next month.  wasn't put on medication for BPH  Last ortho visit 2015.  Still taking Prilosec prescription regularly.  Last BM yesterday.   No other aggravating or relieving factors. No other complaint.  Past Medical History  Diagnosis Date  . Anemia 2009    noted during hospitalization for surgical I&D of chest, arm abscess  . Depression   . 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 GI bleed 2013  . Hypertension   . GERD (gastroesophageal reflux disease)    Past Surgical History  Procedure Laterality Date  . Incise and drain abcess  2009    patient had abscesses and cellulitis of the chest and arm which grew out microfollicular strap.  . Esophagogastroduodenoscopy  05/05/2011    Procedure: ESOPHAGOGASTRODUODENOSCOPY (EGD);  Surgeon: Irene Shipper, MD;  Location: [facility] ENDOSCOPY;  Service: Endoscopy;  Laterality: N/A;  . Gastrectomy       ROS as in  subjective   Objective: BP 146/90 mmHg  Pulse 73  Temp(Src) 98.1 F (36.7 C) (Tympanic)  Wt 227 lb (102.967 kg)  General appearance: alert, no distress, WD/WN HEENT: normocephalic, sclerae anicteric, TMs pearly, nares patent, no discharge or erythema, pharynx normal Oral cavity: MMM, no lesions Neck: supple, no lymphadenopathy, no thyromegaly, no masses Heart: RRR, normal S1, S2, no murmurs Lungs: CTA bilaterally, no wheezes, rhonchi, or rales Abdomen: +bs, soft, vertical upper abdominal surgical scar, small umbilical hernia nontender and reducible, otherwise tender somewhat throughout, but worse RLQ, somewhat + rebound, but given his grimacing, hard to say, non tender, non distended, no masses, no hepatomegaly, no splenomegaly Pulses: 2+ symmetric, upper and lower extremities, normal cap refill Back: nontender Ext: no edema    Assessment: Encounter Diagnoses  Name Primary?  . Chronic abdominal pain Yes  . Dizziness and giddiness   . Nausea without vomiting   . Headache syndrome   . Constipation, unspecified constipation type   . Joint pain       Plan: Discussed his symptoms and exam findings.   Etiology not clear.  He has chronic abdominal pain, had possible appendicitis on CT in 12/2014, but also had urinary tract infection.   Did not have surgery at that time for appendicitis.   His knee and shoulder pain likely are not  related to his other symptoms.   He is taking a lot of NSAIDs of late, has hx/o GI bleed, and certainly the NSAID could be causing some gastritis.   He also gets constipation.    We will check some labs and go from there.   John Keller was seen today for no energy.  Diagnoses and all orders for this visit:  Chronic abdominal pain -     Orthostatic vital signs -     POCT urinalysis dipstick -     CBC with Differential/Platelet -     Comprehensive metabolic panel  Dizziness and giddiness -     Orthostatic vital signs -     CBC with  Differential/Platelet -     Comprehensive metabolic panel  Nausea without vomiting -     Orthostatic vital signs -     POCT urinalysis dipstick -     CBC with Differential/Platelet -     Comprehensive metabolic panel  Headache syndrome -     Orthostatic vital signs -     CBC with Differential/Platelet -     Comprehensive metabolic panel  Constipation, unspecified constipation type -     Orthostatic vital signs -     CBC with Differential/Platelet -     Comprehensive metabolic panel  Joint pain -     Orthostatic vital signs -     CBC with Differential/Platelet -     Comprehensive metabolic panel

## 2015-07-14 LAB — COMPREHENSIVE METABOLIC PANEL
ALT: 8 U/L — ABNORMAL LOW (ref 9–46)
AST: 12 U/L (ref 10–35)
Albumin: 3.6 g/dL (ref 3.6–5.1)
Alkaline Phosphatase: 71 U/L (ref 40–115)
BUN: 15 mg/dL (ref 7–25)
CALCIUM: 8.3 mg/dL — AB (ref 8.6–10.3)
CHLORIDE: 106 mmol/L (ref 98–110)
CO2: 27 mmol/L (ref 20–31)
Creat: 1 mg/dL (ref 0.70–1.33)
GLUCOSE: 60 mg/dL — AB (ref 65–99)
Potassium: 4.2 mmol/L (ref 3.5–5.3)
Sodium: 144 mmol/L (ref 135–146)
Total Bilirubin: 0.7 mg/dL (ref 0.2–1.2)
Total Protein: 6.4 g/dL (ref 6.1–8.1)

## 2015-07-20 ENCOUNTER — Other Ambulatory Visit: Payer: Self-pay | Admitting: Medical

## 2015-07-20 DIAGNOSIS — R1084 Generalized abdominal pain: Secondary | ICD-10-CM

## 2015-07-20 DIAGNOSIS — F840 Autistic disorder: Secondary | ICD-10-CM | POA: Diagnosis not present

## 2015-07-20 DIAGNOSIS — R102 Pelvic and perineal pain unspecified side: Secondary | ICD-10-CM

## 2015-07-26 DIAGNOSIS — G4733 Obstructive sleep apnea (adult) (pediatric): Secondary | ICD-10-CM | POA: Diagnosis not present

## 2015-07-31 ENCOUNTER — Ambulatory Visit
Admission: RE | Admit: 2015-07-31 | Discharge: 2015-07-31 | Disposition: A | Discharge status: Home / Self Care | Payer: BLUE CROSS/BLUE SHIELD | Source: Ambulatory Visit | Attending: Medical | Admitting: Medical

## 2015-07-31 DIAGNOSIS — R1084 Generalized abdominal pain: Secondary | ICD-10-CM

## 2015-07-31 DIAGNOSIS — R102 Pelvic and perineal pain unspecified side: Secondary | ICD-10-CM

## 2015-07-31 DIAGNOSIS — N281 Cyst of kidney, acquired: Secondary | ICD-10-CM | POA: Diagnosis not present

## 2015-07-31 MED ORDER — IOPAMIDOL (ISOVUE-300) INJECTION 61%
125.0000 mL | Freq: Once | INTRAVENOUS | Status: AC | PRN
  Administered 2015-07-31: 125 mL via INTRAVENOUS

## 2015-08-03 DIAGNOSIS — F840 Autistic disorder: Secondary | ICD-10-CM | POA: Diagnosis not present

## 2015-08-04 DIAGNOSIS — F331 Major depressive disorder, recurrent, moderate: Secondary | ICD-10-CM | POA: Diagnosis not present

## 2015-08-04 DIAGNOSIS — G4726 Circadian rhythm sleep disorder, shift work type: Secondary | ICD-10-CM | POA: Diagnosis not present

## 2015-08-05 ENCOUNTER — Ambulatory Visit (INDEPENDENT_AMBULATORY_CARE_PROVIDER_SITE_OTHER): Payer: BLUE CROSS/BLUE SHIELD | Admitting: Medical

## 2015-08-05 ENCOUNTER — Encounter: Payer: Self-pay | Admitting: Medical

## 2015-08-05 VITALS — BP 130/84 | HR 69 | Wt 228.0 lb

## 2015-08-05 DIAGNOSIS — G8929 Other chronic pain: Secondary | ICD-10-CM | POA: Diagnosis not present

## 2015-08-05 DIAGNOSIS — N281 Cyst of kidney, acquired: Secondary | ICD-10-CM

## 2015-08-05 DIAGNOSIS — K4090 Unilateral inguinal hernia, without obstruction or gangrene, not specified as recurrent: Secondary | ICD-10-CM

## 2015-08-05 DIAGNOSIS — Q6100 Congenital renal cyst, unspecified: Secondary | ICD-10-CM

## 2015-08-05 DIAGNOSIS — K429 Umbilical hernia without obstruction or gangrene: Secondary | ICD-10-CM | POA: Diagnosis not present

## 2015-08-05 DIAGNOSIS — K5900 Constipation, unspecified: Secondary | ICD-10-CM

## 2015-08-05 DIAGNOSIS — K5909 Other constipation: Secondary | ICD-10-CM

## 2015-08-05 DIAGNOSIS — R109 Unspecified abdominal pain: Secondary | ICD-10-CM | POA: Diagnosis not present

## 2015-08-05 NOTE — Progress Notes (Signed)
Subjective: Chief Complaint  Patient presents with  . Follow-up    on ct scan. stated he wants to avoid any more hospital visits.    Here for f/u on CT scan and abdominal and pelvic pain.  I saw him on 07/13/15 for RLQ abdomina pain.  He has hx/o the same chronically, sees GI and will be seeing GI within the next 2 weeks.   At last visit he was having malaise, RLQ pain, headache, dizziness, joint pains.   Today he has no symptoms.   He does have hx/o constipation but usually has a daily BM. Sometimes uses Miralax.  Hasn't used other remedies for constipation.   No problems with urination.   No rash.   No other aggravating or relieving factors. No other complaint.  Past Medical History  Diagnosis Date  . Anemia 2009    noted during hospitalization for surgical I&D of chest, arm abscess  . Depression   . 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 GI bleed 2013  . Hypertension   . GERD (gastroesophageal reflux disease)    Past Surgical History  Procedure Laterality Date  . Incise and drain abcess  2009    patient had abscesses and cellulitis of the chest and arm which grew out microfollicular strap.  . Esophagogastroduodenoscopy  05/05/2011    Procedure: ESOPHAGOGASTRODUODENOSCOPY (EGD);  Surgeon: Irene Shipper, MD;  Location: [facility] ENDOSCOPY;  Service: Endoscopy;  Laterality: N/A;  . Gastrectomy       ROS as in subjective   Objective: BP 130/84 mmHg  Pulse 69  Wt 228 lb (103.42 kg)  General appearance: alert, no distress, WD/WN Abdomen: +bs, soft, vertical upper abdominal surgical scar, bulge at the umbilicus and reducible but somewhat tender umbilical hernia , otherwise abdomen non tender, non distended, no masses, no hepatomegaly, no splenomegaly GU: tender over right inguinal region, fullness in same area, reducible right inguinal hernia, otherwise circumcised, no other tenderness, no other abnormality Pulses: 2+ symmetric, upper and lower extremities, normal cap refill Back: non  tender Ext: no edema    Assessment: Encounter Diagnoses  Name Primary?  . Right inguinal hernia Yes  . Chronic abdominal pain   . Umbilical hernia without obstruction and without gangrene   . Renal cyst   . Chronic constipation      Plan: Discussed CT findings, renal cyst likely benign but will explore this further and get back with him.  He does have somewhat symptomatic right inguinal hernia and umbilical hernia.   Offered general surgery consult but he wants to hold off.  He sees GI in the few weeks, will discuss further with them.  C/t good fiber and water intake, miralax OTC prn.   Rikuto was seen today for follow-up.  Diagnoses and all orders for this visit:  Right inguinal hernia  Chronic abdominal pain  Umbilical hernia without obstruction and without gangrene  Renal cyst  Chronic constipation

## 2015-08-10 ENCOUNTER — Telehealth: Payer: Self-pay | Admitting: Medical

## 2015-08-10 NOTE — Telephone Encounter (Signed)
Pt states that he has appt with Dr. McDermitt at [facility] on July 31st. Pt has f/u with GI as well. Will fax note and copy of scans to Dr. McDermitt and GI.  Thanks, RLB

## 2015-08-10 NOTE — Telephone Encounter (Signed)
I called nephrology about renal cyst.

## 2015-08-10 NOTE — Telephone Encounter (Signed)
After looking back over his scan, the kidney cyst is large.   Lets refer to Urology for consult about the renal cyst.   He also had lots of stool in the colon, so I wonder how much of his pains are in fact constipation.     F/u with GI as planned  Make sure we send GI and Urology copies of the CT scan and my recent notes

## 2015-08-17 DIAGNOSIS — F840 Autistic disorder: Secondary | ICD-10-CM | POA: Diagnosis not present

## 2015-08-18 ENCOUNTER — Encounter: Payer: Self-pay | Admitting: Internal Medicine

## 2015-08-18 ENCOUNTER — Ambulatory Visit (INDEPENDENT_AMBULATORY_CARE_PROVIDER_SITE_OTHER): Payer: BLUE CROSS/BLUE SHIELD | Admitting: Internal Medicine

## 2015-08-18 VITALS — BP 142/90 | HR 80 | Ht 68.5 in | Wt 226.4 lb

## 2015-08-18 DIAGNOSIS — K2100 Gastro-esophageal reflux disease with esophagitis, without bleeding: Secondary | ICD-10-CM

## 2015-08-18 DIAGNOSIS — K439 Ventral hernia without obstruction or gangrene: Secondary | ICD-10-CM

## 2015-08-18 DIAGNOSIS — R1084 Generalized abdominal pain: Secondary | ICD-10-CM

## 2015-08-18 DIAGNOSIS — K4090 Unilateral inguinal hernia, without obstruction or gangrene, not specified as recurrent: Secondary | ICD-10-CM

## 2015-08-18 DIAGNOSIS — R194 Change in bowel habit: Secondary | ICD-10-CM

## 2015-08-18 DIAGNOSIS — K589 Irritable bowel syndrome without diarrhea: Secondary | ICD-10-CM

## 2015-08-18 MED ORDER — DICYCLOMINE HCL 20 MG PO TABS: 20.0000 mg | ORAL_TABLET | Freq: Four times a day (QID) | ORAL | Status: DC | PRN

## 2015-08-18 MED ORDER — OMEPRAZOLE 40 MG PO CPDR: 40.0000 mg | DELAYED_RELEASE_CAPSULE | Freq: Every day | ORAL | Status: DC

## 2015-08-18 NOTE — Patient Instructions (Signed)
We have sent the following medications to your pharmacy for you to pick up at your convenience:  Bentyl  As discussed with Dr. Henrene Pastor, resume taking your Omeprazole  Take Metamucil - 2 tablespoons a day mixed in water or juice  If your hernia becomes sore, contact a surgeon   [facility].  [phone])

## 2015-08-18 NOTE — Progress Notes (Signed)
HISTORY OF PRESENT ILLNESS:  John Keller is a 60 y.o. male  with a history of hypertension and depression who is referred today by Mr. Tysinger to evaluate multiple chronic GI complaints. I was introduced to the patient in the hospital March 2013 to evaluate symptomatic melena. Upper endoscopy revealed submucosal mass with central ulceration. 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 He recovered uneventfully and required no further therapy. I saw the patient in July 2014 with complaints of abdominal cramping and bloating. Bowels with a tendency toward constipation though occasional diarrhea. Also minor rectal bleeding. He subsequently underwent colonoscopy and upper endoscopy August 2014. Colonoscopy was normal. Upper endoscopy revealed esophagitis and gastritis in addition to unremarkable postoperative changes. No evidence for Helicobacter pylori. Was placed on PPI therapy. He was seen in follow-up by the GI physician assistant September 2014. He continued with the same complaints. He was felt to have IBS. Seen by his PCP recently for similar complaints. He did undergo CT scan of the abdomen pelvis which revealed ventral hernia as well as right inguinal hernia (07/31/2015) in addition to chronic stable cystic mass of the left kidney. Patient reports to me today constant generalized abdominal discomfort without change. He takes MiraLAX for constipation but cannot tolerate this due to diarrhea. He does mention gas buildup and bloating. Some discomfort with his ventral hernia but not his inguinal hernia. He reports decreased appetite and decreased energy. He does not leave the house in the morning until he has a bowel movement. Sometimes cannot discriminate between gas and feces. Does have some reflux symptoms and is not taking PPI. Has dicyclomine prescribed but not taking this either.  REVIEW OF SYSTEMS:  All non-GI ROS negative except for anxiety, depression, back pain,  fatigue, headaches, muscle cramps, sleeping problems, urinary leakage  Past Medical History  Diagnosis Date  . Anemia 2009    noted during hospitalization for surgical I&D of chest, arm abscess  . Depression   . 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 GI bleed 2013  . Hypertension   . GERD (gastroesophageal reflux disease)   . Appendicitis   . Sleep apnea     Past Surgical History  Procedure Laterality Date  . Incise and drain abcess  2009    patient had abscesses and cellulitis of the chest and arm which grew out microfollicular strap.  . Esophagogastroduodenoscopy  05/05/2011    Procedure: ESOPHAGOGASTRODUODENOSCOPY (EGD);  Surgeon: Irene Shipper, MD;  Location: [facility] ENDOSCOPY;  Service: Endoscopy;  Laterality: N/A;  . Gastrectomy      Social History ELSIE REE  reports that he has never smoked. He has never used smokeless tobacco. He reports that he does not drink alcohol or use illicit drugs.  family history includes Aneurysm in his father; Diabetes in his mother; Stroke in his mother. There is no history of Anesthesia problems, Hypotension, Malignant hyperthermia, or Pseudochol deficiency.  No Known Allergies     PHYSICAL EXAMINATION: Vital signs: BP 142/90 mmHg  Pulse 80  Ht 5' 8.5" (1.74 m)  Wt 226 lb 6 oz (102.683 kg)  BMI 33.92 kg/m2  Constitutional: generally well-appearing, no acute distress Psychiatric: alert and oriented x3, cooperative Eyes: extraocular movements intact, anicteric, conjunctiva pink Mouth: oral pharynx moist, no lesions Neck: supple no lymphadenopathy Cardiovascular: heart regular rate and rhythm, no murmur Lungs: clear to auscultation bilaterally Abdomen: soft, nontender, nondistended, no obvious ascites, no peritoneal signs, normal bowel sounds, no organomegaly. Ventral  hernia which is nontender. Abdominal contents easily reduced. Rectal:Omitted. Nontender right inguinal hernia Extremities: no clubbing cyanosis or lower extremity edema  bilaterally Skin: no lesions on visible extremities Neuro: No focal deficits. Cranial nerves intact  ASSESSMENT:  #1. Chronic abdominal complaints consistent with IBS #2. Ventral hernia. Minimal symptomatic #3. Right inguinal hernia. Asymptomatic #4. History of GIST tumor status post resection 2013 #5. Normal colonoscopy 2014 #6. GERD with esophagitis on EGD 2014  PLAN:  #1. Recommend Metamucil 2 tablespoons daily for alternating bowel habits #2. Prescribe dicyclomine 20 mg every 6 hours as needed for abdominal discomfort #3. Reflux precautions #4. Resume omeprazole for GERD #5. Discussed the need to see general surgeon for symptomatic ventral hernia or if inguinal hernia becomes symptomatic. He understood  A copy of this consultation note has been sent to [employer] PA-C

## 2015-08-26 DIAGNOSIS — G4733 Obstructive sleep apnea (adult) (pediatric): Secondary | ICD-10-CM | POA: Diagnosis not present

## 2015-08-31 ENCOUNTER — Encounter (HOSPITAL_COMMUNITY): Payer: Self-pay | Admitting: Emergency Medicine

## 2015-08-31 ENCOUNTER — Ambulatory Visit (HOSPITAL_COMMUNITY)
Admission: EM | Admit: 2015-08-31 | Discharge: 2015-08-31 | Disposition: A | Discharge status: Home / Self Care | Payer: BLUE CROSS/BLUE SHIELD | Attending: Physician Assistant | Admitting: Physician Assistant

## 2015-08-31 DIAGNOSIS — R0989 Other specified symptoms and signs involving the circulatory and respiratory systems: Secondary | ICD-10-CM

## 2015-08-31 DIAGNOSIS — F840 Autistic disorder: Secondary | ICD-10-CM | POA: Diagnosis not present

## 2015-08-31 MED ORDER — ALBUTEROL SULFATE HFA 108 (90 BASE) MCG/ACT IN AERS: 2.0000 | INHALATION_SPRAY | RESPIRATORY_TRACT | Status: DC | PRN

## 2015-08-31 MED ORDER — ALBUTEROL SULFATE (2.5 MG/3ML) 0.083% IN NEBU
INHALATION_SOLUTION | RESPIRATORY_TRACT | Status: AC
  Filled 2015-08-31: qty 3

## 2015-08-31 MED ORDER — ALBUTEROL SULFATE (2.5 MG/3ML) 0.083% IN NEBU
2.5000 mg | INHALATION_SOLUTION | Freq: Once | RESPIRATORY_TRACT | Status: AC
  Administered 2015-08-31: 2.5 mg via RESPIRATORY_TRACT

## 2015-08-31 NOTE — ED Notes (Signed)
The patient presented to the [facility] with a complaint of chest congestion and fatigue for 2 to 3 weeks. The patient stated that he uses a CPAP machine at night and feels that it is contributing to his congestion.

## 2015-08-31 NOTE — ED Provider Notes (Signed)
CSN: [id number]     Arrival date & time 08/31/15  1027 History   None    Chief Complaint  Patient presents with  . Fatigue   (Consider location/radiation/quality/duration/timing/severity/associated sxs/prior Treatment) HPI History obtained from patient: Location:  resp Context/Duration: 1 week  Severity: 0  Quality:congestion,  Timing:           constant Home Treatment: none Associated symptoms:  coughing Family History:DM-mother    Past Medical History  Diagnosis Date  . Anemia 2009    noted during hospitalization for surgical I&D of chest, arm abscess  . Depression   . 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 GI bleed 2013  . Hypertension   . GERD (gastroesophageal reflux disease)   . Appendicitis   . Sleep apnea    Past Surgical History  Procedure Laterality Date  . Incise and drain abcess  2009    patient had abscesses and cellulitis of the chest and arm which grew out microfollicular strap.  . Esophagogastroduodenoscopy  05/05/2011    Procedure: ESOPHAGOGASTRODUODENOSCOPY (EGD);  Surgeon: Irene Shipper, MD;  Location: [facility] ENDOSCOPY;  Service: Endoscopy;  Laterality: N/A;  . Gastrectomy     Family History  Problem Relation Age of Onset  . Anesthesia problems Neg Hx   . Hypotension Neg Hx   . Malignant hyperthermia Neg Hx   . Pseudochol deficiency Neg Hx   . Diabetes Mother   . Stroke Mother   . Aneurysm Father     died of brain aneurysm   Social History  Substance Use Topics  . Smoking status: Never Smoker   . Smokeless tobacco: Never Used  . Alcohol Use: No    Review of Systems  Denies: HEADACHE, NAUSEA, ABDOMINAL PAIN, CHEST PAIN,  DYSURIA, SHORTNESS OF BREATH  Allergies  Review of patient's allergies indicates no known allergies.  Home Medications   Prior to Admission medications   Medication Sig Start Date End Date Taking? Authorizing Provider  Armodafinil 150 MG tablet Take 1 tablet by mouth daily. 07/30/15  Yes Historical Provider, MD  desvenlafaxine  (PRISTIQ) 100 MG 24 hr tablet Take 1 tablet by mouth daily. 08/04/15  Yes Historical Provider, MD  metoprolol tartrate (LOPRESSOR) 25 MG tablet Take 1 tablet (25 mg total) by mouth 2 (two) times daily. 04/27/15  Yes Camelia Eng Tysinger, PA-C  omeprazole (PRILOSEC) 40 MG capsule Take 1 capsule (40 mg total) by mouth daily. 08/18/15  Yes Irene Shipper, MD  polyethylene glycol powder (GLYCOLAX/MIRALAX) powder Take 17 g by mouth once. Reported on 08/18/2015   Yes Historical Provider, MD  tamsulosin (FLOMAX) 0.4 MG CAPS capsule Take 1 capsule by mouth daily. 07/15/15  Yes Historical Provider, MD  acetaminophen (TYLENOL) 500 MG tablet Take 1,000 mg by mouth every 6 (six) hours as needed (pain). Reported on 08/05/2015    Historical Provider, MD  AMBULATORY NON FORMULARY MEDICATION CPAP    Historical Provider, MD  Armodafinil (NUVIGIL PO) Take by mouth. Reported on 04/27/2015    Historical Provider, MD  Ascorbic Acid (VITAMIN C) 100 MG tablet Take 100 mg by mouth daily.    Historical Provider, MD  cholecalciferol (VITAMIN D) 1000 units tablet Take 1,000 Units by mouth daily.    Historical Provider, MD  dicyclomine (BENTYL) 20 MG tablet Take 1 tablet (20 mg total) by mouth every 6 (six) hours as needed for spasms. 08/18/15   Irene Shipper, MD  Multiple Vitamin (MULITIVITAMIN WITH MINERALS) TABS Take 1 tablet by mouth  daily.    Historical Provider, MD  omeprazole (PRILOSEC) 40 MG capsule Take 1 capsule (40 mg total) by mouth daily. 01/12/15   Irene Shipper, MD  vitamin B-12 (CYANOCOBALAMIN) 1000 MCG tablet Take 1,000 mcg by mouth daily.    Historical Provider, MD   Meds Ordered and Administered this Visit   Medications  albuterol (PROVENTIL) (2.5 MG/3ML) 0.083% nebulizer solution 2.5 mg (not administered)    BP 148/85 mmHg  Pulse 71  Temp(Src) 98.1 F (36.7 C) (Oral)  Resp 18  SpO2 97% No data found.   Physical Exam NURSES NOTES AND VITAL SIGNS REVIEWED. CONSTITUTIONAL: Well developed, well nourished, no acute  distress HEENT: normocephalic, atraumatic EYES: Conjunctiva normal NECK:normal ROM, supple, no adenopathy PULMONARY:No respiratory distress, normal effort, diffuse rhonchi, few wheezes, no consolidation ABDOMINAL: Soft, ND, NT BS+, No CVAT MUSCULOSKELETAL: Normal ROM of all extremities,  SKIN: warm and dry without rash PSYCHIATRIC: Mood and affect, behavior are normal   ED Course  Procedures (including critical care time)  Labs Review Labs Reviewed - No data to display  Imaging Review No results found.   Visual Acuity Review  Right Eye Distance:   Left Eye Distance:   Bilateral Distance:    Right Eye Near:   Left Eye Near:    Bilateral Near:       Pt states that he is feeling much better after neb tx.  Will talk with cpap provider this afternoon.  RX for albuterol.   MDM   1. Chest congestion     Patient is reassured that there are no issues that require transfer to higher level of care at this time or additional tests. Patient is advised to continue home symptomatic treatment. Patient is advised that if there are new or worsening symptoms to attend the emergency department, contact primary care provider, or return to UC. Instructions of care provided discharged home in stable condition.    THIS NOTE WAS GENERATED USING A VOICE RECOGNITION SOFTWARE PROGRAM. ALL REASONABLE EFFORTS  WERE MADE TO PROOFREAD THIS DOCUMENT FOR ACCURACY.  I have verbally reviewed the discharge instructions with the patient. A printed AVS was given to the patient.  All questions were answered prior to discharge.      Konrad Felix, PA 08/31/15 1213

## 2015-08-31 NOTE — Discharge Instructions (Signed)

## 2015-09-14 DIAGNOSIS — F840 Autistic disorder: Secondary | ICD-10-CM | POA: Diagnosis not present

## 2015-09-25 DIAGNOSIS — G4733 Obstructive sleep apnea (adult) (pediatric): Secondary | ICD-10-CM | POA: Diagnosis not present

## 2015-09-28 DIAGNOSIS — R972 Elevated prostate specific antigen [PSA]: Secondary | ICD-10-CM | POA: Diagnosis not present

## 2015-09-28 DIAGNOSIS — F840 Autistic disorder: Secondary | ICD-10-CM | POA: Diagnosis not present

## 2015-09-28 DIAGNOSIS — R350 Frequency of micturition: Secondary | ICD-10-CM | POA: Diagnosis not present

## 2015-10-12 DIAGNOSIS — F840 Autistic disorder: Secondary | ICD-10-CM | POA: Diagnosis not present

## 2015-10-26 DIAGNOSIS — F840 Autistic disorder: Secondary | ICD-10-CM | POA: Diagnosis not present

## 2015-10-26 DIAGNOSIS — G4733 Obstructive sleep apnea (adult) (pediatric): Secondary | ICD-10-CM | POA: Diagnosis not present

## 2015-11-03 DIAGNOSIS — F331 Major depressive disorder, recurrent, moderate: Secondary | ICD-10-CM | POA: Diagnosis not present

## 2015-11-03 DIAGNOSIS — G4726 Circadian rhythm sleep disorder, shift work type: Secondary | ICD-10-CM | POA: Diagnosis not present

## 2015-11-17 DIAGNOSIS — F840 Autistic disorder: Secondary | ICD-10-CM | POA: Diagnosis not present

## 2015-11-26 DIAGNOSIS — G4733 Obstructive sleep apnea (adult) (pediatric): Secondary | ICD-10-CM | POA: Diagnosis not present

## 2015-11-30 DIAGNOSIS — F840 Autistic disorder: Secondary | ICD-10-CM | POA: Diagnosis not present

## 2015-12-14 DIAGNOSIS — F840 Autistic disorder: Secondary | ICD-10-CM | POA: Diagnosis not present

## 2015-12-26 DIAGNOSIS — G4733 Obstructive sleep apnea (adult) (pediatric): Secondary | ICD-10-CM | POA: Diagnosis not present

## 2015-12-28 DIAGNOSIS — F840 Autistic disorder: Secondary | ICD-10-CM | POA: Diagnosis not present

## 2016-01-11 DIAGNOSIS — F840 Autistic disorder: Secondary | ICD-10-CM | POA: Diagnosis not present

## 2016-01-25 DIAGNOSIS — F840 Autistic disorder: Secondary | ICD-10-CM | POA: Diagnosis not present

## 2016-01-26 DIAGNOSIS — G4733 Obstructive sleep apnea (adult) (pediatric): Secondary | ICD-10-CM | POA: Diagnosis not present

## 2016-02-15 DIAGNOSIS — F840 Autistic disorder: Secondary | ICD-10-CM | POA: Diagnosis not present

## 2016-02-25 DIAGNOSIS — G4733 Obstructive sleep apnea (adult) (pediatric): Secondary | ICD-10-CM | POA: Diagnosis not present

## 2016-03-07 DIAGNOSIS — F840 Autistic disorder: Secondary | ICD-10-CM | POA: Diagnosis not present

## 2016-03-21 DIAGNOSIS — F840 Autistic disorder: Secondary | ICD-10-CM | POA: Diagnosis not present

## 2016-04-04 DIAGNOSIS — F840 Autistic disorder: Secondary | ICD-10-CM | POA: Diagnosis not present

## 2016-04-18 DIAGNOSIS — F840 Autistic disorder: Secondary | ICD-10-CM | POA: Diagnosis not present

## 2016-04-25 ENCOUNTER — Encounter: Payer: Self-pay | Admitting: Medical

## 2016-04-25 ENCOUNTER — Ambulatory Visit (INDEPENDENT_AMBULATORY_CARE_PROVIDER_SITE_OTHER): Payer: BLUE CROSS/BLUE SHIELD | Admitting: Medical

## 2016-04-25 DIAGNOSIS — M25561 Pain in right knee: Secondary | ICD-10-CM | POA: Insufficient documentation

## 2016-04-25 DIAGNOSIS — M7989 Other specified soft tissue disorders: Secondary | ICD-10-CM

## 2016-04-25 DIAGNOSIS — M545 Low back pain, unspecified: Secondary | ICD-10-CM | POA: Insufficient documentation

## 2016-04-25 DIAGNOSIS — M6289 Other specified disorders of muscle: Secondary | ICD-10-CM | POA: Diagnosis not present

## 2016-04-25 DIAGNOSIS — M79602 Pain in left arm: Secondary | ICD-10-CM | POA: Insufficient documentation

## 2016-04-25 DIAGNOSIS — M79601 Pain in right arm: Secondary | ICD-10-CM

## 2016-04-25 DIAGNOSIS — M79604 Pain in right leg: Secondary | ICD-10-CM

## 2016-04-25 DIAGNOSIS — M549 Dorsalgia, unspecified: Secondary | ICD-10-CM | POA: Diagnosis not present

## 2016-04-25 DIAGNOSIS — M62838 Other muscle spasm: Secondary | ICD-10-CM | POA: Insufficient documentation

## 2016-04-25 MED ORDER — DICLOFENAC SODIUM 75 MG PO TBEC: 75.0000 mg | DELAYED_RELEASE_TABLET | Freq: Two times a day (BID) | ORAL | 0 refills | Status: DC

## 2016-04-25 MED ORDER — CYCLOBENZAPRINE HCL 10 MG PO TABS: 10.0000 mg | ORAL_TABLET | Freq: Every day | ORAL | 0 refills | Status: DC
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
## 2016-04-26 ENCOUNTER — Ambulatory Visit
Admission: RE | Admit: 2016-04-26 | Discharge: 2016-04-26 | Disposition: A | Discharge status: Home / Self Care | Payer: BLUE CROSS/BLUE SHIELD | Source: Ambulatory Visit | Attending: Medical | Admitting: Medical

## 2016-04-26 DIAGNOSIS — R600 Localized edema: Secondary | ICD-10-CM | POA: Diagnosis not present

## 2016-04-26 DIAGNOSIS — M7989 Other specified soft tissue disorders: Secondary | ICD-10-CM

## 2016-04-26 DIAGNOSIS — M79604 Pain in right leg: Secondary | ICD-10-CM

## 2016-04-29 DIAGNOSIS — M25561 Pain in right knee: Secondary | ICD-10-CM | POA: Diagnosis not present

## 2016-04-29 DIAGNOSIS — M545 Low back pain: Secondary | ICD-10-CM | POA: Diagnosis not present

## 2016-05-02 ENCOUNTER — Telehealth: Payer: Self-pay | Admitting: Medical

## 2016-05-02 DIAGNOSIS — F840 Autistic disorder: Secondary | ICD-10-CM | POA: Diagnosis not present

## 2016-05-02 NOTE — Telephone Encounter (Signed)
Pt called and stated Voltaren is not working. He states he has frequent urination/thrist/constipation and throwing up. Pt uses [facility] [employer] and can be reached at [phone].
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
## 2016-05-03 ENCOUNTER — Encounter: Payer: Self-pay | Admitting: Medical

## 2016-05-03 ENCOUNTER — Ambulatory Visit (INDEPENDENT_AMBULATORY_CARE_PROVIDER_SITE_OTHER): Payer: BLUE CROSS/BLUE SHIELD | Admitting: Medical

## 2016-05-03 VITALS — BP 168/90 | HR 65 | Temp 99.1°F | Wt 237.6 lb

## 2016-05-03 DIAGNOSIS — R350 Frequency of micturition: Secondary | ICD-10-CM | POA: Diagnosis not present

## 2016-05-03 DIAGNOSIS — R0981 Nasal congestion: Secondary | ICD-10-CM | POA: Diagnosis not present

## 2016-05-03 DIAGNOSIS — R112 Nausea with vomiting, unspecified: Secondary | ICD-10-CM

## 2016-05-03 DIAGNOSIS — Z87440 Personal history of urinary (tract) infections: Secondary | ICD-10-CM | POA: Diagnosis not present

## 2016-05-03 DIAGNOSIS — M25561 Pain in right knee: Secondary | ICD-10-CM | POA: Diagnosis not present

## 2016-05-03 DIAGNOSIS — R6883 Chills (without fever): Secondary | ICD-10-CM | POA: Diagnosis not present

## 2016-05-03 DIAGNOSIS — R509 Fever, unspecified: Secondary | ICD-10-CM | POA: Diagnosis not present

## 2016-05-03 DIAGNOSIS — M545 Low back pain: Secondary | ICD-10-CM | POA: Diagnosis not present

## 2016-05-03 LAB — CBC WITH DIFFERENTIAL/PLATELET
BASOS PCT: 0 %
Basophils Absolute: 0 cells/uL (ref 0–200)
EOS PCT: 0 %
Eosinophils Absolute: 0 cells/uL — ABNORMAL LOW (ref 15–500)
HCT: 45.3 % (ref 38.5–50.0)
HEMOGLOBIN: 14.8 g/dL (ref 13.2–17.1)
LYMPHS ABS: 1210 {cells}/uL (ref 850–3900)
Lymphocytes Relative: 10 %
MCH: 29.3 pg (ref 27.0–33.0)
MCHC: 32.7 g/dL (ref 32.0–36.0)
MCV: 89.7 fL (ref 80.0–100.0)
MPV: 10 fL (ref 7.5–12.5)
Monocytes Absolute: 847 cells/uL (ref 200–950)
Monocytes Relative: 7 %
NEUTROS PCT: 83 %
Neutro Abs: 10043 cells/uL — ABNORMAL HIGH (ref 1500–7800)
Platelets: 222 10*3/uL (ref 140–400)
RBC: 5.05 MIL/uL (ref 4.20–5.80)
RDW: 14.8 % (ref 11.0–15.0)
WBC: 12.1 10*3/uL — AB (ref 4.0–10.5)

## 2016-05-03 LAB — POCT URINALYSIS DIPSTICK
Bilirubin, UA: NEGATIVE
Blood, UA: NEGATIVE
Glucose, UA: NEGATIVE
LEUKOCYTES UA: NEGATIVE
NITRITE UA: NEGATIVE
Spec Grav, UA: >=1.03
Urobilinogen, UA: 1
pH, UA: 6

## 2016-05-03 LAB — BASIC METABOLIC PANEL
BUN: 23 mg/dL (ref 7–25)
CO2: 27 mmol/L (ref 20–31)
Calcium: 8.8 mg/dL (ref 8.6–10.3)
Chloride: 104 mmol/L (ref 98–110)
Creat: 1.17 mg/dL (ref 0.70–1.33)
GLUCOSE: 88 mg/dL (ref 65–99)
POTASSIUM: 3.8 mmol/L (ref 3.5–5.3)
SODIUM: 140 mmol/L (ref 135–146)

## 2016-05-03 LAB — HEPATIC FUNCTION PANEL
ALBUMIN: 3.8 g/dL (ref 3.6–5.1)
ALK PHOS: 81 U/L (ref 40–115)
ALT: 9 U/L (ref 9–46)
AST: 15 U/L (ref 10–35)
BILIRUBIN TOTAL: 1.3 mg/dL — AB (ref 0.2–1.2)
Bilirubin, Direct: 0.3 mg/dL — ABNORMAL HIGH (ref <=0.2)
Indirect Bilirubin: 1 mg/dL (ref 0.2–1.2)
Total Protein: 7 g/dL (ref 6.1–8.1)

## 2016-05-03 LAB — POC INFLUENZA A&B (BINAX/QUICKVUE)
INFLUENZA B, POC: NEGATIVE
Influenza A, POC: NEGATIVE

## 2016-05-03 LAB — PSA: PSA: 2.2 ng/mL (ref <=4.0)

## 2016-05-03 MED ORDER — CIPROFLOXACIN HCL 500 MG PO TABS: 500.0000 mg | ORAL_TABLET | Freq: Two times a day (BID) | ORAL | 0 refills | Status: DC

## 2016-05-03 MED ORDER — PROMETHAZINE HCL 25 MG PO TABS
ORAL_TABLET | ORAL | 0 refills | Status: DC
Start: 2016-05-03 — End: 2016-06-13

## 2016-05-03 NOTE — Telephone Encounter (Signed)
Pt is still having issues so he is scheduled for today as a follow-up since no better. Pt is not taking flexeril only voltaren. Pt was advised to space out tylenol from voltaren

## 2016-05-03 NOTE — Progress Notes (Signed)
Subjective: Chief Complaint  Patient presents with  . stomach pain    stomach pain and cramping, nausea,urine freq.    He reports nausea, vomiting. Last vomited Sunday 2 days ago.  Nausea yesterday but didn't vomit.   Took miralax Monday, was able to have BM today that took some time to get out.  Was a solid BM today.  Was normal amount of stool today.  Last BM prior to today was 3 days ago.   Has felt some chills.  Has had some head congestion.   No cough, no sore throat, no ear pain.   No pain with urination but last week about 4 days ago had urinary frequency, some urgency.   No blood in urine no odor in urine.  No testicular pain or swelling, no rectal pain or swelling.   No penile pain or discharge.   Not sure about sick contacts.   No recent sick contacts with stomach flu.  He has UTI in the past.   hasn't drank anything today.   Using nothing for vomiting  No other aggravating or relieving factors. No other complaint.  Past Medical History:  Diagnosis Date  . Anemia 2009   noted during hospitalization for surgical I&D of chest, arm abscess  . Appendicitis   . Depression   . Gastric tumor 2013   resection  . GERD (gastroesophageal reflux disease)   . GI bleed 2013  . Hypertension   . Sleep apnea    Current Outpatient Prescriptions on File Prior to Visit  Medication Sig Dispense Refill  . acetaminophen (TYLENOL) 500 MG tablet Take 1,000 mg by mouth every 6 (six) hours as needed (pain). Reported on 08/05/2015    . albuterol (PROVENTIL HFA;VENTOLIN HFA) 108 (90 Base) MCG/ACT inhaler Inhale 2 puffs into the lungs every 4 (four) hours as needed for wheezing or shortness of breath. 1 Inhaler 1  . Ascorbic Acid (VITAMIN C) 100 MG tablet Take 100 mg by mouth daily.    . cholecalciferol (VITAMIN D) 1000 units tablet Take 1,000 Units by mouth daily.    . diclofenac (VOLTAREN) 75 MG EC tablet Take 1 tablet (75 mg total) by mouth 2 (two) times daily. 30 tablet 0  . metoprolol tartrate (LOPRESSOR)  25 MG tablet Take 1 tablet (25 mg total) by mouth 2 (two) times daily. 60 tablet 3  . Multiple Vitamin (MULITIVITAMIN WITH MINERALS) TABS Take 1 tablet by mouth daily.    Marland Kitchen omeprazole (PRILOSEC) 40 MG capsule Take 1 capsule (40 mg total) by mouth daily. 30 capsule 3  . polyethylene glycol powder (GLYCOLAX/MIRALAX) powder Take 17 g by mouth once. Reported on 08/18/2015    . tamsulosin (FLOMAX) 0.4 MG CAPS capsule Take 1 capsule by mouth daily.  1  . vitamin B-12 (CYANOCOBALAMIN) 1000 MCG tablet Take 1,000 mcg by mouth daily.    . cyclobenzaprine (FLEXERIL) 10 MG tablet Take 1 tablet (10 mg total) by mouth at bedtime. (Patient not taking: Reported on 05/03/2016) 10 tablet 0   No current facility-administered medications on file prior to visit.    Past Surgical History:  Procedure Laterality Date  . ESOPHAGOGASTRODUODENOSCOPY  05/05/2011   Procedure: ESOPHAGOGASTRODUODENOSCOPY (EGD);  Surgeon: Irene Shipper, MD;  Location: [facility] ENDOSCOPY;  Service: Endoscopy;  Laterality: N/A;  . GASTRECTOMY    . INCISE AND DRAIN ABCESS  2009   patient had abscesses and cellulitis of the chest and arm which grew out microfollicular strap.   ROS as in subjective  Objective: BP (!) 168/90   Pulse 65   Temp 99.1 F (37.3 C)   Wt 237 lb 9.6 oz (107.8 kg)   SpO2 97%   BMI 35.60 kg/m   Wt Readings from Last 3 Encounters:  05/03/16 237 lb 9.6 oz (107.8 kg)  04/25/16 243 lb 9.6 oz (110.5 kg)  08/18/15 226 lb 6 oz (102.7 kg)   BP Readings from Last 3 Encounters:  05/03/16 (!) 168/90  04/25/16 130/90  08/31/15 148/85   General appearance: alert, no distress, WD/WN,  HEENT: normocephalic, sclerae anicteric, TMs pearly, nares patent, no discharge or erythema, pharynx normal Oral cavity: MMM, no lesions Neck: supple, no lymphadenopathy, no thyromegaly, no masses Heart: RRR, normal S1, S2, no murmurs Lungs: CTA bilaterally, no wheezes, rhonchi, or rales Abdomen: +bs, soft, generally tender, worse left mid  abdomen, otherwise non distended, no masses, no hepatomegaly, no splenomegaly Pulses: 2+ symmetric, upper and lower extremities, normal cap refill Ext: no edema GU/rectal - normal male, nontender, no mass, no organomegaly or hernia, anus nontender, prostate seems boggy and tender on exam  Assessment: Encounter Diagnoses  Name Primary?  . Urine frequency Yes  . Nausea and vomiting, intractability of vomiting not specified, unspecified vomiting type   . Head congestion   . Chills   . Fever, unspecified fever cause   . History of urinary infection     Plan: Etiology unclear.  He has some urinary, GI and respiratory symptoms, unable to keep anything down, and he is hard to read in terms of symptoms and exam.    He does have prior visit with similar symptoms that ended up begin prostaitis.   He also has hx/o UTI.  Begin promethazine for nausea, vomiting, need to push clear fluids throughout the day as discussed.   Limit solid foods until labs are back.   UA and flu test reviewed.   Tarif was seen today for stomach pain.  Diagnoses and all orders for this visit:  Urine frequency -     Urinalysis Dipstick -     CBC with Differential/Platelet -     Basic metabolic panel -     Hepatic function panel -     PSA -     Urine culture  Nausea and vomiting, intractability of vomiting not specified, unspecified vomiting type -     CBC with Differential/Platelet -     Basic metabolic panel -     Hepatic function panel -     PSA -     Urine culture  Head congestion -     CBC with Differential/Platelet -     Basic metabolic panel -     Hepatic function panel -     PSA -     Urine culture  Chills -     CBC with Differential/Platelet -     Basic metabolic panel -     Hepatic function panel -     PSA -     Urine culture  Fever, unspecified fever cause -     CBC with Differential/Platelet -     Basic metabolic panel -     Hepatic function panel -     PSA -     Urine  culture  History of urinary infection -     CBC with Differential/Platelet -     Basic metabolic panel -     Hepatic function panel -     PSA -     Urine culture  Other orders -     promethazine (PHENERGAN) 25 MG tablet; 1/2-1 tablet po q6 hrs for nausea/vomiting

## 2016-05-05 DIAGNOSIS — M25561 Pain in right knee: Secondary | ICD-10-CM | POA: Diagnosis not present

## 2016-05-05 DIAGNOSIS — M545 Low back pain: Secondary | ICD-10-CM | POA: Diagnosis not present

## 2016-05-05 LAB — URINE CULTURE: ORGANISM ID, BACTERIA: NO GROWTH

## 2016-05-09 DIAGNOSIS — F840 Autistic disorder: Secondary | ICD-10-CM | POA: Diagnosis not present

## 2016-05-10 DIAGNOSIS — M25561 Pain in right knee: Secondary | ICD-10-CM | POA: Diagnosis not present

## 2016-05-10 DIAGNOSIS — M545 Low back pain: Secondary | ICD-10-CM | POA: Diagnosis not present

## 2016-05-11 ENCOUNTER — Ambulatory Visit
Admission: RE | Admit: 2016-05-11 | Discharge: 2016-05-11 | Disposition: A | Discharge status: Home / Self Care | Payer: BLUE CROSS/BLUE SHIELD | Source: Ambulatory Visit | Attending: Medical | Admitting: Medical

## 2016-05-11 ENCOUNTER — Encounter: Payer: Self-pay | Admitting: Medical

## 2016-05-11 ENCOUNTER — Ambulatory Visit (INDEPENDENT_AMBULATORY_CARE_PROVIDER_SITE_OTHER): Payer: BLUE CROSS/BLUE SHIELD | Admitting: Medical

## 2016-05-11 VITALS — BP 132/90 | HR 74 | Wt 233.4 lb

## 2016-05-11 DIAGNOSIS — K5900 Constipation, unspecified: Secondary | ICD-10-CM

## 2016-05-11 DIAGNOSIS — R1084 Generalized abdominal pain: Secondary | ICD-10-CM | POA: Diagnosis not present

## 2016-05-11 DIAGNOSIS — R112 Nausea with vomiting, unspecified: Secondary | ICD-10-CM

## 2016-05-11 DIAGNOSIS — K219 Gastro-esophageal reflux disease without esophagitis: Secondary | ICD-10-CM

## 2016-05-11 DIAGNOSIS — R935 Abnormal findings on diagnostic imaging of other abdominal regions, including retroperitoneum: Secondary | ICD-10-CM | POA: Diagnosis not present

## 2016-05-11 MED ORDER — LINACLOTIDE 72 MCG PO CAPS: 72.0000 ug | ORAL_CAPSULE | Freq: Every day | ORAL | 0 refills | Status: DC
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
## 2016-05-11 NOTE — Patient Instructions (Signed)
Go for abdominal Xray today  If this is normal, then I will have you begin once daily sample of Linzess to help with constipation.    Once we begin this we will stop Miralax  Continue to eat a variety of grains and good water intake daily  Continue Omeprazole 40mg  daily for reflux  We will call with xray results

## 2016-05-12 DIAGNOSIS — M25561 Pain in right knee: Secondary | ICD-10-CM | POA: Diagnosis not present

## 2016-05-12 DIAGNOSIS — M545 Low back pain: Secondary | ICD-10-CM | POA: Diagnosis not present

## 2016-05-16 ENCOUNTER — Ambulatory Visit: Payer: BLUE CROSS/BLUE SHIELD | Admitting: Medical

## 2016-05-17 DIAGNOSIS — M25561 Pain in right knee: Secondary | ICD-10-CM | POA: Diagnosis not present

## 2016-05-17 DIAGNOSIS — M545 Low back pain: Secondary | ICD-10-CM | POA: Diagnosis not present

## 2016-05-20 DIAGNOSIS — M545 Low back pain: Secondary | ICD-10-CM | POA: Diagnosis not present

## 2016-05-20 DIAGNOSIS — M25561 Pain in right knee: Secondary | ICD-10-CM | POA: Diagnosis not present

## 2016-05-23 DIAGNOSIS — F840 Autistic disorder: Secondary | ICD-10-CM | POA: Diagnosis not present

## 2016-05-24 DIAGNOSIS — M25561 Pain in right knee: Secondary | ICD-10-CM | POA: Diagnosis not present

## 2016-05-24 DIAGNOSIS — M545 Low back pain: Secondary | ICD-10-CM | POA: Diagnosis not present

## 2016-05-26 DIAGNOSIS — M545 Low back pain: Secondary | ICD-10-CM | POA: Diagnosis not present

## 2016-05-26 DIAGNOSIS — M25561 Pain in right knee: Secondary | ICD-10-CM | POA: Diagnosis not present

## 2016-05-31 DIAGNOSIS — M25561 Pain in right knee: Secondary | ICD-10-CM | POA: Diagnosis not present

## 2016-05-31 DIAGNOSIS — M545 Low back pain: Secondary | ICD-10-CM | POA: Diagnosis not present

## 2016-06-02 DIAGNOSIS — M545 Low back pain: Secondary | ICD-10-CM | POA: Diagnosis not present

## 2016-06-02 DIAGNOSIS — M25561 Pain in right knee: Secondary | ICD-10-CM | POA: Diagnosis not present

## 2016-06-06 DIAGNOSIS — F840 Autistic disorder: Secondary | ICD-10-CM | POA: Diagnosis not present

## 2016-06-07 DIAGNOSIS — M545 Low back pain: Secondary | ICD-10-CM | POA: Diagnosis not present

## 2016-06-07 DIAGNOSIS — M25561 Pain in right knee: Secondary | ICD-10-CM | POA: Diagnosis not present

## 2016-06-09 DIAGNOSIS — M25561 Pain in right knee: Secondary | ICD-10-CM | POA: Diagnosis not present

## 2016-06-09 DIAGNOSIS — M545 Low back pain: Secondary | ICD-10-CM | POA: Diagnosis not present

## 2016-06-13 ENCOUNTER — Ambulatory Visit (INDEPENDENT_AMBULATORY_CARE_PROVIDER_SITE_OTHER): Payer: BLUE CROSS/BLUE SHIELD | Admitting: Medical

## 2016-06-13 ENCOUNTER — Encounter: Payer: Self-pay | Admitting: Medical

## 2016-06-13 VITALS — BP 152/100 | HR 73 | Ht 69.0 in | Wt 235.0 lb

## 2016-06-13 DIAGNOSIS — M79601 Pain in right arm: Secondary | ICD-10-CM | POA: Diagnosis not present

## 2016-06-13 DIAGNOSIS — M545 Low back pain, unspecified: Secondary | ICD-10-CM

## 2016-06-13 DIAGNOSIS — M79641 Pain in right hand: Secondary | ICD-10-CM

## 2016-06-13 DIAGNOSIS — M25561 Pain in right knee: Secondary | ICD-10-CM | POA: Diagnosis not present

## 2016-06-13 DIAGNOSIS — M25562 Pain in left knee: Secondary | ICD-10-CM | POA: Diagnosis not present

## 2016-06-13 DIAGNOSIS — M549 Dorsalgia, unspecified: Secondary | ICD-10-CM | POA: Diagnosis not present
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
## 2016-06-14 DIAGNOSIS — M545 Low back pain: Secondary | ICD-10-CM | POA: Diagnosis not present

## 2016-06-14 DIAGNOSIS — M25561 Pain in right knee: Secondary | ICD-10-CM | POA: Diagnosis not present

## 2016-06-23 DIAGNOSIS — M545 Low back pain: Secondary | ICD-10-CM | POA: Diagnosis not present

## 2016-06-23 DIAGNOSIS — M25561 Pain in right knee: Secondary | ICD-10-CM | POA: Diagnosis not present

## 2016-06-27 DIAGNOSIS — F840 Autistic disorder: Secondary | ICD-10-CM | POA: Diagnosis not present

## 2016-07-11 DIAGNOSIS — F840 Autistic disorder: Secondary | ICD-10-CM | POA: Diagnosis not present

## 2016-07-26 DIAGNOSIS — F840 Autistic disorder: Secondary | ICD-10-CM | POA: Diagnosis not present

## 2016-08-08 DIAGNOSIS — F840 Autistic disorder: Secondary | ICD-10-CM | POA: Diagnosis not present

## 2016-08-22 DIAGNOSIS — F840 Autistic disorder: Secondary | ICD-10-CM | POA: Diagnosis not present

## 2016-09-05 DIAGNOSIS — F840 Autistic disorder: Secondary | ICD-10-CM | POA: Diagnosis not present

## 2016-09-19 DIAGNOSIS — F840 Autistic disorder: Secondary | ICD-10-CM | POA: Diagnosis not present

## 2016-09-26 ENCOUNTER — Encounter: Payer: Self-pay | Admitting: Medical

## 2016-09-26 ENCOUNTER — Ambulatory Visit (INDEPENDENT_AMBULATORY_CARE_PROVIDER_SITE_OTHER): Payer: BLUE CROSS/BLUE SHIELD | Admitting: Medical

## 2016-09-26 VITALS — BP 132/80 | HR 68 | Wt 239.4 lb

## 2016-09-26 DIAGNOSIS — M62838 Other muscle spasm: Secondary | ICD-10-CM

## 2016-09-26 DIAGNOSIS — M25561 Pain in right knee: Secondary | ICD-10-CM

## 2016-09-26 DIAGNOSIS — M545 Low back pain, unspecified: Secondary | ICD-10-CM
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
## 2016-10-03 DIAGNOSIS — F840 Autistic disorder: Secondary | ICD-10-CM | POA: Diagnosis not present

## 2016-10-21 DIAGNOSIS — R351 Nocturia: Secondary | ICD-10-CM | POA: Diagnosis not present

## 2016-10-21 DIAGNOSIS — R350 Frequency of micturition: Secondary | ICD-10-CM | POA: Diagnosis not present

## 2016-10-21 DIAGNOSIS — R972 Elevated prostate specific antigen [PSA]: Secondary | ICD-10-CM | POA: Diagnosis not present

## 2016-10-24 DIAGNOSIS — F840 Autistic disorder: Secondary | ICD-10-CM | POA: Diagnosis not present

## 2016-10-28 ENCOUNTER — Ambulatory Visit (INDEPENDENT_AMBULATORY_CARE_PROVIDER_SITE_OTHER): Payer: Self-pay

## 2016-10-28 ENCOUNTER — Encounter (INDEPENDENT_AMBULATORY_CARE_PROVIDER_SITE_OTHER): Payer: Self-pay | Admitting: Orthopaedic Surgery

## 2016-10-28 ENCOUNTER — Ambulatory Visit (INDEPENDENT_AMBULATORY_CARE_PROVIDER_SITE_OTHER): Payer: BLUE CROSS/BLUE SHIELD | Admitting: Orthopaedic Surgery

## 2016-10-28 VITALS — BP 141/85 | HR 75 | Resp 14 | Ht 70.0 in | Wt 230.0 lb

## 2016-10-28 DIAGNOSIS — M25561 Pain in right knee: Secondary | ICD-10-CM

## 2016-10-28 DIAGNOSIS — G8929 Other chronic pain: Secondary | ICD-10-CM

## 2016-10-28 MED ORDER — METHYLPREDNISOLONE ACETATE 40 MG/ML IJ SUSP
80.0000 mg | INTRAMUSCULAR | Status: AC | PRN
  Administered 2016-10-28: 80 mg

## 2016-10-28 MED ORDER — LIDOCAINE HCL 1 % IJ SOLN
5.0000 mL | INTRAMUSCULAR | Status: AC | PRN
  Administered 2016-10-28: 5 mL

## 2016-10-28 MED ORDER — BUPIVACAINE HCL 0.5 % IJ SOLN
3.0000 mL | INTRAMUSCULAR | Status: AC | PRN
  Administered 2016-10-28: 3 mL via INTRA_ARTICULAR
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
## 2016-11-07 DIAGNOSIS — F840 Autistic disorder: Secondary | ICD-10-CM | POA: Diagnosis not present

## 2016-11-21 DIAGNOSIS — F840 Autistic disorder: Secondary | ICD-10-CM | POA: Diagnosis not present

## 2016-11-28 ENCOUNTER — Ambulatory Visit (INDEPENDENT_AMBULATORY_CARE_PROVIDER_SITE_OTHER): Payer: BLUE CROSS/BLUE SHIELD | Admitting: Orthopaedic Surgery

## 2016-12-05 DIAGNOSIS — F840 Autistic disorder: Secondary | ICD-10-CM | POA: Diagnosis not present

## 2016-12-09 ENCOUNTER — Ambulatory Visit (INDEPENDENT_AMBULATORY_CARE_PROVIDER_SITE_OTHER): Payer: BLUE CROSS/BLUE SHIELD | Admitting: Orthopaedic Surgery

## 2016-12-09 ENCOUNTER — Encounter (INDEPENDENT_AMBULATORY_CARE_PROVIDER_SITE_OTHER): Payer: Self-pay | Admitting: Orthopaedic Surgery

## 2016-12-09 VITALS — BP 162/94 | HR 63 | Resp 14 | Ht 69.0 in | Wt 230.0 lb

## 2016-12-09 DIAGNOSIS — M25561 Pain in right knee: Secondary | ICD-10-CM

## 2016-12-09 DIAGNOSIS — G8929 Other chronic pain: Secondary | ICD-10-CM | POA: Diagnosis not present
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
## 2016-12-19 DIAGNOSIS — F840 Autistic disorder: Secondary | ICD-10-CM | POA: Diagnosis not present

## 2016-12-27 ENCOUNTER — Other Ambulatory Visit (INDEPENDENT_AMBULATORY_CARE_PROVIDER_SITE_OTHER): Payer: Self-pay | Admitting: Orthopaedic Surgery

## 2016-12-27 DIAGNOSIS — G8929 Other chronic pain: Secondary | ICD-10-CM

## 2016-12-27 DIAGNOSIS — M25561 Pain in right knee: Principal | ICD-10-CM

## 2017-01-01 IMAGING — CT CT ABD-PELV W/ CM
2 of 4 series · 15 of 46 positions shown, 17 images · IV contrast (Omni 300)
Comparison: 11/13/2012 CT abdomen/ pelvis.

CLINICAL DATA: Generalized abdominal pain. History of colonic
surgery for ruptured diverticulosis. Gastrectomy for gastric tumor.

EXAM:
CT ABDOMEN AND PELVIS WITH CONTRAST
TECHNIQUE: Multidetector CT imaging of the abdomen and pelvis was performed
using the standard protocol following bolus administration of
intravenous contrast.
CONTRAST:  100mL OMNIPAQUE IOHEXOL 300 MG/ML  SOLN

[Series 3: a/p w/ 5mm · axial · 0.82mm/px · z∈[-1002,-537]mm · 12 of 103 slices shown, 14 images]
[im 5/103  soft-tissue]
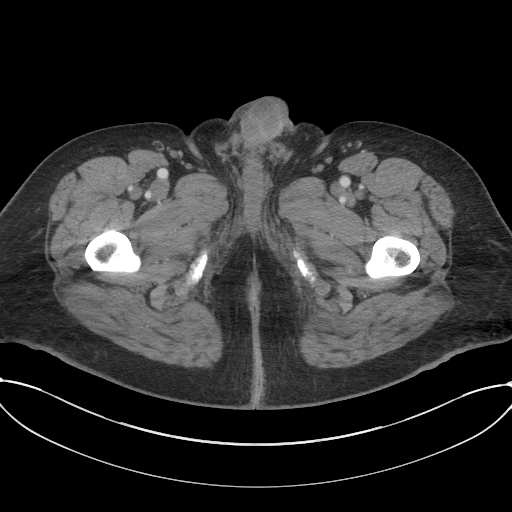
[im 5/103  bone]
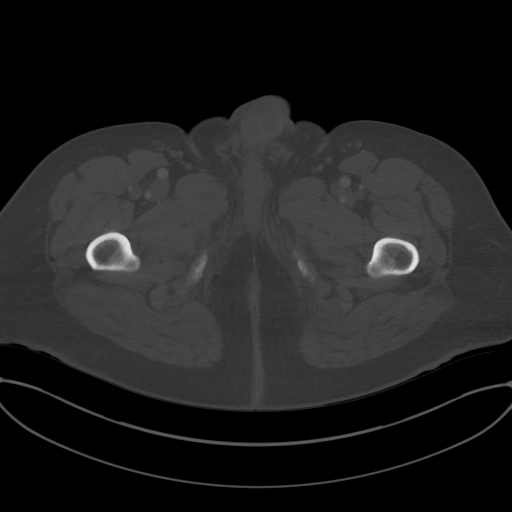
[im 14/103  soft-tissue]
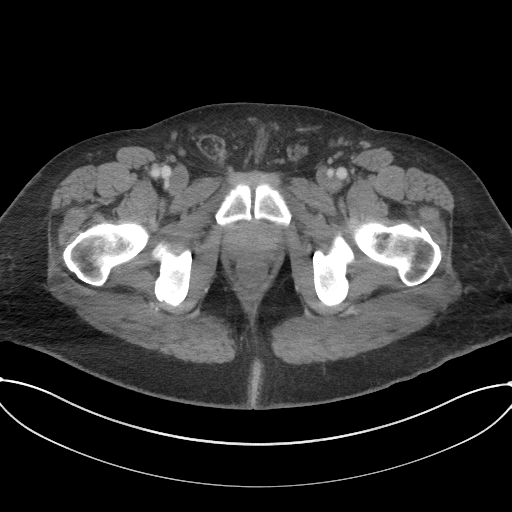
[im 24/103  soft-tissue]
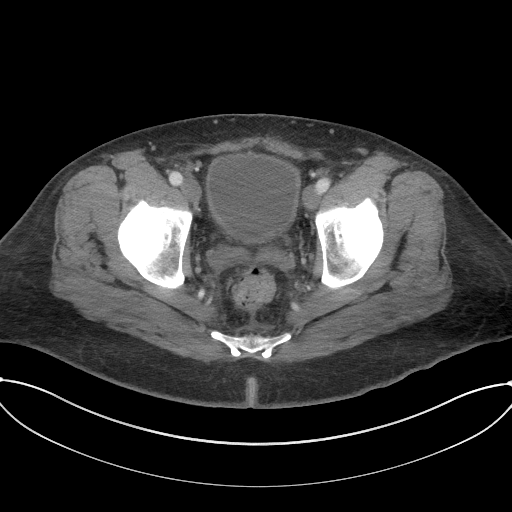
[im 33/103  soft-tissue]
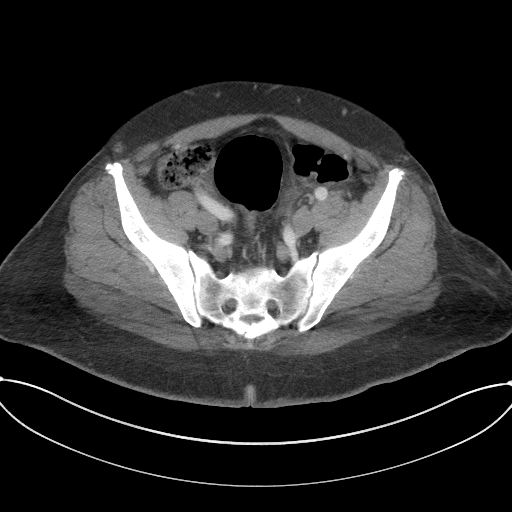
[im 38/103  soft-tissue]
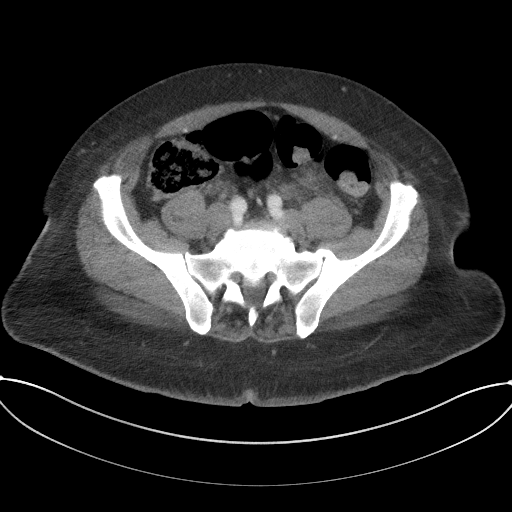
[im 47/103  soft-tissue]
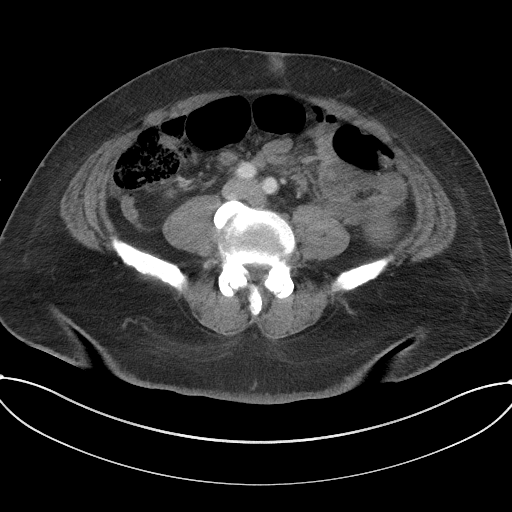
[im 56/103  soft-tissue]
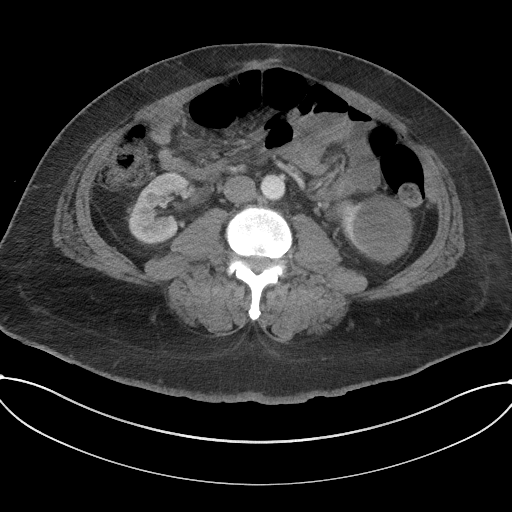
[im 65/103  soft-tissue]
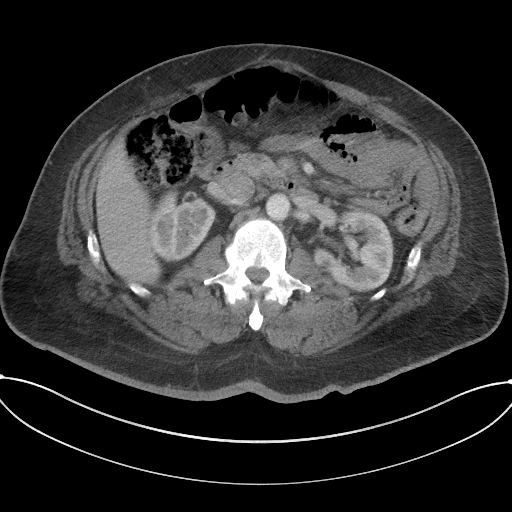
[im 70/103  soft-tissue]
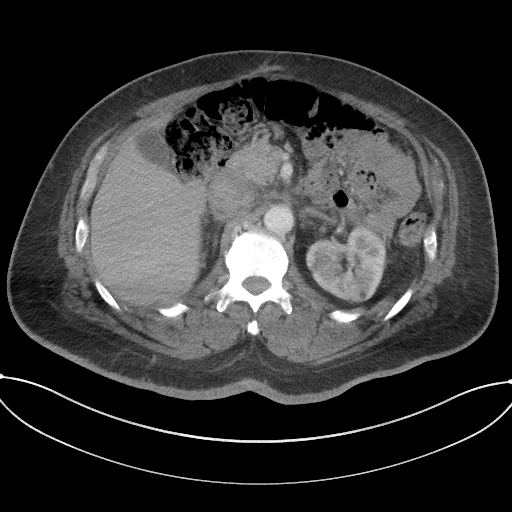
[im 70/103  bone]
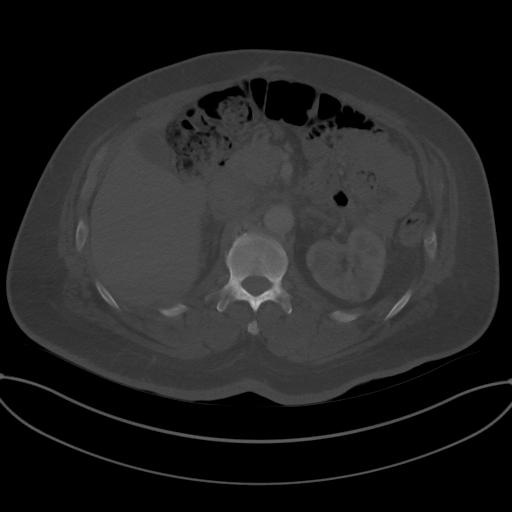
[im 79/103  soft-tissue]
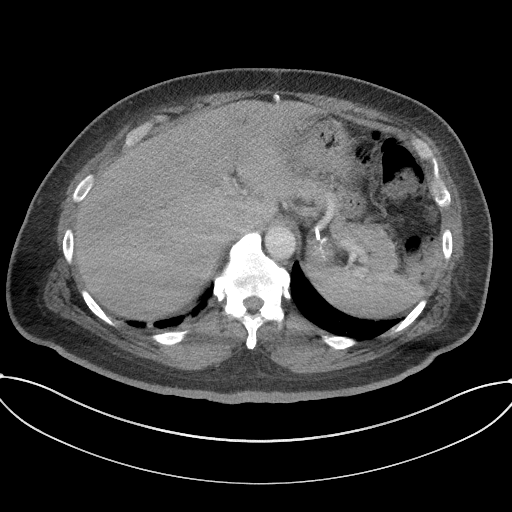
[im 89/103  soft-tissue]
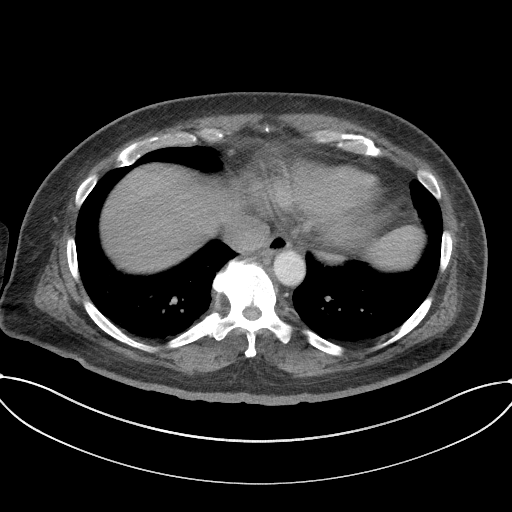
[im 98/103  soft-tissue]
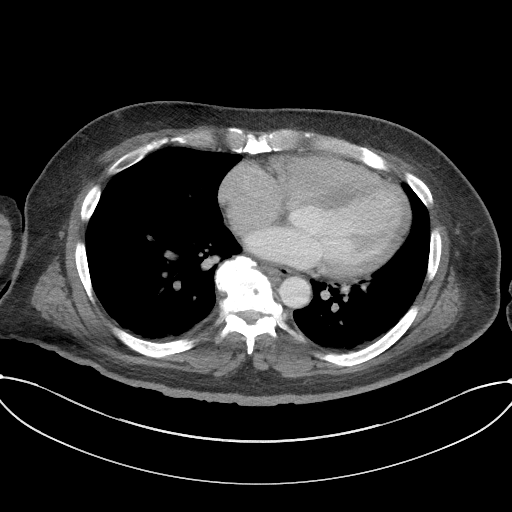

[Series 7: a/p w/ cor · coronal · 0.90mm/px · 3 of 162 slices shown]
[im 54/162  soft-tissue]
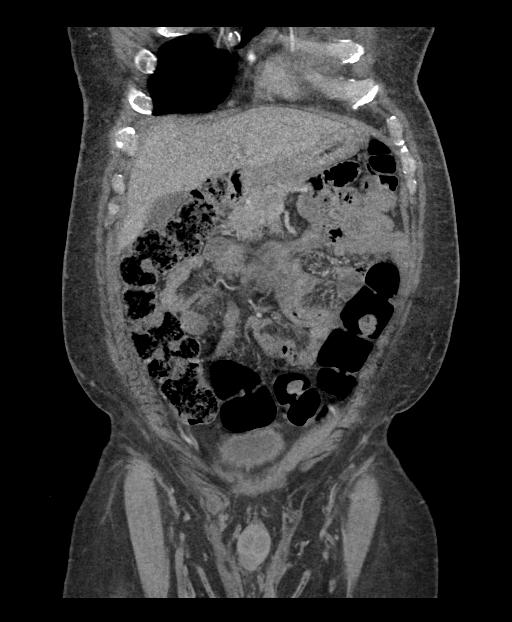
[im 72/162  soft-tissue]
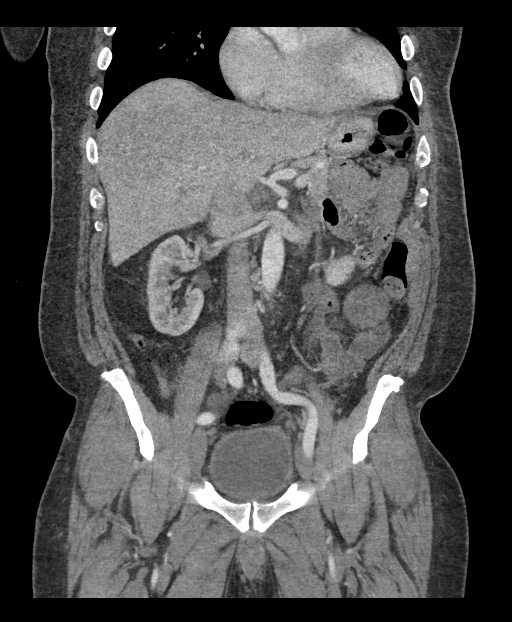
[im 90/162  soft-tissue]
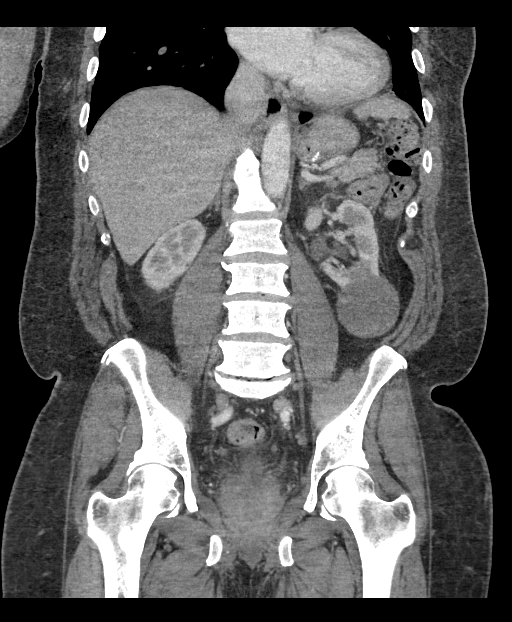

[15 of 46 positions shown; findings below may reference images not displayed]

FINDINGS: Lower chest: Subpleural left lower lobe 4 mm nodular focus (series
4/image 11), not definitely seen on prior CT.

Hepatobiliary: Normal liver with no liver mass. Normal gallbladder
with no radiopaque cholelithiasis. No biliary ductal dilatation.

Pancreas: Normal, with no mass or duct dilation.

Spleen: Normal size. No mass.

Adrenals/Urinary Tract: Normal adrenals. Hypodense 0.7 cm lateral
upper right renal lesion, too small to characterize, not appreciably
changed, likely a benign renal cyst. Simple 6.4 cm renal cyst in the
lower left kidney. Several additional subcentimeter hypodense
lesions in both kidneys are too small to characterize. Nonspecific
diffuse bladder wall thickening.

Stomach/Bowel: There are stable postsurgical changes status post
partial gastrectomy in the region of the gastric fundus, with
otherwise grossly normal collapsed stomach. Normal caliber small
bowel with no small bowel wall thickening. There is mild dilatation
and wall thickening in the distal appendix with mild periappendiceal
fat stranding, and a tip appendicitis cannot be excluded. Normal
large bowel with no diverticulosis, large bowel wall thickening or
pericolonic fat stranding.

Vascular/Lymphatic: Normal caliber abdominal aorta. Patent portal,
splenic and renal veins. No pathologically enlarged lymph nodes in
the abdomen or pelvis.

Reproductive: Mild prostatomegaly.

Other: No pneumoperitoneum, ascites or focal fluid collection.

Musculoskeletal: No aggressive appearing focal osseous lesions.
Moderate degenerative changes in the visualized thoracolumbar spine.
Stable small fat containing umbilical hernia. Stable small fat
containing bilateral inguinal hernias, right greater than left.
IMPRESSION: 1. Dilatation and wall thickening in the distal appendix with mild
periappendiceal fat stranding, cannot exclude a tip appendicitis. No
evidence of perforation or abscess.
2. Nonspecific diffuse bladder wall thickening with mild
prostatomegaly, which could indicate chronic bladder outlet
obstruction, although an acute cystitis cannot be excluded and
correlation with urinalysis is advised.
3. Left lower lobe 4 mm pulmonary nodule. If the patient is at high
risk for bronchogenic carcinoma, follow-up chest CT at 1 year is
recommended. If the patient is at low risk, no follow-up is needed.
This recommendation follows the consensus statement: Guidelines for
Management of Small Pulmonary Nodules Detected on CT Scans: A
Statement from the [HOSPITAL] as published in Radiology
2883; [DATE].
4. Small umbilical and bilateral inguinal hernias.
These results were called by telephone at the time of interpretation
on 01/17/2015 at [DATE] to Dr. Tripti, who verbally acknowledged
these results.

## 2017-01-02 DIAGNOSIS — F840 Autistic disorder: Secondary | ICD-10-CM | POA: Diagnosis not present

## 2017-01-15 ENCOUNTER — Ambulatory Visit
Admission: RE | Admit: 2017-01-15 | Discharge: 2017-01-15 | Disposition: A | Discharge status: Home / Self Care | Payer: BLUE CROSS/BLUE SHIELD | Source: Ambulatory Visit | Attending: Orthopaedic Surgery | Admitting: Orthopaedic Surgery

## 2017-01-15 DIAGNOSIS — G8929 Other chronic pain: Secondary | ICD-10-CM

## 2017-01-15 DIAGNOSIS — M25561 Pain in right knee: Principal | ICD-10-CM

## 2017-01-16 DIAGNOSIS — F840 Autistic disorder: Secondary | ICD-10-CM | POA: Diagnosis not present

## 2017-01-30 DIAGNOSIS — F840 Autistic disorder: Secondary | ICD-10-CM | POA: Diagnosis not present

## 2017-02-13 ENCOUNTER — Ambulatory Visit (INDEPENDENT_AMBULATORY_CARE_PROVIDER_SITE_OTHER): Payer: BLUE CROSS/BLUE SHIELD | Admitting: Orthopaedic Surgery

## 2017-02-13 ENCOUNTER — Encounter (INDEPENDENT_AMBULATORY_CARE_PROVIDER_SITE_OTHER): Payer: Self-pay | Admitting: Orthopaedic Surgery

## 2017-02-13 VITALS — BP 154/72 | HR 80 | Resp 12 | Ht 69.0 in | Wt 230.0 lb

## 2017-02-13 DIAGNOSIS — G8929 Other chronic pain: Secondary | ICD-10-CM | POA: Diagnosis not present

## 2017-02-13 DIAGNOSIS — F840 Autistic disorder: Secondary | ICD-10-CM | POA: Diagnosis not present

## 2017-02-13 DIAGNOSIS — M25561 Pain in right knee: Secondary | ICD-10-CM | POA: Diagnosis not present
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
## 2017-03-02 ENCOUNTER — Telehealth (INDEPENDENT_AMBULATORY_CARE_PROVIDER_SITE_OTHER): Payer: Self-pay | Admitting: Orthopaedic Surgery
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
## 2017-03-03 NOTE — Telephone Encounter (Signed)
Thanks

## 2017-03-03 NOTE — Telephone Encounter (Signed)
Please advise 

## 2017-03-06 DIAGNOSIS — F840 Autistic disorder: Secondary | ICD-10-CM | POA: Diagnosis not present

## 2017-03-20 ENCOUNTER — Inpatient Hospital Stay (INDEPENDENT_AMBULATORY_CARE_PROVIDER_SITE_OTHER): Payer: BLUE CROSS/BLUE SHIELD | Admitting: Orthopaedic Surgery

## 2017-03-27 DIAGNOSIS — F840 Autistic disorder: Secondary | ICD-10-CM | POA: Diagnosis not present

## 2017-04-12 ENCOUNTER — Ambulatory Visit: Payer: BLUE CROSS/BLUE SHIELD | Admitting: Medical

## 2017-04-12 ENCOUNTER — Encounter: Payer: Self-pay | Admitting: Medical

## 2017-04-12 VITALS — BP 144/86 | HR 77 | Temp 98.4°F | Wt 242.4 lb

## 2017-04-12 DIAGNOSIS — R6889 Other general symptoms and signs: Secondary | ICD-10-CM

## 2017-04-12 DIAGNOSIS — R5383 Other fatigue: Secondary | ICD-10-CM | POA: Diagnosis not present

## 2017-04-12 DIAGNOSIS — R52 Pain, unspecified: Secondary | ICD-10-CM

## 2017-04-12 DIAGNOSIS — R110 Nausea: Secondary | ICD-10-CM | POA: Diagnosis not present

## 2017-04-12 DIAGNOSIS — R05 Cough: Secondary | ICD-10-CM

## 2017-04-12 DIAGNOSIS — R059 Cough, unspecified: Secondary | ICD-10-CM

## 2017-04-12 LAB — POC INFLUENZA A&B (BINAX/QUICKVUE)
INFLUENZA A, POC: NEGATIVE
INFLUENZA B, POC: NEGATIVE

## 2017-04-12 MED ORDER — PROMETHAZINE-DM 6.25-15 MG/5ML PO SYRP: 5.0000 mL | ORAL_SOLUTION | Freq: Four times a day (QID) | ORAL | 0 refills | Status: DC | PRN

## 2017-04-12 MED ORDER — AMOXICILLIN 875 MG PO TABS: 875.0000 mg | ORAL_TABLET | Freq: Two times a day (BID) | ORAL | 0 refills | Status: DC

## 2017-04-12 NOTE — Patient Instructions (Signed)
Recommendations  Your flu test was negative today  We will check some baseline labs today to make sure nothing is blatantly wrong begin amoxicillin antibiotic for the next 10 days for respiratory tract infection  You can use promethazine DM cough syrup as needed  Rest, drink plenty of fluids, and you can use Mucinex DM over-the-counter for cough and congestion  We will call with lab results

## 2017-04-12 NOTE — Progress Notes (Signed)
Subjective: Chief Complaint  Patient presents with  . fatigue, chills, achey    x 1 month no fever, achey, stomach pain , gas    John Keller has a history of anemia, depression, history of gastric tumor, hypertension, sleep apnea, that does not come in often for routine care.  Here for complaint of fatigue and not feeling well for the past 2-3 weeks.  He reports feeling tired all the time, dizzy feeling at times, some body aches x last few weeks after having a major cold.  Has maybe felt feverish.   Has had some nausea.   A few times almost vomited.   Bowels can vary between constipation and diarrhea.   Has had some chills.   Has had headache, sore throat, cough, and sneezing.  No urinary complaints, no blood in the urine or stool, no rash, no chest pain or shortness of breath, no DOE.  No other aggravating or relieving factors. No other complaint.  Past Medical History:  Diagnosis Date  . Anemia 2009   noted during hospitalization for surgical I&D of chest, arm abscess  . Appendicitis   . Depression   . Gastric tumor 2013   resection  . GERD (gastroesophageal reflux disease)   . GI bleed 2013  . Hypertension   . Sleep apnea    Current Outpatient Medications on File Prior to Visit  Medication Sig Dispense Refill  . acetaminophen (TYLENOL) 500 MG tablet Take 1,000 mg by mouth every 6 (six) hours as needed (pain). Reported on 08/05/2015    . Ascorbic Acid (VITAMIN C) 100 MG tablet Take 100 mg by mouth daily.    . cholecalciferol (VITAMIN D) 1000 units tablet Take 1,000 Units by mouth daily.    . Multiple Vitamin (MULITIVITAMIN WITH MINERALS) TABS Take 1 tablet by mouth daily.    . tamsulosin (FLOMAX) 0.4 MG CAPS capsule Take 1 capsule by mouth daily.  1  . vitamin B-12 (CYANOCOBALAMIN) 1000 MCG tablet Take 1,000 mcg by mouth daily.    Marland Kitchen albuterol (PROVENTIL HFA;VENTOLIN HFA) 108 (90 Base) MCG/ACT inhaler Inhale 2 puffs into the lungs every 4 (four) hours as needed for wheezing or shortness of  breath. (Patient not taking: Reported on 04/12/2017) 1 Inhaler 1  . linaclotide (LINZESS) 72 MCG capsule Take 1 capsule (72 mcg total) by mouth daily before breakfast. (Patient not taking: Reported on 04/12/2017) 16 capsule 0  . omeprazole (PRILOSEC) 40 MG capsule Take 1 capsule (40 mg total) by mouth daily. (Patient not taking: Reported on 04/12/2017) 30 capsule 3   No current facility-administered medications on file prior to visit.    ROS  As in subjective   Objective: BP (!) 144/86   Pulse 77   Temp 98.4 F (36.9 C)   Wt 242 lb 6.4 oz (110 kg)   SpO2 98%   BMI 35.80 kg/m   Wt Readings from Last 3 Encounters:  04/12/17 242 lb 6.4 oz (110 kg)  02/13/17 230 lb (104.3 kg)  12/09/16 230 lb (104.3 kg)   General appearance: alert, no distress, WD/WN, not particularly ill appearing HEENT: normocephalic, sclerae anicteric, PERRLA, EOMi, nares patent, no discharge., mild erythema, pharynx with mild erythema Oral cavity: MMM, no lesions Neck: supple, no lymphadenopathy, no thyromegaly, no masses Heart: RRR, normal S1, S2, no murmurs Lungs: CTA bilaterally, no wheezes, rhonchi, or rales Abdomen: +bs, soft, non tender, non distended, no masses, no hepatomegaly, no splenomegaly Back: non tender to palpation, but reports some low back pain when  lying down Musculoskeletal: non tender, no swelling, no obvious deformity Extremities: no edema, no cyanosis, no clubbing Pulses: 2+ symmetric, upper and lower extremities, normal cap refill Neurological: alert, oriented x 3, CN2-12 intact, strength normal upper extremities and lower extremities, sensation normal throughout, DTRs 2+ throughout, no cerebellar signs, gait normal Psychiatric: normal affect, behavior normal, pleasant    Assessment: Encounter Diagnoses  Name Primary?  . Other fatigue Yes  . Cough   . Flu-like symptoms   . Nausea   . Body aches      Plan: We discussed his symptoms and exam findings.  I will go ahead and treat  him for respiratory tract infection although he is always hard to read as he comes in generally once a year for the same sort of fatigue complaint with no real significant finding.  Labs today as well.  Discussed supportive care for cough and congestion, hydration, rest.   [name] was seen today for fatigue, chills, achey.  Diagnoses and all orders for this visit:  Other fatigue -     CBC with Differential/Platelet -     Comprehensive metabolic panel -     TSH -     VITAMIN D 25 Hydroxy (Vit-D Deficiency, Fractures)  Cough  Flu-like symptoms -     POC Influenza A&B(BINAX/QUICKVUE)  Nausea -     CBC with Differential/Platelet -     Comprehensive metabolic panel  Body aches  Other orders -     amoxicillin (AMOXIL) 875 MG tablet; Take 1 tablet (875 mg total) by mouth 2 (two) times daily. -     promethazine-dextromethorphan (PROMETHAZINE-DM) 6.25-15 MG/5ML syrup; Take 5 mLs by mouth 4 (four) times daily as needed for cough.

## 2017-04-13 LAB — COMPREHENSIVE METABOLIC PANEL
ALK PHOS: 91 IU/L (ref 39–117)
ALT: 14 IU/L (ref 0–44)
AST: 15 IU/L (ref 0–40)
Albumin/Globulin Ratio: 1.2 (ref 1.2–2.2)
Albumin: 3.8 g/dL (ref 3.6–4.8)
BILIRUBIN TOTAL: 1.1 mg/dL (ref 0.0–1.2)
BUN/Creatinine Ratio: 16 (ref 10–24)
BUN: 15 mg/dL (ref 8–27)
CHLORIDE: 106 mmol/L (ref 96–106)
CO2: 22 mmol/L (ref 20–29)
CREATININE: 0.93 mg/dL (ref 0.76–1.27)
Calcium: 8.5 mg/dL — ABNORMAL LOW (ref 8.6–10.2)
GFR calc Af Amer: 103 mL/min/{1.73_m2} (ref >59)
GFR calc non Af Amer: 89 mL/min/{1.73_m2} (ref >59)
Globulin, Total: 3.2 g/dL (ref 1.5–4.5)
Glucose: 80 mg/dL (ref 65–99)
Potassium: 3.9 mmol/L (ref 3.5–5.2)
SODIUM: 142 mmol/L (ref 134–144)
Total Protein: 7 g/dL (ref 6.0–8.5)

## 2017-04-13 LAB — CBC WITH DIFFERENTIAL/PLATELET
BASOS ABS: 0 10*3/uL (ref 0.0–0.2)
Basos: 1 %
EOS (ABSOLUTE): 0.2 10*3/uL (ref 0.0–0.4)
Eos: 3 %
Hematocrit: 42.8 % (ref 37.5–51.0)
Hemoglobin: 14.4 g/dL (ref 13.0–17.7)
Immature Grans (Abs): 0 10*3/uL (ref 0.0–0.1)
Immature Granulocytes: 0 %
LYMPHS ABS: 1.4 10*3/uL (ref 0.7–3.1)
Lymphs: 21 %
MCH: 30.2 pg (ref 26.6–33.0)
MCHC: 33.6 g/dL (ref 31.5–35.7)
MCV: 90 fL (ref 79–97)
MONOS ABS: 0.3 10*3/uL (ref 0.1–0.9)
Monocytes: 4 %
Neutrophils Absolute: 4.6 10*3/uL (ref 1.4–7.0)
Neutrophils: 71 %
Platelets: 213 10*3/uL (ref 150–379)
RBC: 4.77 x10E6/uL (ref 4.14–5.80)
RDW: 14.5 % (ref 12.3–15.4)
WBC: 6.5 10*3/uL (ref 3.4–10.8)

## 2017-04-13 LAB — TSH: TSH: 4 u[IU]/mL (ref 0.450–4.500)

## 2017-04-13 LAB — VITAMIN D 25 HYDROXY (VIT D DEFICIENCY, FRACTURES): Vit D, 25-Hydroxy: 35.3 ng/mL (ref 30.0–100.0)

## 2017-04-17 DIAGNOSIS — F840 Autistic disorder: Secondary | ICD-10-CM | POA: Diagnosis not present

## 2017-04-18 ENCOUNTER — Ambulatory Visit (INDEPENDENT_AMBULATORY_CARE_PROVIDER_SITE_OTHER): Payer: BLUE CROSS/BLUE SHIELD | Admitting: Orthopaedic Surgery

## 2017-04-18 ENCOUNTER — Encounter (INDEPENDENT_AMBULATORY_CARE_PROVIDER_SITE_OTHER): Payer: Self-pay | Admitting: Orthopaedic Surgery

## 2017-04-18 VITALS — BP 176/99 | HR 71 | Resp 12 | Ht 69.0 in | Wt 242.0 lb

## 2017-04-18 DIAGNOSIS — G8929 Other chronic pain: Secondary | ICD-10-CM

## 2017-04-18 DIAGNOSIS — M25561 Pain in right knee: Secondary | ICD-10-CM
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
## 2017-05-08 DIAGNOSIS — F840 Autistic disorder: Secondary | ICD-10-CM | POA: Diagnosis not present

## 2017-05-24 ENCOUNTER — Telehealth (INDEPENDENT_AMBULATORY_CARE_PROVIDER_SITE_OTHER): Payer: Self-pay | Admitting: Orthopaedic Surgery

## 2017-05-24 NOTE — Telephone Encounter (Signed)
Patient left a voicemail stating he is ready to schedule surgery.

## 2017-05-25 NOTE — Telephone Encounter (Signed)
Please advise 

## 2017-05-25 NOTE — Telephone Encounter (Signed)
Ok to set up 

## 2017-05-26 NOTE — Telephone Encounter (Signed)
The patient has decided to get the knee surgery.I copied, faxed and resent you his surgery sheet that was filled out a few months ago. Thank you!!

## 2017-05-29 DIAGNOSIS — F840 Autistic disorder: Secondary | ICD-10-CM | POA: Diagnosis not present

## 2017-06-20 DIAGNOSIS — F840 Autistic disorder: Secondary | ICD-10-CM | POA: Diagnosis not present

## 2017-06-20 NOTE — H&P (Addendum)
John Keller is a 62 y.o. male.   Chief Complaint: Painful right knee  HPI: John Keller is a 62 year old male who has been having trouble off and on with his right knee for many years. In 2014 he had an MRI scan that revealed a possible tear of the medial meniscus at its root. He tried exercises and even had a shot of cortisone seemed to to improve only to have recurrence of his pain after an injury earlier this year. He's been having pain medially with stiffness soreness and achiness. MRI scan had some clicking but no sensation of his knee giving way. He works several jobs and is up and down over the course of the day and his knee interferes with his activities. There has been some swelling. No numbness or tingling. No back pain. No hip discomfort.  An intra-articular cortisone injection was performed On October 28, 2016 of which she had several weeks of relief but then had recurrent pain and discomfort.  An MRI scan was ordered.  At that time he was noted to aggressive radial tearing of the meniscal root and progressive extrusion of the medial meniscus from the joint.  There is also progressive degenerative tearing throughout the remainder of the posterior horn and body of the meniscus.  No displaced meniscal fragments identified.  Progressive chondral thinning of the medial compartment was identified but the lateral compartment was relatively preserved.  A discussion of treatment plans ensued and it was felt that an arthroscopic debridement was indicated.   Past Medical History:  Diagnosis Date  . Anemia 2009   noted during hospitalization for surgical I&D of chest, arm abscess  . Appendicitis   . Depression   . Gastric tumor 2013   resection  . GERD (gastroesophageal reflux disease)   . GI bleed 2013  . Hypertension   . Sleep apnea     Past Surgical History:  Procedure Laterality Date  . ESOPHAGOGASTRODUODENOSCOPY  05/05/2011   Procedure: ESOPHAGOGASTRODUODENOSCOPY (EGD);  Surgeon: Irene Shipper, MD;  Location: [facility] ENDOSCOPY;  Service: Endoscopy;  Laterality: N/A;  . GASTRECTOMY    . INCISE AND DRAIN ABCESS  2009   patient had abscesses and cellulitis of the chest and arm which grew out microfollicular strap.    Family History  Problem Relation Age of Onset  . Diabetes Mother   . Stroke Mother   . Aneurysm Father        died of brain aneurysm  . Anesthesia problems Neg Hx   . Hypotension Neg Hx   . Malignant hyperthermia Neg Hx   . Pseudochol deficiency Neg Hx    Social History:  reports that he has never smoked. He has never used smokeless tobacco. He reports that he does not drink alcohol or use drugs.  Allergies: No Known Allergies   No current facility-administered medications for this encounter.    Current Outpatient Medications  Medication Sig Dispense Refill  . acetaminophen (TYLENOL) 500 MG tablet Take 1,000 mg by mouth every 6 (six) hours as needed (pain). Reported on 08/05/2015    . albuterol (PROVENTIL HFA;VENTOLIN HFA) 108 (90 Base) MCG/ACT inhaler Inhale 2 puffs into the lungs every 4 (four) hours as needed for wheezing or shortness of breath. 1 Inhaler 1  . amoxicillin (AMOXIL) 875 MG tablet Take 1 tablet (875 mg total) by mouth 2 (two) times daily. (Patient not taking: Reported on 06/21/2017) 20 tablet 0  . Ascorbic Acid (VITAMIN C) 100 MG tablet  Take 100 mg by mouth daily.    . cholecalciferol (VITAMIN D) 1000 units tablet Take 1,000 Units by mouth daily.    Marland Kitchen linaclotide (LINZESS) 72 MCG capsule Take 1 capsule (72 mcg total) by mouth daily before breakfast. (Patient taking differently: Take 72 mcg by mouth as needed. ) 16 capsule 0  . Multiple Vitamin (MULITIVITAMIN WITH MINERALS) TABS Take 1 tablet by mouth daily.    Marland Kitchen omeprazole (PRILOSEC) 40 MG capsule Take 1 capsule (40 mg total) by mouth daily. (Patient taking differently: Take 40 mg by mouth as needed. ) 30 capsule 3  . promethazine-dextromethorphan (PROMETHAZINE-DM) 6.25-15 MG/5ML syrup Take 5 mLs  by mouth 4 (four) times daily as needed for cough. 120 mL 0  . tamsulosin (FLOMAX) 0.4 MG CAPS capsule Take 1 capsule by mouth as needed.   1  . vitamin B-12 (CYANOCOBALAMIN) 1000 MCG tablet Take 1,000 mcg by mouth daily.       No medications prior to admission.    No results found for this or any previous visit (from the past 48 hour(s)). No results found.  ROS  Constitutional: Positive for fatigue.  HENT: Positive for sinus pressure. Negative for hearing loss.   Respiratory: Positive for shortness of breath. Negative for apnea and chest tightness.   Cardiovascular: Negative for chest pain, palpitations and leg swelling.  Gastrointestinal: Negative for blood in stool, constipation and diarrhea.  Genitourinary: Negative for difficulty urinating.  Musculoskeletal: Positive for back pain and neck stiffness. Negative for arthralgias, joint swelling, myalgias and neck pain.  Neurological: Positive for speech difficulty and headaches. Negative for weakness and numbness.  Hematological: Does not bruise/bleed easily.  Psychiatric/Behavioral: Positive for sleep disturbance. The patient is not nervous/anxious.     Vital Signs: BP (!) 167/93 (BP Location: Right Arm, Patient Position: Sitting, Cuff Size: Normal)   Pulse 72   Temp (!) 97.2 F (36.2 C)   Resp 16   Ht 5\' 10"  (1.778 m)   Wt 249 lb (112.9 kg)   BMI 35.73 kg/m   Physical Exam  Constitutional: He is oriented to person, place, and time. He appears well-developed and well-nourished.  HENT:  Head: Normocephalic and atraumatic.  Mouth/Throat: Oropharynx is clear and moist.  Eyes: Pupils are equal, round, and reactive to light. Conjunctivae and EOM are normal.  Cardiovascular: Normal rate, regular rhythm, normal heart sounds and intact distal pulses.  Respiratory: Effort normal. He has wheezes.  And expiratory wheezes laterally  GI: Soft. Bowel sounds are normal. There is no tenderness.  Neurological: He is alert and oriented  to person, place, and time.  Skin: Skin is warm and dry.  Musculoskeletal: Range of motion 0 to 105degrees.  Does have some crepitance with range of motion.  Walks with antalgic gait.  Assessment/Plan Clinical impression: Torn medial meniscus of the right knee with progressive chondral thinning of the medial compartment.  Plan: Arthroscopy of the right knee with meniscectomy and chondroplasty as indicated.   Biagio Borg, PA-C 06/21/2017, 9:50 AM

## 2017-06-21 ENCOUNTER — Ambulatory Visit (INDEPENDENT_AMBULATORY_CARE_PROVIDER_SITE_OTHER): Payer: BLUE CROSS/BLUE SHIELD | Admitting: Orthopedic Surgery

## 2017-06-21 ENCOUNTER — Encounter (INDEPENDENT_AMBULATORY_CARE_PROVIDER_SITE_OTHER): Payer: Self-pay | Admitting: Orthopedic Surgery

## 2017-06-21 ENCOUNTER — Telehealth: Payer: Self-pay

## 2017-06-21 DIAGNOSIS — S83242D Other tear of medial meniscus, current injury, left knee, subsequent encounter: Secondary | ICD-10-CM

## 2017-06-21 NOTE — Telephone Encounter (Signed)
Orthopedics contacted our office to inform us that patient was seen there today and his blood pressure was 167/93. Patient had some fatigue and SOB. Patient is scheduled to have a surgery dine next month. Pt has been scheduled for an appointment here tomorrow with Dr. Tomi Bamberger.

## 2017-06-21 NOTE — Progress Notes (Signed)
Chief Complaint  Patient presents with  . Hypertension    saw ortho yesterday for pre-op consult and they were concerned about a 165/80 reading. He states that he has also been very fatigued and having SOB off and on since Oct. Has been seen by [facility] for some chest congestion and given cough medicine to try to help with the chest congestion/SOB. Says he aches all over as well.     Patient is being seen today because while at his orthopedist's office yesterday his BP was elevated at 167/93, and he was reporting some fatigue and SOB.  He is supposed to have surgery next month.  He thinks his trouble breathing is related to the pollen, dusty work environment, with sick exposures.  Breathing issues have been going on for the last year. Every time he feels like he gets better, he gets sick again.   Denies cough. Describes it feeling "closed up" or tight when he works hard, moving heavy things around. No problems when walking (tries to park far away) or with climbing stairs.  Chart reviewed.  He has had some mildly elevated BP's for a while.   It appears that in 04/2015 Audelia Acton recommend that he start lopressor 25mg  BID, to treat hypertension, and he was having daily headaches at that time as well. He recalls taking it, not refilling it. Doesn't recall side effects or why it wasn't continued.  When discussing diuretics, he also recalls taking these in past, but I do not see any prescriptions in the system.  He has sleep apnea, has CPAP machine, but admits to not using it recently, due to getting tangled up in the hose.  Hasn't used it in weeks. He hasn't been feeling refreshed (partly due to only getting 5 hours sleep).    Pain level is usually 6-7/10. NSAID's help for a while, down to 3/10.  Knee surgery is scheduled for 5/7. He has a blood pressure monitor (wrist type) at home, hasn't been checking.  +fast foods, with french fries. +soups He doesn't have time to cook.  He recently had labs done  (03/2017) also for complaint of fatigue-- Vitamin D-OH was normal at 35.3 Lab Results  Component Value Date   WBC 6.5 04/12/2017   HGB 14.4 04/12/2017   HCT 42.8 04/12/2017   MCV 90 04/12/2017   PLT 213 04/12/2017   Lab Results  Component Value Date   TSH 4.000 04/12/2017     Chemistry      Component Value Date/Time   NA 142 04/12/2017 0956   K 3.9 04/12/2017 0956   CL 106 04/12/2017 0956   CO2 22 04/12/2017 0956   BUN 15 04/12/2017 0956   CREATININE 0.93 04/12/2017 0956   CREATININE 1.17 05/03/2016 1029      Component Value Date/Time   CALCIUM 8.5 (L) 04/12/2017 0956   ALKPHOS 91 04/12/2017 0956   AST 15 04/12/2017 0956   ALT 14 04/12/2017 0956   BILITOT 1.1 04/12/2017 0956     PMH, PSH, SH reviewed.  Outpatient Encounter Medications as of 06/22/2017  Medication Sig  . Ascorbic Acid (VITAMIN C) 1000 MG tablet Take 1,000 mg by mouth daily.  . Cholecalciferol (VITAMIN D3) 2000 units TABS Take 2,000 Units by mouth daily.  . Cyanocobalamin (VITAMIN B-12) 5000 MCG SUBL Place 5,000 mcg under the tongue daily.  . Multiple Vitamin (MULITIVITAMIN WITH MINERALS) TABS Take 1 tablet by mouth daily.  . tamsulosin (FLOMAX) 0.4 MG CAPS capsule Take 0.4 mg by mouth daily  as needed (for urinary retention).   Marland Kitchen acetaminophen (TYLENOL) 500 MG tablet Take 1,000 mg by mouth every 6 (six) hours as needed (pain). Reported on 08/05/2015  . ibuprofen (ADVIL,MOTRIN) 200 MG tablet Take 400 mg by mouth every 8 (eight) hours as needed (for pain.).  Marland Kitchen linaclotide (LINZESS) 72 MCG capsule Take 1 capsule (72 mcg total) by mouth daily before breakfast. (Patient not taking: Reported on 06/22/2017)  . naproxen sodium (ALEVE) 220 MG tablet Take 440 mg by mouth 2 (two) times daily as needed (for pain.).  Marland Kitchen omeprazole (PRILOSEC) 40 MG capsule Take 1 capsule (40 mg total) by mouth daily. (Patient not taking: Reported on 06/22/2017)  . [DISCONTINUED] amoxicillin (AMOXIL) 875 MG tablet Take 1 tablet (875 mg total)  by mouth 2 (two) times daily. (Patient not taking: Reported on 06/21/2017)  . [DISCONTINUED] promethazine-dextromethorphan (PROMETHAZINE-DM) 6.25-15 MG/5ML syrup Take 5 mLs by mouth 4 (four) times daily as needed for cough. (Patient not taking: Reported on 06/21/2017)   No facility-administered encounter medications on file as of 06/22/2017.    Takes either naproxen or ibuprofen or tylenol for pain. Usually takes ibuprofen in the morning, once daily.  No Known Allergies  ROS: no fever, chills, URI symptoms. Some allergies. Intermittent chest tightness/SOB per HPI. No nausea, vomiting, abdominal pain, denies heartburn. No bleeding, bruising, rash. +knee pain. Denies headaches, dizziness.   PHYSICAL EXAM:  BP 140/90 (BP Location: Right Arm, Cuff Size: Normal)   Pulse 72   Temp 98.1 F (36.7 C) (Tympanic)   Ht 5\' 10"  (1.778 m)   Wt 246 lb 9.6 oz (111.9 kg)   SpO2 98%   BMI 35.38 kg/m   160/94 on repeat by MD  Wt Readings from Last 3 Encounters:  06/21/17 249 lb (112.9 kg)  04/18/17 242 lb (109.8 kg)  04/12/17 242 lb 6.4 oz (110 kg)   BP Readings from Last 3 Encounters:  06/21/17 (!) 167/93  04/18/17 (!) 176/99  04/12/17 (!) 144/86   Well appearing male, in no distress, appears calm, comfortable, not anxious.  He has somewhat of a flat affect/voice HEENT: conjunctiva and sclera are clear, EOMI. TM's and EAC's normal, nasal mucosa with mild edema, no erythema or purulence. OP is clear. Sinuses nontender. Neck: no lymphadenopathy or mass Heart: regular rate and rhythm, no murmur Lungs: clear bilaterally, no wheezes, rales, ronchi Abdomen: obese, soft, normal bowel sounds. Tender in epigastrium, and somewhat diffusely elsewhere (left side)--winced some during exam, no rebound/guarding, mass Extremities: no edema Skin: normal turgor, no rash Neuro: alert and oriented, cranial nerves intact, normal gait  EKG: NSR, compared to 2016, no significant change, no acute abnl. No  LVH   ASSESSMENT/PLAN:  Essential hypertension - start HCTZ 25mg ; Low Na diet reviewed, significant changes can be made. K+ rich diet reviewed. Monitor BP and f/u 1 week w/Shane and bring list/monitor - Plan: hydrochlorothiazide (HYDRODIURIL) 25 MG tablet  Elevated blood pressure reading - Plan: EKG 12-Lead  OSA (obstructive sleep apnea) - noncompliant with CPAP use, which can contribute to his fatigue and elevated BP's. Encouraged him to use this faithfully.  Other fatigue - this is not a new complaint; recent labs fine (borderly TSH, no other thyroid sx). Allergies/illness may contribute, and OSA  Right knee pain, unspecified chronicity - surgery planned. Encouraged regular PPI use, given mild epigastric tenderness and frequent NSAID use   Please monitor your blood pressure regularly, record the values (along with your pain scores and other important factors like whether you had  a headache, whether you used your CPAP, etc). Bring your sheet of paper and your wrist monitor to your follow-up appointment.    Start HCTZ 25mg .  Start with a full tablet once daily in the morning. Be sure to eat potassium-rich foods in your diet (ie bananas). If your blood pressure drops or you have significant dizziness or side effects, then you can cut the tablet in half.  See Audelia Acton in  1 week, bring in your list of blood pressures and your monitor.  Please wear your CPAP every night--to reduce risks for heart complications, and also helps with blood pressure.  Please look at the info provided on low sodium diet. Read the labels in the stores on canned goods (fresh/frozen is better). When eating out, avoid fried foods (including french fries), processed meats (hot dogs, bacon/sausage/salami), [place] food (soy sauce).  Be sure that you are taking your omeprazole every single day.  You were very tender over your stomach, and the pain medications you take can cause stomach problems.  The omeprazole can help  protect your stomach.

## 2017-06-21 NOTE — Progress Notes (Signed)
Chief Complaint: Painful right knee  HPI: John Keller is a 62 year old male who has been having trouble off and on with his right knee for many years. In 2014 he had an MRI scan that revealed a possible tear of the medial meniscus at its root. He tried exercises and even had a shot of cortisone seemed to to improve only to have recurrence of his pain after an injury earlier this year. He's been having pain medially with stiffness soreness and achiness. MRI scan had some clicking but no sensation of his knee giving way. He works several jobs and is up and down over the course of the day and his knee interferes with his activities. There has been some swelling. No numbness or tingling. No back pain. No hip discomfort.  An intra-articular cortisone injection was performed On October 28, 2016 of which she had several weeks of relief but then had recurrent pain and discomfort.  An MRI scan was ordered.  At that time he was noted to aggressive radial tearing of the meniscal root and progressive extrusion of the medial meniscus from the joint.  There is also progressive degenerative tearing throughout the remainder of the posterior horn and body of the meniscus.  No displaced meniscal fragments identified.  Progressive chondral thinning of the medial compartment was identified but the lateral compartment was relatively preserved.  A discussion of treatment plans ensued and it was felt that an arthroscopic debridement was indicated.   Past Medical History:  Diagnosis Date  . Anemia 2009   noted during hospitalization for surgical I&D of chest, arm abscess  . Appendicitis   . Depression   . Gastric tumor 2013   resection  . GERD (gastroesophageal reflux disease)   . GI bleed 2013  . Hypertension   . Sleep apnea     Past Surgical History:  Procedure Laterality Date  . ESOPHAGOGASTRODUODENOSCOPY  05/05/2011   Procedure: ESOPHAGOGASTRODUODENOSCOPY (EGD);  Surgeon: Irene Shipper, MD;  Location: [facility] ENDOSCOPY;   Service: Endoscopy;  Laterality: N/A;  . GASTRECTOMY    . INCISE AND DRAIN ABCESS  2009   patient had abscesses and cellulitis of the chest and arm which grew out microfollicular strap.    Family History  Problem Relation Age of Onset  . Diabetes Mother   . Stroke Mother   . Aneurysm Father        died of brain aneurysm  . Anesthesia problems Neg Hx   . Hypotension Neg Hx   . Malignant hyperthermia Neg Hx   . Pseudochol deficiency Neg Hx    Social History:  reports that he has never smoked. He has never used smokeless tobacco. He reports that he does not drink alcohol or use drugs.  Allergies: No Known Allergies   No current facility-administered medications for this encounter.    Current Outpatient Medications  Medication Sig Dispense Refill  . acetaminophen (TYLENOL) 500 MG tablet Take 1,000 mg by mouth every 6 (six) hours as needed (pain). Reported on 08/05/2015    . albuterol (PROVENTIL HFA;VENTOLIN HFA) 108 (90 Base) MCG/ACT inhaler Inhale 2 puffs into the lungs every 4 (four) hours as needed for wheezing or shortness of breath. 1 Inhaler 1  . amoxicillin (AMOXIL) 875 MG tablet Take 1 tablet (875 mg total) by mouth 2 (two) times daily. (Patient not taking: Reported on 06/21/2017) 20 tablet 0  . Ascorbic Acid (VITAMIN C) 100 MG tablet Take 100 mg by mouth daily.    Marland Kitchen  cholecalciferol (VITAMIN D) 1000 units tablet Take 1,000 Units by mouth daily.    Marland Kitchen linaclotide (LINZESS) 72 MCG capsule Take 1 capsule (72 mcg total) by mouth daily before breakfast. (Patient taking differently: Take 72 mcg by mouth as needed. ) 16 capsule 0  . Multiple Vitamin (MULITIVITAMIN WITH MINERALS) TABS Take 1 tablet by mouth daily.    Marland Kitchen omeprazole (PRILOSEC) 40 MG capsule Take 1 capsule (40 mg total) by mouth daily. (Patient taking differently: Take 40 mg by mouth as needed. ) 30 capsule 3  . promethazine-dextromethorphan (PROMETHAZINE-DM) 6.25-15 MG/5ML syrup Take 5 mLs by mouth 4 (four) times daily as  needed for cough. 120 mL 0  . tamsulosin (FLOMAX) 0.4 MG CAPS capsule Take 1 capsule by mouth as needed.   1  . vitamin B-12 (CYANOCOBALAMIN) 1000 MCG tablet Take 1,000 mcg by mouth daily.       No medications prior to admission.    No results found for this or any previous visit (from the past 48 hour(s)). No results found.  ROS  Constitutional: Positive for fatigue.  HENT: Positive for sinus pressure. Negative for hearing loss.   Respiratory: Positive for shortness of breath. Negative for apnea and chest tightness.   Cardiovascular: Negative for chest pain, palpitations and leg swelling.  Gastrointestinal: Negative for blood in stool, constipation and diarrhea.  Genitourinary: Negative for difficulty urinating.  Musculoskeletal: Positive for back pain and neck stiffness. Negative for arthralgias, joint swelling, myalgias and neck pain.  Neurological: Positive for speech difficulty and headaches. Negative for weakness and numbness.  Hematological: Does not bruise/bleed easily.  Psychiatric/Behavioral: Positive for sleep disturbance. The patient is not nervous/anxious.     Vital Signs: BP (!) 167/93 (BP Location: Right Arm, Patient Position: Sitting, Cuff Size: Normal)   Pulse 72   Temp (!) 97.2 F (36.2 C)   Resp 16   Ht 5\' 10"  (1.778 m)   Wt 249 lb (112.9 kg)   BMI 35.73 kg/m   Physical Exam  Constitutional: He is oriented to person, place, and time. He appears well-developed and well-nourished.  HENT:  Head: Normocephalic and atraumatic.  Mouth/Throat: Oropharynx is clear and moist.  Eyes: Pupils are equal, round, and reactive to light. Conjunctivae and EOM are normal.  Cardiovascular: Normal rate, regular rhythm, normal heart sounds and intact distal pulses.  Respiratory: Effort normal. He has wheezes.  And expiratory wheezes laterally  GI: Soft. Bowel sounds are normal. There is no tenderness.  Neurological: He is alert and oriented to person, place, and time.   Skin: Skin is warm and dry.  Musculoskeletal: Range of motion 0 to 105degrees.  Does have some crepitance with range of motion.  Walks with antalgic gait.  Assessment/Plan Clinical impression: Torn medial meniscus of the right knee with progressive chondral thinning of the medial compartment.  Plan: Arthroscopy of the right knee with meniscectomy and chondroplasty as indicated.   Biagio Borg, PA-C 06/21/2017, 9:50 AM
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
## 2017-06-22 ENCOUNTER — Encounter: Payer: Self-pay | Admitting: Family Medicine

## 2017-06-22 ENCOUNTER — Ambulatory Visit: Payer: BLUE CROSS/BLUE SHIELD | Admitting: Family Medicine

## 2017-06-22 VITALS — BP 140/90 | HR 72 | Temp 98.1°F | Ht 70.0 in | Wt 246.6 lb

## 2017-06-22 DIAGNOSIS — R5383 Other fatigue: Secondary | ICD-10-CM

## 2017-06-22 DIAGNOSIS — I10 Essential (primary) hypertension: Secondary | ICD-10-CM

## 2017-06-22 DIAGNOSIS — R030 Elevated blood-pressure reading, without diagnosis of hypertension: Secondary | ICD-10-CM

## 2017-06-22 DIAGNOSIS — G4733 Obstructive sleep apnea (adult) (pediatric): Secondary | ICD-10-CM

## 2017-06-22 DIAGNOSIS — M25561 Pain in right knee: Secondary | ICD-10-CM

## 2017-06-22 MED ORDER — HYDROCHLOROTHIAZIDE 25 MG PO TABS: 25.0000 mg | ORAL_TABLET | Freq: Every day | ORAL | 0 refills | Status: DC

## 2017-06-22 NOTE — Patient Instructions (Addendum)
Please monitor your blood pressure regularly, record the values (along with your pain scores and other important factors like whether you had a headache, whether you used your CPAP, etc). Bring your sheet of paper and your wrist monitor to your follow-up appointment.    Start HCTZ 25mg .  Start with a full tablet once daily in the morning. Be sure to eat potassium-rich foods in your diet (ie bananas). If your blood pressure drops or you have significant dizziness or side effects, then you can cut the tablet in half.  See Audelia Acton in  1 week, bring in your list of blood pressures and your monitor.  Please wear your CPAP every night--to reduce risks for heart complications, and also helps with blood pressure.  Please look at the info provided on low sodium diet. Read the labels in the stores on canned goods (fresh/frozen is better). When eating out, avoid fried foods (including french fries), processed meats (hot dogs, bacon/sausage/salami), [place] food (soy sauce).  Be sure that you are taking your omeprazole every single day.  You were very tender over your stomach, and the pain medications you take can cause stomach problems.  The omeprazole can help protect your stomach.    Low-Sodium Eating Plan Sodium, which is an element that makes up salt, helps you maintain a healthy balance of fluids in your body. Too much sodium can increase your blood pressure and cause fluid and waste to be held in your body. Your health care provider or dietitian may recommend following this plan if you have high blood pressure (hypertension), kidney disease, liver disease, or heart failure. Eating less sodium can help lower your blood pressure, reduce swelling, and protect your heart, liver, and kidneys. What are tips for following this plan? General guidelines  Most people on this plan should limit their sodium intake to 1,500-2,000 mg (milligrams) of sodium each day. Reading food labels  The Nutrition Facts  label lists the amount of sodium in one serving of the food. If you eat more than one serving, you must multiply the listed amount of sodium by the number of servings.  Choose foods with less than 140 mg of sodium per serving.  Avoid foods with 300 mg of sodium or more per serving. Shopping  Look for lower-sodium products, often labeled as "low-sodium" or "no salt added."  Always check the sodium content even if foods are labeled as "unsalted" or "no salt added".  Buy fresh foods. ? Avoid canned foods and premade or frozen meals. ? Avoid canned, cured, or processed meats  Buy breads that have less than 80 mg of sodium per slice. Cooking  Eat more home-cooked food and less restaurant, buffet, and fast food.  Avoid adding salt when cooking. Use salt-free seasonings or herbs instead of table salt or sea salt. Check with your health care provider or pharmacist before using salt substitutes.  Cook with plant-based oils, such as canola, sunflower, or olive oil. Meal planning  When eating at a restaurant, ask that your food be prepared with less salt or no salt, if possible.  Avoid foods that contain MSG (monosodium glutamate). MSG is sometimes added to [place] food, bouillon, and some canned foods. What foods are recommended? The items listed may not be a complete list. Talk with your dietitian about what dietary choices are best for you. Grains Low-sodium cereals, including oats, puffed wheat and rice, and shredded wheat. Low-sodium crackers. Unsalted rice. Unsalted pasta. Low-sodium bread. Whole-grain breads and whole-grain pasta. Vegetables Fresh or  frozen vegetables. "No salt added" canned vegetables. "No salt added" tomato sauce and paste. Low-sodium or reduced-sodium tomato and vegetable juice. Fruits Fresh, frozen, or canned fruit. Fruit juice. Meats and other protein foods Fresh or frozen (no salt added) meat, poultry, seafood, and fish. Low-sodium canned tuna and salmon.  Unsalted nuts. Dried peas, beans, and lentils without added salt. Unsalted canned beans. Eggs. Unsalted nut butters. Dairy Milk. Soy milk. Cheese that is naturally low in sodium, such as ricotta cheese, fresh mozzarella, or Swiss cheese Low-sodium or reduced-sodium cheese. Cream cheese. Yogurt. Fats and oils Unsalted butter. Unsalted margarine with no trans fat. Vegetable oils such as canola or olive oils. Seasonings and other foods Fresh and dried herbs and spices. Salt-free seasonings. Low-sodium mustard and ketchup. Sodium-free salad dressing. Sodium-free light mayonnaise. Fresh or refrigerated horseradish. Lemon juice. Vinegar. Homemade, reduced-sodium, or low-sodium soups. Unsalted popcorn and pretzels. Low-salt or salt-free chips. What foods are not recommended? The items listed may not be a complete list. Talk with your dietitian about what dietary choices are best for you. Grains Instant hot cereals. Bread stuffing, pancake, and biscuit mixes. Croutons. Seasoned rice or pasta mixes. Noodle soup cups. Boxed or frozen macaroni and cheese. Regular salted crackers. Self-rising flour. Vegetables Sauerkraut, pickled vegetables, and relishes. Olives. [place] fries. Onion rings. Regular canned vegetables (not low-sodium or reduced-sodium). Regular canned tomato sauce and paste (not low-sodium or reduced-sodium). Regular tomato and vegetable juice (not low-sodium or reduced-sodium). Frozen vegetables in sauces. Meats and other protein foods Meat or fish that is salted, canned, smoked, spiced, or pickled. Bacon, ham, sausage, hotdogs, corned beef, chipped beef, packaged lunch meats, salt pork, jerky, pickled herring, anchovies, regular canned tuna, sardines, salted nuts. Dairy Processed cheese and cheese spreads. Cheese curds. Blue cheese. Feta cheese. String cheese. Regular cottage cheese. Buttermilk. Canned milk. Fats and oils Salted butter. Regular margarine. Ghee. Bacon fat. Seasonings and other  foods Onion salt, garlic salt, seasoned salt, table salt, and sea salt. Canned and packaged gravies. Worcestershire sauce. Tartar sauce. Barbecue sauce. Teriyaki sauce. Soy sauce, including reduced-sodium. Steak sauce. Fish sauce. Oyster sauce. Cocktail sauce. Horseradish that you find on the shelf. Regular ketchup and mustard. Meat flavorings and tenderizers. Bouillon cubes. Hot sauce and Tabasco sauce. Premade or packaged marinades. Premade or packaged taco seasonings. Relishes. Regular salad dressings. Salsa. Potato and tortilla chips. Corn chips and puffs. Salted popcorn and pretzels. Canned or dried soups. Pizza. Frozen entrees and pot pies. Summary  Eating less sodium can help lower your blood pressure, reduce swelling, and protect your heart, liver, and kidneys.  Most people on this plan should limit their sodium intake to 1,500-2,000 mg (milligrams) of sodium each day.  Canned, boxed, and frozen foods are high in sodium. Restaurant foods, fast foods, and pizza are also very high in sodium. You also get sodium by adding salt to food.  Try to cook at home, eat more fresh fruits and vegetables, and eat less fast food, canned, processed, or prepared foods. This information is not intended to replace advice given to you by your health care provider. Make sure you discuss any questions you have with your health care provider. Document Released: 08/06/2001 Document Revised: 02/08/2016 Document Reviewed: 02/08/2016 Elsevier Interactive Patient Education  [employer].

## 2017-06-23 NOTE — Pre-Procedure Instructions (Signed)
John Keller  06/23/2017    Your procedure is scheduled on Tuesday, Jul 04, 2017 at 7:15 AM.   Report to [facility] Entr[facility]A" Admitting Office at 5:30 AM.   Call this number if you have problems the morning of surgery: [phone]   Questions prior to day of surgery, please call [phone] between 8 & 4 PM.   Remember:  Do not eat food or drink liquids after midnight Monday, 07/03/17.  Take these medicines the morning of surgery with A SIP OF WATER: Tamsulosin (Flomax), Tylenol - if needed  Stop Multivitamins and NSAIDS (Ibuprofen, Naproxen, Aleve, etc) 7 days prior to surgery. Do not use Aspirin products 7 days prior to surgery.   Do not wear jewelry.  Do not wear lotions, powders, cologne or deodorant.  Men may shave face and neck.  Do not bring valuables to the hospital.  [facility] is not responsible for any belongings or valuables.  Contacts, dentures or bridgework may not be worn into surgery.  Leave your suitcase in the car.  After surgery it may be brought to your room.  For patients admitted to the hospital, discharge time will be determined by your treatment team.  Patients discharged the day of surgery will not be allowed to drive home.   [place] - Preparing for Surgery  Before surgery, you can play an important role.  Because skin is not sterile, your skin needs to be as free of germs as possible.  You can reduce the number of germs on you skin by washing with CHG (chlorahexidine gluconate) soap before surgery.  CHG is an antiseptic cleaner which kills germs and bonds with the skin to continue killing germs even after washing.  Please DO NOT use if you have an allergy to CHG or antibacterial soaps.  If your skin becomes reddened/irritated stop using the CHG and inform your nurse when you arrive at Short Stay.  Do not shave (including legs and underarms) for at least 48 hours prior to the first CHG shower.  You may shave your face.  Please follow  these instructions carefully:   1.  Shower with CHG Soap the night before surgery and the                    morning of Surgery.  2.  If you choose to wash your hair, wash your hair first as usual with your       normal shampoo.  3.  After you shampoo, rinse your hair and body thoroughly to remove the shampoo.  4.  Use CHG as you would any other liquid soap.  You can apply chg directly       to the skin and wash gently with scrungie or a clean washcloth.  5.  Apply the CHG Soap to your body ONLY FROM THE NECK DOWN.        Do not use on open wounds or open sores.  Avoid contact with your eyes, ears, mouth and genitals (private parts).  Wash genitals (private parts) with your normal soap.  6.  Wash thoroughly, paying special attention to the area where your surgery        will be performed.  7.  Thoroughly rinse your body with warm water from the neck down.  8.  DO NOT shower/wash with your normal soap after using and rinsing off       the CHG Soap.  9.  Pat yourself dry with a  clean towel.            10.  Wear clean pajamas.            11.  Place clean sheets on your bed the night of your first shower and do not        sleep with pets.  Day of Surgery  Shower as above. Do not apply any lotions/deodorants the morning of surgery.  Please wear clean clothes to the hospital.   Please read over the fact sheets that you were given.

## 2017-06-26 ENCOUNTER — Encounter (HOSPITAL_COMMUNITY)
Admission: RE | Admit: 2017-06-26 | Discharge: 2017-06-26 | Disposition: A | Discharge status: Home / Self Care | Payer: BLUE CROSS/BLUE SHIELD | Source: Ambulatory Visit | Attending: Orthopaedic Surgery | Admitting: Orthopaedic Surgery

## 2017-06-26 ENCOUNTER — Other Ambulatory Visit (HOSPITAL_COMMUNITY): Payer: Self-pay | Admitting: *Deleted

## 2017-06-26 ENCOUNTER — Encounter (HOSPITAL_COMMUNITY): Payer: Self-pay

## 2017-06-26 ENCOUNTER — Inpatient Hospital Stay (INDEPENDENT_AMBULATORY_CARE_PROVIDER_SITE_OTHER): Payer: BLUE CROSS/BLUE SHIELD | Admitting: Orthopaedic Surgery

## 2017-06-26 ENCOUNTER — Other Ambulatory Visit: Payer: Self-pay

## 2017-06-26 DIAGNOSIS — Z01812 Encounter for preprocedural laboratory examination: Secondary | ICD-10-CM | POA: Diagnosis not present

## 2017-06-26 HISTORY — DX: Pneumonia, unspecified organism: J18.9

## 2017-06-26 HISTORY — DX: Unspecified osteoarthritis, unspecified site: M19.90

## 2017-06-26 HISTORY — DX: Personal history of other diseases of the digestive system: Z87.19

## 2017-06-26 HISTORY — DX: Dyspnea, unspecified: R06.00

## 2017-06-26 HISTORY — DX: Anxiety disorder, unspecified: F41.9

## 2017-06-26 HISTORY — DX: Irritable bowel syndrome, unspecified: K58.9

## 2017-06-26 HISTORY — DX: Allergy status to unspecified drugs, medicaments and biological substances: Z88.9

## 2017-06-26 LAB — BASIC METABOLIC PANEL
Anion gap: 7 (ref 5–15)
BUN: 15 mg/dL (ref 6–20)
CHLORIDE: 106 mmol/L (ref 101–111)
CO2: 25 mmol/L (ref 22–32)
CREATININE: 0.97 mg/dL (ref 0.61–1.24)
Calcium: 8.6 mg/dL — ABNORMAL LOW (ref 8.9–10.3)
GFR calc Af Amer: >60 mL/min (ref >60)
GFR calc non Af Amer: >60 mL/min (ref >60)
Glucose, Bld: 88 mg/dL (ref 65–99)
Potassium: 3.7 mmol/L (ref 3.5–5.1)
SODIUM: 138 mmol/L (ref 135–145)

## 2017-06-26 LAB — CBC
HCT: 41.9 % (ref 39.0–52.0)
Hemoglobin: 13.9 g/dL (ref 13.0–17.0)
MCH: 30 pg (ref 26.0–34.0)
MCHC: 33.2 g/dL (ref 30.0–36.0)
MCV: 90.3 fL (ref 78.0–100.0)
Platelets: 189 10*3/uL (ref 150–400)
RBC: 4.64 MIL/uL (ref 4.22–5.81)
RDW: 14 % (ref 11.5–15.5)
WBC: 6.4 10*3/uL (ref 4.0–10.5)

## 2017-06-26 NOTE — Progress Notes (Signed)
Pt denies cardiac history or chest pain. States he has sob when he gets anxious or in a hurry. Pt was seen last week by Dr. Aris Georgia because of high blood pressure. She started him on HCTZ. Pt did not pick up the Rx until this AM. I instructed pt that he needs to start the medication today. BP here was 160/100. Pt was also instructed by Dr. Tomi Bamberger to start using his CPAP but has not done that either. He states he can't breathe with the mask on. I told him to check with the company that he got it from and see if there are any other masks that he could use. He states he will. Pt states he does not have anyone that will be here with him during surgery and no one he can ask to pick him up. He will be staying one night. I gave him the number for [employer] to be able to use after surgery.

## 2017-06-27 NOTE — Progress Notes (Addendum)
Anesthesia Chart Review:  Case:  [id number] Date/Time:  07/04/17 0700   Procedure:  RIGHT KNEE ARTHROSCOPY WITH DEBRIDEMENT, MEDIAL MENISCECTOMY (Right )   Anesthesia type:  General   Pre-op diagnosis:  TORN MEDIAL MENISCUS RIGHT KNEE   Location:  [facility] OR ROOM 07 / [place] [facility]   Surgeon:  Garald Balding, MD      DISCUSSION: Patient is a 62 year old male scheduled for the above procedure.   History includes never smoker, OSA (non-compliance due to CPAP intolerance), GERD, hiatal hernia, IBS, HTN, dyspnea, anxiety, gastric tumor resection (leiomyoma) 05/06/11. BMI is consistent with obesity.   BP was elevated at PAT. He is starting HCTZ and has PCP follow-up scheduled for 06/29/17. Chart will be left for follow-up regarding any additional recommendations--hopefully HTN will be better controlled for surgery.   Addendum 07/03/17 3:41 PM: Patient was re-evaluated on 06/29/17 by his PCP. His home pressures had been trending down from 188/107 to 143/96. BP was 124/86 in the office. Losartan 25 mg added to HCTZ regimen. He takes both in the morning. Discussed perioperative medication instructions with anesthesiologist Dr. Adele Barthel. If home BP readings are remaining elevated, he may benefit from taking one or both antihypertensives for this procedure.  I called and spoke with patient. He reports home BP readings have typically been 150's/90's. I advised him that if BP is in this range or higher on the day of surgery then he can take losartan with sips of water, but to hold HCTZ. If for some reason, he is not able to take losartan before arrival then staff can check vitals on arrival and address with anesthesiologist as needed. Patient is a planned overnight stay because he has no one to stay with him after surgery.)  VS: BP (!) 160/102 Comment: taken in r arm notified Helene Kelp RN  Pulse 62   Resp 18   Ht 5\' 10"  (1.778 m)   Wt 248 lb 3.2 oz (112.6 kg)   SpO2 97%   BMI 35.61 kg/m  He did not fill HCTZ prescription  prescribed on 06/22/17 until 06/26/17 and had not taken it prior to PAT. (He had been on Lopressor in 2017, but apparently did not refill it once it ran out.)   PROVIDERS: Tysinger, Camelia Eng, PA-C is PCP. Next appointment scheduled for 06/29/17.  He is not followed routinely by cardiology, but was seen by Dr. Sanda Klein in March 2013 during admission for GI bleed, tachycardia, abnormal EKG with work-up revealing a bleeding leiomyoma s/p partial gastrectomy. He had an echo during that hospitalization and had an out-patient a non-ischemic stress test 05/2011.      LABS: Labs reviewed: Acceptable for surgery. (all labs ordered are listed, but only abnormal results are displayed)  Labs Reviewed  BASIC METABOLIC PANEL - Abnormal; Notable for the following components:      Result Value   Calcium 8.6 (*)    All other components within normal limits  CBC    EKG: 06/22/17 ([facility]): NSR, possible LAE, low voltage QRS, cannot rule out anterior infarct (age undetermined).    CV: Nuclear stress test 06/14/11: IMPRESSION: Normal myocardial perfusion study with left ventricular ejection fraction of 55%.  Echo 05/09/11: Study Conclusions Left ventricle: The cavity size was normal. There was mild concentric hypertrophy. Systolic function was normal. The estimated ejection fraction was in the range of 60% to 65%. Wall motion was normal; there were no regional wall motion abnormalities. Doppler parameters are consistent with abnormal  left ventricular relaxation (grade 1 diastolic dysfunction).         Past Medical History:  Diagnosis Date  . Anemia 2009   noted during hospitalization for surgical I&D of chest, arm abscess  . Anxiety   . Appendicitis   . Arthritis   . Depression   . Dyspnea    with anxiety and some exertion  . Gastric tumor 2013   resection  . GERD (gastroesophageal reflux disease)   . GI bleed 2013  . H/O seasonal allergies   . History of hiatal  hernia   . Hypertension   . IBS (irritable bowel syndrome)   . Pneumonia   . Sleep apnea    has a cpap machine but seldom uses it.     Past Surgical History:  Procedure Laterality Date  . COLONOSCOPY    . ESOPHAGOGASTRODUODENOSCOPY  05/05/2011   Procedure: ESOPHAGOGASTRODUODENOSCOPY (EGD);  Surgeon: Irene Shipper, MD;  Location: [facility] ENDOSCOPY;  Service: Endoscopy;  Laterality: N/A;  . GASTRECTOMY    . INCISE AND DRAIN ABCESS  2009   patient had abscesses and cellulitis of the chest and arm which grew out microfollicular strap.  . TONSILLECTOMY      MEDICATIONS: . acetaminophen (TYLENOL) 500 MG tablet  . Ascorbic Acid (VITAMIN C) 1000 MG tablet  . Cholecalciferol (VITAMIN D3) 2000 units TABS  . Cyanocobalamin (VITAMIN B-12) 5000 MCG SUBL  . hydrochlorothiazide (HYDRODIURIL) 25 MG tablet  . ibuprofen (ADVIL,MOTRIN) 200 MG tablet  . linaclotide (LINZESS) 72 MCG capsule  . Multiple Vitamin (MULITIVITAMIN WITH MINERALS) TABS  . naproxen sodium (ALEVE) 220 MG tablet  . omeprazole (PRILOSEC) 40 MG capsule  . tamsulosin (FLOMAX) 0.4 MG CAPS capsule   No current facility-administered medications for this encounter.    [facility] Short Stay Center/Anesthesiology Phone [phone] 06/27/2017 5:13 PM

## 2017-06-29 ENCOUNTER — Ambulatory Visit: Payer: BLUE CROSS/BLUE SHIELD | Admitting: Medical

## 2017-06-29 ENCOUNTER — Encounter: Payer: Self-pay | Admitting: Medical

## 2017-06-29 VITALS — BP 124/86 | HR 87 | Temp 98.0°F | Ht 68.5 in | Wt 239.2 lb

## 2017-06-29 DIAGNOSIS — M25561 Pain in right knee: Secondary | ICD-10-CM

## 2017-06-29 DIAGNOSIS — G4730 Sleep apnea, unspecified: Secondary | ICD-10-CM | POA: Diagnosis not present

## 2017-06-29 DIAGNOSIS — M25562 Pain in left knee: Secondary | ICD-10-CM

## 2017-06-29 DIAGNOSIS — I10 Essential (primary) hypertension: Secondary | ICD-10-CM | POA: Diagnosis not present

## 2017-06-29 MED ORDER — LOSARTAN POTASSIUM 25 MG PO TABS: 25.0000 mg | ORAL_TABLET | Freq: Every day | ORAL | 0 refills | Status: DC

## 2017-06-29 NOTE — Progress Notes (Signed)
Subjective: Chief Complaint  Patient presents with  . Blood Pressure Check   Here for f/u on blood pressure.  He was seen here 06/22/17 with Dr. Tomi Bamberger.  At that time he was seen for eval after BP was elevated at orthopedic office.  He endorsed fatigue, SOB, and noncompliance with CPAP.  Last visit he was advised to start HCTZ and be more compliant with CPAP, was counseled on diet and exercise.    He has remote hx/o HTN but was diet controlled for long time.   He is compliant with the HCTZ started last visit.  The first day he started HCTZ was bad, kept running to the bathroom.   He was getting some belly discomfort.  He is taking his Prilosec daily and Pepto bismol prn and this is helping.   He is tolerating the medication ok now.     Pressures at home have been trending down from 188/107 to 143/96.     Using CPAP more since last visit.    Having surgery next Tuesday morning for arthroscopic for right knee, Dr. Durward Fortes.     Past Medical History:  Diagnosis Date  . Anemia 2009   noted during hospitalization for surgical I&D of chest, arm abscess  . Anxiety   . Appendicitis   . Arthritis   . Depression   . Dyspnea    with anxiety and some exertion  . Gastric tumor 2013   resection  . GERD (gastroesophageal reflux disease)   . GI bleed 2013  . H/O seasonal allergies   . History of hiatal hernia   . Hypertension   . IBS (irritable bowel syndrome)   . Pneumonia   . Sleep apnea    has a cpap machine but seldom uses it.    Current Outpatient Medications on File Prior to Visit  Medication Sig Dispense Refill  . acetaminophen (TYLENOL) 500 MG tablet Take 1,000 mg by mouth every 6 (six) hours as needed (pain). Reported on 08/05/2015    . Ascorbic Acid (VITAMIN C) 1000 MG tablet Take 1,000 mg by mouth daily.    . Cholecalciferol (VITAMIN D3) 2000 units TABS Take 2,000 Units by mouth daily.    . Cyanocobalamin (VITAMIN B-12) 5000 MCG SUBL Place 5,000 mcg under the tongue daily.    .  hydrochlorothiazide (HYDRODIURIL) 25 MG tablet Take 1 tablet (25 mg total) by mouth daily. 30 tablet 0  . ibuprofen (ADVIL,MOTRIN) 200 MG tablet Take 400 mg by mouth every 8 (eight) hours as needed (for pain.).    Marland Kitchen linaclotide (LINZESS) 72 MCG capsule Take 1 capsule (72 mcg total) by mouth daily before breakfast. 16 capsule 0  . Multiple Vitamin (MULITIVITAMIN WITH MINERALS) TABS Take 1 tablet by mouth daily.    . naproxen sodium (ALEVE) 220 MG tablet Take 440 mg by mouth 2 (two) times daily as needed (for pain.).    Marland Kitchen omeprazole (PRILOSEC) 40 MG capsule Take 1 capsule (40 mg total) by mouth daily. 30 capsule 3  . tamsulosin (FLOMAX) 0.4 MG CAPS capsule Take 0.4 mg by mouth daily as needed (for urinary retention).   1   No current facility-administered medications on file prior to visit.    ROS as in subjective   Objective: BP 124/86   Pulse 87   Temp 98 F (36.7 C) (Oral)   Ht 5' 8.5" (1.74 m)   Wt 239 lb 3.2 oz (108.5 kg)   SpO2 97%   BMI 35.84 kg/m   132/90 BP  readings right arm by me today  BP Readings from Last 3 Encounters:  06/29/17 124/86  06/26/17 (!) 160/102  06/22/17 140/90   Wt Readings from Last 3 Encounters:  06/29/17 239 lb 3.2 oz (108.5 kg)  06/26/17 248 lb 3.2 oz (112.6 kg)  06/22/17 246 lb 9.6 oz (111.9 kg)   General appearance: alert, no distress, WD/WN,  Neck: supple, no lymphadenopathy, no thyromegaly, no masses Heart: RRR, normal S1, S2, no murmurs Lungs: CTA bilaterally, no wheezes, rhonchi, or rales Pulses: 2+ symmetric, upper and lower extremities, normal cap refill Ext: no edema     Assessment: Encounter Diagnoses  Name Primary?  . Essential hypertension Yes  . Mild sleep apnea   . Pain in both knees, unspecified chronicity      Plan: HTN - add Losartan.  Discussed risks /benefits.   C/t HCTZ started last visit.     Patient Instructions  Recommendations:  Begin/Add Losartan 25mg  daily  For now continue HCTZ hydrochlorothiazide  25mg  daily   Take both medications together in the morning  Limit salt intake  Use your CPAP machine  Get me BP blood pressure readings in a month before you run out of the medication.  You can mail, drop off or fax the results.  Make sure you are drinking plenty of water daily  Before you run out of the 2 medications, I may end up changing you to a combo pill that has both medications in 1 tablet  Best wishes on the surgery for a speedy recover      CPAP issues with mask - will contact home health to call him for supplies to help find better fitting mask.  John Keller was seen today for blood pressure check.  Diagnoses and all orders for this visit:  Essential hypertension  Mild sleep apnea  Pain in both knees, unspecified chronicity  Other orders -     losartan (COZAAR) 25 MG tablet; Take 1 tablet (25 mg total) by mouth daily.  f/u 28mo

## 2017-06-29 NOTE — Patient Instructions (Signed)
Recommendations:  Begin/Add Losartan 25mg  daily  For now continue HCTZ hydrochlorothiazide 25mg  daily   Take both medications together in the morning  Limit salt intake  Use your CPAP machine  Get me BP blood pressure readings in a month before you run out of the medication.  You can mail, drop off or fax the results.  Make sure you are drinking plenty of water daily  Before you run out of the 2 medications, I may end up changing you to a combo pill that has both medications in 1 tablet  Best wishes on the surgery for a speedy recover

## 2017-07-03 ENCOUNTER — Encounter (HOSPITAL_COMMUNITY): Payer: Self-pay | Admitting: Anesthesiology

## 2017-07-03 NOTE — Anesthesia Preprocedure Evaluation (Addendum)
Anesthesia Evaluation  Patient identified by MRN, date of birth, ID band Patient awake    Reviewed: Allergy & Precautions, NPO status , Patient's Chart, lab work & pertinent test results  Airway Mallampati: II  TM Distance: >3 FB Neck ROM: Full    Dental  (+) Missing,    Pulmonary sleep apnea and Continuous Positive Airway Pressure Ventilation ,    breath sounds clear to auscultation       Cardiovascular hypertension, Pt. on medications  Rhythm:Regular Rate:Normal     Neuro/Psych  Headaches, PSYCHIATRIC DISORDERS Anxiety Depression    GI/Hepatic Neg liver ROS, hiatal hernia, GERD  Medicated,  Endo/Other  negative endocrine ROS  Renal/GU negative Renal ROS     Musculoskeletal  (+) Arthritis ,   Abdominal (+) + obese,   Peds  Hematology   Anesthesia Other Findings   Reproductive/Obstetrics                            Lab Results  Component Value Date   WBC 6.4 06/26/2017   HGB 13.9 06/26/2017   HCT 41.9 06/26/2017   MCV 90.3 06/26/2017   PLT 189 06/26/2017   Lab Results  Component Value Date   CREATININE 0.97 06/26/2017   BUN 15 06/26/2017   NA 138 06/26/2017   K 3.7 06/26/2017   CL 106 06/26/2017   CO2 25 06/26/2017   Lab Results  Component Value Date   INR 1.08 01/18/2015   INR 1.16 05/06/2011   EKG: NSR  Anesthesia Physical Anesthesia Plan  ASA: II  Anesthesia Plan: General   Post-op Pain Management:    Induction: Intravenous  PONV Risk Score and Plan: 3 and Ondansetron, Dexamethasone and Midazolam  Airway Management Planned: LMA  Additional Equipment: None  Intra-op Plan:   Post-operative Plan: Extubation in OR  Informed Consent: I have reviewed the patients History and Physical, chart, labs and discussed the procedure including the risks, benefits and alternatives for the proposed anesthesia with the patient or authorized representative who has indicated  his/her understanding and acceptance.   Dental advisory given  Plan Discussed with: CRNA  Anesthesia Plan Comments:        Anesthesia Quick Evaluation

## 2017-07-04 ENCOUNTER — Encounter (HOSPITAL_COMMUNITY): Payer: Self-pay | Admitting: *Deleted

## 2017-07-04 ENCOUNTER — Ambulatory Visit (HOSPITAL_COMMUNITY): Payer: BLUE CROSS/BLUE SHIELD | Admitting: Certified Registered"

## 2017-07-04 ENCOUNTER — Other Ambulatory Visit: Payer: Self-pay

## 2017-07-04 ENCOUNTER — Encounter (HOSPITAL_COMMUNITY)
Admission: RE | Disposition: A | Discharge status: Home / Self Care | Payer: Self-pay | Source: Ambulatory Visit | Attending: Orthopaedic Surgery

## 2017-07-04 ENCOUNTER — Ambulatory Visit (HOSPITAL_COMMUNITY): Payer: BLUE CROSS/BLUE SHIELD | Admitting: Vascular Surgery

## 2017-07-04 ENCOUNTER — Ambulatory Visit (HOSPITAL_COMMUNITY)
Admission: RE | Admit: 2017-07-04 | Discharge: 2017-07-06 | Disposition: A | Discharge status: Home / Self Care | Payer: BLUE CROSS/BLUE SHIELD | Source: Ambulatory Visit | Attending: Orthopaedic Surgery | Admitting: Orthopaedic Surgery

## 2017-07-04 DIAGNOSIS — I10 Essential (primary) hypertension: Secondary | ICD-10-CM | POA: Insufficient documentation

## 2017-07-04 DIAGNOSIS — Z79899 Other long term (current) drug therapy: Secondary | ICD-10-CM | POA: Insufficient documentation

## 2017-07-04 DIAGNOSIS — M2242 Chondromalacia patellae, left knee: Secondary | ICD-10-CM | POA: Diagnosis not present

## 2017-07-04 DIAGNOSIS — M23222 Derangement of posterior horn of medial meniscus due to old tear or injury, left knee: Secondary | ICD-10-CM | POA: Diagnosis not present

## 2017-07-04 DIAGNOSIS — R2689 Other abnormalities of gait and mobility: Secondary | ICD-10-CM | POA: Diagnosis not present

## 2017-07-04 DIAGNOSIS — S83241D Other tear of medial meniscus, current injury, right knee, subsequent encounter: Secondary | ICD-10-CM

## 2017-07-04 DIAGNOSIS — R2681 Unsteadiness on feet: Secondary | ICD-10-CM | POA: Insufficient documentation

## 2017-07-04 DIAGNOSIS — M1711 Unilateral primary osteoarthritis, right knee: Secondary | ICD-10-CM

## 2017-07-04 DIAGNOSIS — K589 Irritable bowel syndrome without diarrhea: Secondary | ICD-10-CM | POA: Insufficient documentation

## 2017-07-04 DIAGNOSIS — K449 Diaphragmatic hernia without obstruction or gangrene: Secondary | ICD-10-CM | POA: Diagnosis not present

## 2017-07-04 DIAGNOSIS — S83241A Other tear of medial meniscus, current injury, right knee, initial encounter: Secondary | ICD-10-CM | POA: Diagnosis not present

## 2017-07-04 DIAGNOSIS — S83249A Other tear of medial meniscus, current injury, unspecified knee, initial encounter: Secondary | ICD-10-CM | POA: Diagnosis present

## 2017-07-04 DIAGNOSIS — G4730 Sleep apnea, unspecified: Secondary | ICD-10-CM | POA: Diagnosis not present

## 2017-07-04 DIAGNOSIS — K219 Gastro-esophageal reflux disease without esophagitis: Secondary | ICD-10-CM | POA: Diagnosis not present

## 2017-07-04 DIAGNOSIS — M23205 Derangement of unspecified medial meniscus due to old tear or injury, unspecified knee: Secondary | ICD-10-CM | POA: Diagnosis present

## 2017-07-04 HISTORY — PX: KNEE ARTHROSCOPY: SUR90

## 2017-07-04 HISTORY — PX: KNEE ARTHROSCOPY: SHX127

## 2017-07-04 SURGERY — ARTHROSCOPY, KNEE
Anesthesia: General | Site: Knee | Laterality: Right

## 2017-07-04 MED ORDER — DEXAMETHASONE SODIUM PHOSPHATE 4 MG/ML IJ SOLN
INTRAMUSCULAR | Status: DC | PRN
  Administered 2017-07-04: 5 mg via INTRAVENOUS

## 2017-07-04 MED ORDER — PANTOPRAZOLE SODIUM 40 MG PO TBEC
80.0000 mg | DELAYED_RELEASE_TABLET | Freq: Every day | ORAL | Status: DC
  Administered 2017-07-04 – 2017-07-06 (×3): 80 mg via ORAL
  Filled 2017-07-04 (×3): qty 2

## 2017-07-04 MED ORDER — LOSARTAN POTASSIUM 25 MG PO TABS
25.0000 mg | ORAL_TABLET | Freq: Every day | ORAL | Status: DC
  Administered 2017-07-05 – 2017-07-06 (×2): 25 mg via ORAL
  Filled 2017-07-04 (×2): qty 1

## 2017-07-04 MED ORDER — HYDROMORPHONE HCL 2 MG/ML IJ SOLN
INTRAMUSCULAR | Status: AC
  Filled 2017-07-04: qty 1

## 2017-07-04 MED ORDER — PROPOFOL 10 MG/ML IV BOLUS
INTRAVENOUS | Status: DC | PRN
  Administered 2017-07-04: 20 mg via INTRAVENOUS
  Administered 2017-07-04: 180 mg via INTRAVENOUS

## 2017-07-04 MED ORDER — ACETAMINOPHEN 500 MG PO TABS
1000.0000 mg | ORAL_TABLET | Freq: Four times a day (QID) | ORAL | Status: DC | PRN
  Administered 2017-07-06: 1000 mg via ORAL
  Filled 2017-07-04: qty 2

## 2017-07-04 MED ORDER — PROPOFOL 10 MG/ML IV BOLUS
INTRAVENOUS | Status: AC
Start: 2017-07-04 — End: ?
  Filled 2017-07-04: qty 40

## 2017-07-04 MED ORDER — SODIUM CHLORIDE 0.9 % IV SOLN
INTRAVENOUS | Status: DC
  Administered 2017-07-04: 12:00:00 via INTRAVENOUS

## 2017-07-04 MED ORDER — FENTANYL CITRATE (PF) 100 MCG/2ML IJ SOLN
INTRAMUSCULAR | Status: DC | PRN
  Administered 2017-07-04 (×3): 50 ug via INTRAVENOUS

## 2017-07-04 MED ORDER — MIDAZOLAM HCL 5 MG/5ML IJ SOLN
INTRAMUSCULAR | Status: DC | PRN
  Administered 2017-07-04: 2 mg via INTRAVENOUS

## 2017-07-04 MED ORDER — ONDANSETRON HCL 4 MG/2ML IJ SOLN: 4.0000 mg | Freq: Four times a day (QID) | INTRAMUSCULAR | Status: DC | PRN

## 2017-07-04 MED ORDER — CHLORHEXIDINE GLUCONATE 4 % EX LIQD: 60.0000 mL | Freq: Once | CUTANEOUS | Status: DC

## 2017-07-04 MED ORDER — ONDANSETRON HCL 4 MG/2ML IJ SOLN
INTRAMUSCULAR | Status: DC | PRN
  Administered 2017-07-04: 4 mg via INTRAVENOUS

## 2017-07-04 MED ORDER — SODIUM CHLORIDE 0.9 % IV SOLN: INTRAVENOUS | Status: DC

## 2017-07-04 MED ORDER — DOCUSATE SODIUM 100 MG PO CAPS
100.0000 mg | ORAL_CAPSULE | Freq: Two times a day (BID) | ORAL | Status: DC
  Administered 2017-07-04 – 2017-07-06 (×5): 100 mg via ORAL
  Filled 2017-07-04 (×5): qty 1

## 2017-07-04 MED ORDER — TAMSULOSIN HCL 0.4 MG PO CAPS: 0.4000 mg | ORAL_CAPSULE | Freq: Every day | ORAL | Status: DC | PRN

## 2017-07-04 MED ORDER — LIDOCAINE 2% (20 MG/ML) 5 ML SYRINGE
INTRAMUSCULAR | Status: DC | PRN
  Administered 2017-07-04: 100 mg via INTRAVENOUS

## 2017-07-04 MED ORDER — PROMETHAZINE HCL 25 MG/ML IJ SOLN: 6.2500 mg | INTRAMUSCULAR | Status: DC | PRN

## 2017-07-04 MED ORDER — LACTATED RINGERS IV SOLN
INTRAVENOUS | Status: DC | PRN
  Administered 2017-07-04: 07:00:00 via INTRAVENOUS

## 2017-07-04 MED ORDER — HYDROCHLOROTHIAZIDE 25 MG PO TABS
25.0000 mg | ORAL_TABLET | Freq: Every day | ORAL | Status: DC
  Administered 2017-07-04 – 2017-07-06 (×3): 25 mg via ORAL
  Filled 2017-07-04 (×3): qty 1

## 2017-07-04 MED ORDER — HYDROMORPHONE HCL 2 MG/ML IJ SOLN
0.3000 mg | INTRAMUSCULAR | Status: DC | PRN
  Administered 2017-07-04 (×4): 0.5 mg via INTRAVENOUS

## 2017-07-04 MED ORDER — MIDAZOLAM HCL 2 MG/2ML IJ SOLN
INTRAMUSCULAR | Status: AC
  Filled 2017-07-04: qty 2

## 2017-07-04 MED ORDER — LACTATED RINGERS IV SOLN: INTRAVENOUS | Status: DC

## 2017-07-04 MED ORDER — FENTANYL CITRATE (PF) 250 MCG/5ML IJ SOLN
INTRAMUSCULAR | Status: AC
Start: 2017-07-04 — End: ?
  Filled 2017-07-04: qty 5

## 2017-07-04 MED ORDER — OXYCODONE HCL 5 MG PO TABS: 5.0000 mg | ORAL_TABLET | ORAL | 0 refills | Status: DC | PRN

## 2017-07-04 MED ORDER — BUPIVACAINE-EPINEPHRINE (PF) 0.25% -1:200000 IJ SOLN
INTRAMUSCULAR | Status: AC
  Filled 2017-07-04: qty 30

## 2017-07-04 MED ORDER — OXYCODONE HCL 5 MG PO TABS
5.0000 mg | ORAL_TABLET | ORAL | Status: DC | PRN
  Administered 2017-07-04 – 2017-07-06 (×4): 10 mg via ORAL
  Filled 2017-07-04 (×4): qty 2

## 2017-07-04 MED ORDER — SODIUM CHLORIDE 0.9 % IR SOLN
Status: DC | PRN
  Administered 2017-07-04: 9000 mL

## 2017-07-04 MED ORDER — ONDANSETRON HCL 4 MG PO TABS: 4.0000 mg | ORAL_TABLET | Freq: Four times a day (QID) | ORAL | Status: DC | PRN

## 2017-07-04 MED ORDER — MEPERIDINE HCL 50 MG/ML IJ SOLN: 6.2500 mg | INTRAMUSCULAR | Status: DC | PRN

## 2017-07-04 MED ORDER — ACETAMINOPHEN 500 MG PO TABS
1000.0000 mg | ORAL_TABLET | Freq: Four times a day (QID) | ORAL | Status: AC
  Administered 2017-07-04 – 2017-07-05 (×4): 1000 mg via ORAL
  Filled 2017-07-04 (×4): qty 2

## 2017-07-04 MED ORDER — BUPIVACAINE-EPINEPHRINE 0.25% -1:200000 IJ SOLN
INTRAMUSCULAR | Status: DC | PRN
  Administered 2017-07-04: 30 mL

## 2017-07-04 MED ORDER — HYDROMORPHONE HCL 2 MG/ML IJ SOLN: 0.5000 mg | INTRAMUSCULAR | Status: DC | PRN

## 2017-07-04 SURGICAL SUPPLY — 32 items
BANDAGE ACE 6X5 VEL STRL LF (GAUZE/BANDAGES/DRESSINGS) ×2 IMPLANT
BLADE CUDA 5.5 (BLADE) IMPLANT
BLADE GREAT WHITE 4.2 (BLADE) ×2 IMPLANT
BUR OVAL 6.0 (BURR) IMPLANT
CUFF TOURNIQUET SINGLE 34IN LL (TOURNIQUET CUFF) IMPLANT
CUFF TOURNIQUET SINGLE 44IN (TOURNIQUET CUFF) IMPLANT
DRAPE ARTHROSCOPY W/POUCH 114 (DRAPES) ×2 IMPLANT
DRSG EMULSION OIL 3X3 NADH (GAUZE/BANDAGES/DRESSINGS) ×2 IMPLANT
DURAPREP 26ML APPLICATOR (WOUND CARE) ×2 IMPLANT
GAUZE SPONGE 4X4 12PLY STRL (GAUZE/BANDAGES/DRESSINGS) ×2 IMPLANT
GAUZE SPONGE 4X4 12PLY STRL LF (GAUZE/BANDAGES/DRESSINGS) ×1 IMPLANT
GLOVE BIOGEL PI IND STRL 8 (GLOVE) ×1 IMPLANT
GLOVE BIOGEL PI IND STRL 8.5 (GLOVE) ×1 IMPLANT
GLOVE BIOGEL PI INDICATOR 8 (GLOVE) ×1
GLOVE BIOGEL PI INDICATOR 8.5 (GLOVE) ×1
GLOVE ECLIPSE 8.0 STRL XLNG CF (GLOVE) ×2 IMPLANT
GLOVE ECLIPSE 8.5 STRL (GLOVE) ×2 IMPLANT
GOWN STRL REUS W/ TWL LRG LVL3 (GOWN DISPOSABLE) ×2 IMPLANT
GOWN STRL REUS W/ TWL XL LVL3 (GOWN DISPOSABLE) ×1 IMPLANT
GOWN STRL REUS W/TWL LRG LVL3 (GOWN DISPOSABLE) ×4
GOWN STRL REUS W/TWL XL LVL3 (GOWN DISPOSABLE) ×2
KIT TURNOVER KIT B (KITS) ×2 IMPLANT
MANIFOLD NEPTUNE II (INSTRUMENTS) ×2 IMPLANT
PACK ARTHROSCOPY DSU (CUSTOM PROCEDURE TRAY) ×2 IMPLANT
PAD ARMBOARD 7.5X6 YLW CONV (MISCELLANEOUS) ×4 IMPLANT
PADDING CAST COTTON 6X4 STRL (CAST SUPPLIES) ×1 IMPLANT
SET ARTHROSCOPY TUBING (MISCELLANEOUS) ×2
SET ARTHROSCOPY TUBING LN (MISCELLANEOUS) ×1 IMPLANT
SPONGE LAP 4X18 X RAY DECT (DISPOSABLE) ×2 IMPLANT
TOWEL OR 17X24 6PK STRL BLUE (TOWEL DISPOSABLE) ×4 IMPLANT
WAND HAND CNTRL MULTIVAC 90 (MISCELLANEOUS) ×2 IMPLANT
WATER STERILE IRR 1000ML POUR (IV SOLUTION) ×2 IMPLANT

## 2017-07-04 NOTE — Transfer of Care (Signed)
Immediate Anesthesia Transfer of Care Note  Patient: John Keller  Procedure(s) Performed: RIGHT KNEE ARTHROSCOPY WITH DEBRIDEMENT, MEDIAL MENISCECTOMY (Right Knee)  Patient Location: PACU  Anesthesia Type:General  Level of Consciousness: awake, alert , oriented and patient cooperative  Airway & Oxygen Therapy: Patient Spontanous Breathing and Patient connected to face mask oxygen  Post-op Assessment: Report given to RN and Post -op Vital signs reviewed and stable  Post vital signs: Reviewed and stable  Last Vitals:  Vitals Value Taken Time  BP 118/82 07/04/2017  8:21 AM  Temp    Pulse 68 07/04/2017  8:22 AM  Resp 13 07/04/2017  8:22 AM  SpO2 100 % 07/04/2017  8:22 AM  Vitals shown include unvalidated device data.  Last Pain:  Vitals:   07/04/17 0556  PainSc: 6       Patients Stated Pain Goal: 3 (31/54/00 8676)  Complications: No apparent anesthesia complications

## 2017-07-04 NOTE — H&P (Signed)
The recent History & Physical has been reviewed. I have personally examined the patient today. There is no interval change to the documented History & Physical. The patient would like to proceed with the procedure.  Garald Balding 07/04/2017,  7:11 AM

## 2017-07-04 NOTE — Anesthesia Procedure Notes (Signed)
Procedure Name: LMA Insertion Date/Time: 07/04/2017 7:20 AM Performed by: Cleda Daub, CRNA Pre-anesthesia Checklist: Patient identified, Emergency Drugs available, Suction available and Patient being monitored Patient Re-evaluated:Patient Re-evaluated prior to induction Oxygen Delivery Method: Circle system utilized Preoxygenation: Pre-oxygenation with 100% oxygen Induction Type: IV induction LMA: LMA inserted LMA Size: 5.0 Number of attempts: 1 Placement Confirmation: positive ETCO2 and breath sounds checked- equal and bilateral Tube secured with: Tape Dental Injury: Teeth and Oropharynx as per pre-operative assessment

## 2017-07-04 NOTE — Progress Notes (Signed)
PATIENT ID:      John Keller  MRN:     [id number] DOB/AGE:    1956-02-21 / 62 y.o.       OPERATIVE REPORT    DATE OF PROCEDURE:  07/04/2017       PREOPERATIVE DIAGNOSIS:   TEAR MEDIAL MENISCUS RIGHT KNEE WITH BICOMPARTMENTAL DEGENERATIVE ARTHRITIS                                                       Estimated body mass index is 35.81 kg/m as calculated from the following:   Height as of this encounter: 5' 8.5" (1.74 m).   Weight as of this encounter: 239 lb (108.4 kg).     POSTOPERATIVE DIAGNOSIS:   SAME                                                                   Estimated body mass index is 35.81 kg/m as calculated from the following:   Height as of this encounter: 5' 8.5" (1.74 m).   Weight as of this encounter: 239 lb (108.4 kg).     PROCEDURE:  Procedure(s): RIGHT KNEE ARTHROSCOPY WITH DEBRIDEMENTOF PATELLA AND MEDIAL COMPARTMENT,PARTIAL MEDIAL MENISCECTOMY      SURGEON:  Joni Fears, MD    ASSISTANT:  NONE       ANESTHESIA: local and general     DRAINS: none :      TOURNIQUET TIME: * No tourniquets in log *    COMPLICATIONS:  None   CONDITION:  stable  PROCEDURE IN DETAIL: [place] 07/04/2017, 8:19 AM  Patient ID: John Keller, male   DOB: 25-Dec-1955, 62 y.o.   MRN: [id number]
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
## 2017-07-04 NOTE — Anesthesia Postprocedure Evaluation (Signed)
Anesthesia Post Note  Patient: ADD DINAPOLI  Procedure(s) Performed: RIGHT KNEE ARTHROSCOPY WITH DEBRIDEMENT, MEDIAL MENISCECTOMY (Right Knee)     Patient location during evaluation: PACU Anesthesia Type: General Level of consciousness: awake and alert Pain management: pain level controlled Vital Signs Assessment: post-procedure vital signs reviewed and stable Respiratory status: spontaneous breathing, nonlabored ventilation, respiratory function stable and patient connected to nasal cannula oxygen Cardiovascular status: blood pressure returned to baseline and stable Postop Assessment: no apparent nausea or vomiting Anesthetic complications: no    Last Vitals:  Vitals:   07/04/17 1015 07/04/17 1031  BP: 122/75 127/72  Pulse: 74 70  Resp: 14 16  Temp:  36.4 C  SpO2: 99% 100%    Last Pain:  Vitals:   07/04/17 1031  TempSrc: Oral  PainSc:                  Effie Berkshire

## 2017-07-04 NOTE — Progress Notes (Signed)
Patient refused CPAP at this time. Encouraged to call for  Respiratory if he desires to use CPAP during hospital stay.

## 2017-07-05 ENCOUNTER — Encounter (HOSPITAL_COMMUNITY): Payer: Self-pay | Admitting: Orthopaedic Surgery

## 2017-07-05 DIAGNOSIS — R2689 Other abnormalities of gait and mobility: Secondary | ICD-10-CM | POA: Diagnosis not present

## 2017-07-05 DIAGNOSIS — G4730 Sleep apnea, unspecified: Secondary | ICD-10-CM | POA: Diagnosis not present

## 2017-07-05 DIAGNOSIS — K589 Irritable bowel syndrome without diarrhea: Secondary | ICD-10-CM | POA: Diagnosis not present

## 2017-07-05 DIAGNOSIS — K449 Diaphragmatic hernia without obstruction or gangrene: Secondary | ICD-10-CM | POA: Diagnosis not present

## 2017-07-05 DIAGNOSIS — K219 Gastro-esophageal reflux disease without esophagitis: Secondary | ICD-10-CM | POA: Diagnosis not present

## 2017-07-05 DIAGNOSIS — M23222 Derangement of posterior horn of medial meniscus due to old tear or injury, left knee: Secondary | ICD-10-CM | POA: Diagnosis not present

## 2017-07-05 DIAGNOSIS — M1711 Unilateral primary osteoarthritis, right knee: Secondary | ICD-10-CM | POA: Diagnosis not present

## 2017-07-05 DIAGNOSIS — M2242 Chondromalacia patellae, left knee: Secondary | ICD-10-CM | POA: Diagnosis not present

## 2017-07-05 DIAGNOSIS — Z79899 Other long term (current) drug therapy: Secondary | ICD-10-CM | POA: Diagnosis not present

## 2017-07-05 DIAGNOSIS — I10 Essential (primary) hypertension: Secondary | ICD-10-CM | POA: Diagnosis not present

## 2017-07-05 DIAGNOSIS — R2681 Unsteadiness on feet: Secondary | ICD-10-CM | POA: Diagnosis not present

## 2017-07-05 NOTE — Clinical Social Work Note (Signed)
Clinical Social Work Assessment  Patient Details  Name: John Keller MRN: [id number] Date of Birth: 1955-08-25  Date of referral:  07/05/17               Reason for consult:  Facility Placement                Permission sought to share information with:  Facility Art therapist granted to share information::  Yes, Verbal Permission Granted  Name::        Agency::  SNF  Relationship::     Contact Information:     Housing/Transportation Living arrangements for the past 2 months:  Apartment Source of Information:  Patient Patient Interpreter Needed:  None Criminal Activity/Legal Involvement Pertinent to Current Situation/Hospitalization:  No - Comment as needed Significant Relationships:  Siblings, Parents, Other Family Members Lives with:  Self Do you feel safe going back to the place where you live?  No Need for family participation in patient care:  No (Coment)  Care giving concerns:  Pt has knee impairment and will need skilled nursing.  Social Worker assessment / plan:  CSW met with patient at bedside to discuss SNF placement as patient did not work well with therapy in addition, pt reported that he has 10 steps to get in to home. Pt was unsure if he wants to go home or to SNF. CSW validated and explained the physical limitations. Pt indicated that he works 2 jobs and was very busy prior to hospitalization. CSW obtained permission to send to SNF's. CSW explained the SNF process, placement, and insurance auth.   CSW will f/u for disposition.   Employment status:  [employer] information:  Other (Comment Required)(BCBS) PT Recommendations:  [place] / Referral to community resources:  [place]  Patient/Family's Response to care:  Pt appreciative of CSW meeting to discuss disposition. Pt agreeable to SNF and desires to return home if he improves.  Patient/Family's Understanding of and Emotional Response to  Diagnosis, Current Treatment, and Prognosis:  Pt has good understanding of diagnosis and has flat emotional response to the plan for SNF. Pt appears to want to return home. Pt desires to return home to independence. Pt allowed CSW to send to SNF's to see if he can go with authorization.  CSW will f/u for disposition.  Emotional Assessment Appearance:  Appears stated age Attitude/Demeanor/Rapport:  (Cooperative) Affect (typically observed):  Accepting, Appropriate Orientation:  Oriented to Self, Oriented to Place, Oriented to  Time, Oriented to Situation Alcohol / Substance use:  Not Applicable Psych involvement (Current and /or in the community):  No (Comment)  Discharge Needs  Concerns to be addressed:  Discharge Planning Concerns Readmission within the last 30 days:  No Current discharge risk:  Physical Impairment, Lives alone, Dependent with Mobility Barriers to Discharge:  Other(Need SNF offers and Insurance Auth)   Normajean Baxter, LCSW 07/05/2017, 2:18 PM

## 2017-07-05 NOTE — Progress Notes (Signed)
Patient refused CPAP at this time.
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
## 2017-07-05 NOTE — Progress Notes (Signed)
PATIENT ID: John Keller        MRN:  [id number]          DOB/AGE: [birth year] / 62 y.o.    Joni Fears, MD   Biagio Borg, PA-C [address] [place], [place]  [address]                             [phone]   PROGRESS NOTE  Subjective:  negative for Chest Pain  negative for Shortness of Breath  negative for Nausea/Vomiting   negative for Calf Pain    Tolerating Diet: yes         Patient reports pain as mild.     Comfortable this am  Objective: Vital signs in last 24 hours:    Patient Vitals for the past 24 hrs:  BP Temp Temp src Pulse Resp SpO2  07/05/17 0324 (!) 138/92 97.7 F (36.5 C) Oral 67 16 97 %  07/04/17 2330 130/82 97.8 F (36.6 C) Oral 75 16 98 %  07/04/17 1949 137/87 98.1 F (36.7 C) Oral 86 18 96 %  07/04/17 1507 131/77 98.1 F (36.7 C) Oral 81 17 95 %  07/04/17 1031 127/72 97.6 F (36.4 C) Oral 70 16 100 %  07/04/17 1015 122/75 - - 74 14 99 %  07/04/17 1000 122/76 97.9 F (36.6 C) - 75 12 (!) 86 %  07/04/17 0945 127/76 - - 70 (!) 21 94 %  07/04/17 0930 124/74 - - 73 14 97 %  07/04/17 0915 124/69 - - 70 20 96 %  07/04/17 0900 109/74 - - 72 11 97 %  07/04/17 0845 108/69 - - 65 18 99 %  07/04/17 0830 106/71 - - 75 17 99 %  07/04/17 0820 118/82 97.6 F (36.4 C) - 75 15 100 %      Intake/Output from previous day:   05/07 0701 - 05/08 0700 In: 1396.3 [P.O.:600; I.V.:796.3] Out: 2    Intake/Output this shift:   No intake/output data recorded.   Intake/Output      05/07 0701 - 05/08 0700   P.O. 600   I.V. (mL/kg) 796.3 (7.3)   Total Intake(mL/kg) 1396.3 (12.9)   Urine (mL/kg/hr) 0 (0)   Blood 2   Total Output 2   Net +1394.3       Urine Occurrence 2 x      LABORATORY DATA: No results for input(s): WBC, HGB, HCT, PLT in the last 168 hours. No results for input(s): NA, K, CL, CO2, BUN, CREATININE, GLUCOSE, CALCIUM in the last 168 hours. Lab Results  Component Value Date   INR 1.08 01/18/2015   INR 1.16 05/06/2011    Recent  Radiographic Studies :  No results found.   Examination:  General appearance: alert, cooperative and no distress  Wound Exam: clean, dry, intact   Drainage:  None: wound tissue dry  Motor Exam: EHL, FHL, Anterior Tibial and Posterior Tibial Intact  Sensory Exam: Superficial Peroneal, Deep Peroneal and Tibial normal  Vascular Exam: Normal  Assessment:    1 Day Post-Op  Procedure(s) (LRB): RIGHT KNEE ARTHROSCOPY WITH DEBRIDEMENT, MEDIAL MENISCECTOMY (Right)  ADDITIONAL DIAGNOSIS:  Active Problems:   Torn medial meniscus right knee   Unilateral primary osteoarthritis, right knee   Degenerative tear of medial meniscus     Pl   DISCHARGE PLAN: Home  DISCHARGE NEEDS: has crutches D/C IV, OOB WBAT-dressing change this am-discharge to home with F/U next  week        Biagio Borg, Hershal Coria Chical  07/05/2017 6:50 AM  Patient ID: [name], male   DOB: November 06, 1955, 62 y.o.   MRN: [id number]

## 2017-07-05 NOTE — Social Work (Signed)
CSW met with patient at bedside to discuss SNF offers.   Pt desires to return home however has selected [facility] as first choice.  CSW will f/u with SNF to confirm bed.  Elissa Hefty, LCSW Clinical Social Worker [phone]

## 2017-07-05 NOTE — Social Work (Addendum)
CSW met with patient to discuss disposition at bedside to assist with transportation. In exploring his home support, CSW inquired of his ability to get in the home independently. Pt shared that he has 3 steps to get into building and 7 steps to get into apartment.  CSW advised floor RN to f/u with doctor as PT may need to work with him on stairs prior to CSW providing transport assistance.  Elissa Hefty, LCSW Clinical Social Worker [phone]

## 2017-07-05 NOTE — Progress Notes (Signed)
Occupational Therapy Evaluation Patient Details Name: John Keller MRN: [id number] DOB: [birth year] Today's Date: 07/05/2017    History of Present Illness Pt is a 62 y/o male s/p R knee arthroscopy and partial medial meniscectomy. PMH includes OSA on CPAP and HTN.    Clinical Impression   This 62 y/o male presents with the above. Pt lives alone, reports at baseline he is independent with ADLs and functional mobility. Pt currently requiring MinA for room level functional mobility using RW; requires ModA for LB ADLs. Pt will benefit from continued acute OT services and due to limited caregiver support at time of discharge feel pt will benefit from continued OT services in SNF setting prior to return home to maximize his safety and independence with ADLs and mobility.     Follow Up Recommendations  SNF;Supervision/Assistance - 24 hour    Equipment Recommendations  Other (comment)(TBD in next venue )           Precautions / Restrictions Precautions Precautions: Fall Restrictions Weight Bearing Restrictions: Yes RLE Weight Bearing: Partial weight bearing RLE Partial Weight Bearing Percentage or Pounds: 50      Mobility Bed Mobility               General bed mobility comments: OOB in recliner   Transfers Overall transfer level: Needs assistance Equipment used: Rolling walker (2 wheeled) Transfers: Sit to/from Stand Sit to Stand: Min assist         General transfer comment: assist to rise and steady at [name]; VCs for hand and LE placement to decrease wt on RLE when returning to sitting     Balance Overall balance assessment: Needs assistance Sitting-balance support: No upper extremity supported;Feet supported Sitting balance-[name] Scale: Fair     Standing balance support: Bilateral upper extremity supported;During functional activity Standing balance-[name] Scale: Poor Standing balance comment: reliant on UE support.                            ADL either  performed or assessed with clinical judgement   ADL Overall ADL's : Needs assistance/impaired Eating/Feeding: Modified independent;Sitting   Grooming: Set up;Sitting   Upper Body Bathing: Min guard;Sitting   Lower Body Bathing: Minimal assistance;Sitting/lateral leans;Sit to/from stand   Upper Body Dressing : Min guard;Sitting   Lower Body Dressing: Minimal assistance;Sit to/from stand Lower Body Dressing Details (indicate cue type and reason): pt able to reach towards RLE and adjust sock with increased time/effort  Toilet Transfer: Minimal assistance;Ambulation;BSC;RW Toilet Transfer Details (indicate cue type and reason): BSC over toilet; simulated in transfer to [name] and Hygiene: Minimal assistance;Sit to/from stand       Functional mobility during ADLs: Min guard;Minimal assistance;Rolling walker                           Pertinent Vitals/Pain Pain Assessment: Faces Faces Pain Scale: Hurts even more Pain Location: R knee  Pain Descriptors / Indicators: Aching;Operative site guarding Pain Intervention(s): Limited activity within patient's tolerance;Monitored during session;Repositioned     Hand Dominance Right   Extremity/Trunk Assessment Upper Extremity Assessment Upper Extremity Assessment: Overall WFL for tasks assessed   Lower Extremity Assessment Lower Extremity Assessment: Defer to PT evaluation       Communication Communication Communication: No difficulties   Cognition Arousal/Alertness: Awake/alert Behavior During Therapy: WFL for tasks assessed/performed Overall Cognitive Status: Within Functional Limits for tasks assessed  Home Living Family/patient expects to be discharged to:: Private residence Living Arrangements: Alone Available Help at Discharge: Other (Comment)(reports no one ) Type of Home: Apartment Home Access: Stairs  to enter Entrance Stairs-Number of Steps: 10 Entrance Stairs-Rails: None Home Layout: One level     Bathroom Shower/Tub: Teacher, early years/pre: Standard     Home Equipment: Crutches          Prior Functioning/Environment Level of Independence: Independent                 OT Problem List: Decreased strength;Impaired balance (sitting and/or standing);Pain;Decreased range of motion;Decreased activity tolerance;Decreased knowledge of use of DME or AE      OT Treatment/Interventions: Self-care/ADL training;DME and/or AE instruction;Therapeutic activities;Balance training;Therapeutic exercise;Patient/family education    OT Goals(Current goals can be found in the care plan section) Acute Rehab OT Goals Patient Stated Goal: to be able to walk better  OT Goal Formulation: With patient Time For Goal Achievement: 07/19/17 Potential to Achieve Goals: Good  OT Frequency: Min 2X/week   Barriers to D/C: Decreased caregiver support                        AM-PAC PT "6 Clicks" Daily Activity     Outcome Measure Help from another person eating meals?: None Help from another person taking care of personal grooming?: A Little Help from another person toileting, which includes using toliet, bedpan, or urinal?: A Lot Help from another person bathing (including washing, rinsing, drying)?: A Lot Help from another person to put on and taking off regular upper body clothing?: None Help from another person to put on and taking off regular lower body clothing?: A Lot 6 Click Score: 17   End of Session Equipment Utilized During Treatment: Gait belt;Rolling walker Nurse Communication: Mobility status  Activity Tolerance: Patient tolerated treatment well Patient left: in chair;with call bell/phone within reach  OT Visit Diagnosis: Unsteadiness on feet (R26.81);Pain Pain - Right/Left: Right Pain - part of body: Knee                Time: 1555-1610 OT Time Calculation  (min): 15 min Charges:  OT General Charges $OT Visit: 1 Visit OT Evaluation $OT Eval Low Complexity: 1 Low G-Codes:     [name], OT Pager [phone] 07/05/2017   Raymondo Band 07/05/2017, 4:47 PM

## 2017-07-05 NOTE — Plan of Care (Signed)
  Problem: Education: Goal: Knowledge of General Education information will improve Outcome: Progressing   Problem: Health Behavior/Discharge Planning: Goal: Ability to manage health-related needs will improve Outcome: Progressing   Problem: Activity: Goal: Risk for activity intolerance will decrease Outcome: Progressing   Problem: Nutrition: Goal: Adequate nutrition will be maintained Outcome: Progressing   Problem: Coping: Goal: Level of anxiety will decrease Outcome: Progressing   Problem: Elimination: Goal: Will not experience complications related to bowel motility Outcome: Progressing Goal: Will not experience complications related to urinary retention Outcome: Progressing   Problem: Pain Managment: Goal: General experience of comfort will improve Outcome: Progressing   Problem: Safety: Goal: Ability to remain free from injury will improve Outcome: Progressing   Problem: Skin Integrity: Goal: Risk for impaired skin integrity will decrease Outcome: Progressing

## 2017-07-05 NOTE — Evaluation (Signed)
Physical Therapy Evaluation Patient Details Name: John Keller MRN: [id number] DOB: 05-02-1955 Today's Date: 07/05/2017   History of Present Illness  Pt is a 62 y/o male s/p R knee arthroscopy and partial medial meniscectomy. PMH includes OSA on CPAP and HTN.   Clinical Impression  Pt is s/p surgery above with deficits below. Pt limited in gait tolerance secondary to pain. Unsteady with use of crutches, requiring min A. Improved steadiness noted with use of RW, requiring min to min guard A. Feel pt is currently a high fall risk given current deficits. Pt reports he lives alone and does not have assist. Concerned about pt being able to manage stairs at home, as pt very unsteady with crutches and does not have anyone to assist with stair management with use of RW. Will continue to follow acutely to maximize functional mobility independence and safety.     Follow Up Recommendations SNF;Supervision for mobility/OOB    Equipment Recommendations  Rolling walker with 5" wheels;3in1 (PT)    Recommendations for Other Services OT consult     Precautions / Restrictions Precautions Precautions: Fall Restrictions Weight Bearing Restrictions: Yes RLE Weight Bearing: Partial weight bearing RLE Partial Weight Bearing Percentage or Pounds: 50      Mobility  Bed Mobility Overal bed mobility: Needs Assistance Bed Mobility: Supine to Sit     Supine to sit: Supervision     General bed mobility comments: Supervision for safety.   Transfers Overall transfer level: Needs assistance Equipment used: Rolling walker (2 wheeled);Crutches Transfers: Sit to/from Stand Sit to Stand: Min assist;Mod assist         General transfer comment: Min A for lift assist and steadying using RW. Mod A for lift assist and steadying using crutches. Verbal cues for safe hand placement.   Ambulation/Gait Ambulation/Gait assistance: Min assist;Min guard Ambulation Distance (Feet): 50 Feet Assistive device: Rolling  walker (2 wheeled);Crutches Gait Pattern/deviations: Step-to pattern;Decreased step length - right;Decreased step length - left;Decreased weight shift to right;Antalgic;Trunk flexed Gait velocity: Decreased  Gait velocity interpretation: <1.8 ft/sec, indicate of risk for recurrent falls General Gait Details: Slow, antalgic gait. Attempted gait with crutches in room, however, pt very unsteady. Improved steadiness noted with RW and required min to min guard for steadying. Limited weight shift to the RLE. Distance limited by pain   Stairs            Wheelchair Mobility    Modified Rankin (Stroke Patients Only)       Balance Overall balance assessment: Needs assistance Sitting-balance support: No upper extremity supported;Feet supported Sitting balance-[name] Scale: Fair     Standing balance support: Bilateral upper extremity supported;During functional activity Standing balance-[name] Scale: Poor Standing balance comment: Heavily reliant on UE support.                              Pertinent Vitals/Pain Pain Assessment: Faces Faces Pain Scale: Hurts whole lot Pain Location: R knee  Pain Descriptors / Indicators: Aching;Operative site guarding Pain Intervention(s): Limited activity within patient's tolerance;Monitored during session;Repositioned    Home Living Family/patient expects to be discharged to:: Private residence Living Arrangements: Alone Available Help at Discharge: Other (Comment)(reports no one ) Type of Home: Apartment Home Access: Stairs to enter Entrance Stairs-Rails: None Entrance Stairs-Number of Steps: 10(3, and then 7) Home Layout: One level Home Equipment: Crutches      Prior Function Level of Independence: Independent  Hand Dominance   Dominant Hand: Right    Extremity/Trunk Assessment   Upper Extremity Assessment Upper Extremity Assessment: Defer to OT evaluation    Lower Extremity Assessment Lower  Extremity Assessment: RLE deficits/detail RLE Deficits / Details: Deficits consistent with post op pain and weakness.        Communication   Communication: No difficulties  Cognition Arousal/Alertness: Awake/alert Behavior During Therapy: WFL for tasks assessed/performed Overall Cognitive Status: Within Functional Limits for tasks assessed                                        General Comments      Exercises     Assessment/Plan    PT Assessment Patient needs continued PT services  PT Problem List Decreased strength;Decreased balance;Decreased activity tolerance;Decreased mobility;Decreased knowledge of use of DME;Decreased knowledge of precautions;Pain       PT Treatment Interventions DME instruction;Gait training;Stair training;Functional mobility training;Therapeutic activities;Therapeutic exercise;Balance training;Patient/family education    PT Goals (Current goals can be found in the Care Plan section)  Acute Rehab PT Goals Patient Stated Goal: to be able to walk better  PT Goal Formulation: With patient Time For Goal Achievement: 07/19/17 Potential to Achieve Goals: Good    Frequency Min 5X/week   Barriers to discharge Decreased caregiver support Lives alone and no one available to assist.     Co-evaluation               AM-PAC PT "6 Clicks" Daily Activity  Outcome Measure Difficulty turning over in bed (including adjusting bedclothes, sheets and blankets)?: A Little Difficulty moving from lying on back to sitting on the side of the bed? : A Little Difficulty sitting down on and standing up from a chair with arms (e.g., wheelchair, bedside commode, etc,.)?: Unable Help needed moving to and from a bed to chair (including a wheelchair)?: A Little Help needed walking in hospital room?: A Little Help needed climbing 3-5 steps with a railing? : A Lot 6 Click Score: 15    End of Session Equipment Utilized During Treatment: Gait  belt Activity Tolerance: Patient limited by pain Patient left: in chair;with call bell/phone within reach Nurse Communication: Mobility status PT Visit Diagnosis: Other abnormalities of gait and mobility (R26.89);Pain;Difficulty in walking, not elsewhere classified (R26.2) Pain - Right/Left: Right Pain - part of body: Knee    Time: 0762-2633 PT Time Calculation (min) (ACUTE ONLY): 28 min   Charges:   PT Evaluation $PT Eval Low Complexity: 1 Low PT Treatments $Gait Training: 8-22 mins   PT G Codes:        Leighton Ruff, PT, DPT  Acute Rehabilitation Services  Pager: [phone]   Rudean Hitt 07/05/2017, 12:41 PM

## 2017-07-06 ENCOUNTER — Other Ambulatory Visit (INDEPENDENT_AMBULATORY_CARE_PROVIDER_SITE_OTHER): Payer: Self-pay | Admitting: Orthopaedic Surgery

## 2017-07-06 DIAGNOSIS — M2242 Chondromalacia patellae, left knee: Secondary | ICD-10-CM | POA: Diagnosis not present

## 2017-07-06 DIAGNOSIS — K589 Irritable bowel syndrome without diarrhea: Secondary | ICD-10-CM | POA: Diagnosis not present

## 2017-07-06 DIAGNOSIS — R2689 Other abnormalities of gait and mobility: Secondary | ICD-10-CM | POA: Diagnosis not present

## 2017-07-06 DIAGNOSIS — Z79899 Other long term (current) drug therapy: Secondary | ICD-10-CM | POA: Diagnosis not present

## 2017-07-06 DIAGNOSIS — M1711 Unilateral primary osteoarthritis, right knee: Secondary | ICD-10-CM | POA: Diagnosis not present

## 2017-07-06 DIAGNOSIS — I10 Essential (primary) hypertension: Secondary | ICD-10-CM | POA: Diagnosis not present

## 2017-07-06 DIAGNOSIS — K219 Gastro-esophageal reflux disease without esophagitis: Secondary | ICD-10-CM | POA: Diagnosis not present

## 2017-07-06 DIAGNOSIS — K449 Diaphragmatic hernia without obstruction or gangrene: Secondary | ICD-10-CM | POA: Diagnosis not present

## 2017-07-06 DIAGNOSIS — R2681 Unsteadiness on feet: Secondary | ICD-10-CM | POA: Diagnosis not present

## 2017-07-06 DIAGNOSIS — M23222 Derangement of posterior horn of medial meniscus due to old tear or injury, left knee: Secondary | ICD-10-CM | POA: Diagnosis not present

## 2017-07-06 DIAGNOSIS — S83249A Other tear of medial meniscus, current injury, unspecified knee, initial encounter: Secondary | ICD-10-CM | POA: Diagnosis not present

## 2017-07-06 DIAGNOSIS — G4730 Sleep apnea, unspecified: Secondary | ICD-10-CM | POA: Diagnosis not present

## 2017-07-06 MED ORDER — LIDOCAINE HCL (PF) 1 % IJ SOLN
INTRAMUSCULAR | Status: AC
  Administered 2017-07-06: 5 mL via INTRADERMAL
  Filled 2017-07-06: qty 5

## 2017-07-06 MED ORDER — OXYCODONE-ACETAMINOPHEN 5-325 MG PO TABS: 1.0000 | ORAL_TABLET | ORAL | 0 refills | Status: DC | PRN

## 2017-07-06 MED ORDER — LIDOCAINE HCL 1 % IJ SOLN
5.0000 mL | Freq: Once | INTRAMUSCULAR | Status: AC
Start: 2017-07-06 — End: 2017-07-06
  Administered 2017-07-06: 5 mL via INTRADERMAL

## 2017-07-06 MED FILL — OXYCODONE-ACETAMINOPHEN 5-3: 5-325 | 4 days supply | Qty: 40 | Fill #0

## 2017-07-06 NOTE — Discharge Summary (Signed)
John Fears, MD   John Borg, PA-C [address], [place], [place]  [address]                             [phone]  PATIENT ID: John Keller        MRN:  [id number]          DOB/AGE: Dec 10, 1955 / 62 y.o.    DISCHARGE SUMMARY  ADMISSION DATE:    07/04/2017 DISCHARGE DATE:   07/06/2017   ADMISSION DIAGNOSIS: TORN MEDIAL MENISCUS RIGHT KNEE    DISCHARGE DIAGNOSIS:  TORN MEDIAL MENISCUS RIGHT KNEE    ADDITIONAL DIAGNOSIS: Active Problems:   Torn medial meniscus right knee   Unilateral primary osteoarthritis, right knee   Degenerative tear of medial meniscus  Past Medical History:  Diagnosis Date  . Anemia 2009   noted during hospitalization for surgical I&D of chest, arm abscess  . Anxiety   . Appendicitis   . Arthritis   . Depression   . Dyspnea    with anxiety and some exertion  . Gastric tumor 2013   resection  . GERD (gastroesophageal reflux disease)   . GI bleed 2013  . H/O seasonal allergies   . History of hiatal hernia   . Hypertension   . IBS (irritable bowel syndrome)   . Pneumonia   . Sleep apnea    has a cpap machine but seldom uses it.     PROCEDURE: Procedure(s): RIGHT KNEE ARTHROSCOPY WITH DEBRIDEMENT, MEDIAL MENISCECTOMY  on 07/04/2017  CONSULTS: none    HISTORY: John Keller is a 62 year old male who has been having trouble off and on with his right knee for many years. In 2014 he had an MRI scan that revealed a possible tear of the medial meniscus at its root. He tried exercises and even had a shot of cortisone seemed to to improve only to have recurrence of his pain after an injury earlier this year. He's been having pain medially with stiffness soreness and achiness. MRI scan had some clicking but no sensation of his knee giving way. He works several jobs and is up and down over the course of the day and his knee interferes with his activities. There has been some swelling. No numbness or tingling. No back pain. No hip discomfort.  An  intra-articular cortisone injection was performed On October 28, 2016 of which she had several weeks of relief but then had recurrent pain and discomfort.  An MRI scan was ordered.  At that time he was noted to aggressive radial tearing of the meniscal root and progressive extrusion of the medial meniscus from the joint.  There is also progressive degenerative tearing throughout the remainder of the posterior horn and body of the meniscus.  No displaced meniscal fragments identified.  Progressive chondral thinning of the medial compartment was identified but the lateral compartment was relatively preserved.  A discussion of treatment plans ensued and it was felt that an arthroscopic debridement was indicated.    HOSPITAL COURSE:  John Keller is a 62 y.o. admitted on 07/04/2017 and found to have a diagnosis of [place].  After appropriate laboratory studies were obtained  they were taken to the operating room on 07/04/2017 and underwent  Procedure(s): RIGHT KNEE ARTHROSCOPY WITH DEBRIDEMENT, MEDIAL MENISCECTOMY  .   They were given perioperative antibiotics:  Anti-infectives (From admission, onward)   None    .  Tolerated the procedure well.  Kept overnight because of sleep apnea. POD #1, allowed out of bed to a chair.  PT for ambulation.  Failed PT and therapist stated he could not go home without improvement in ambulation. FL2 ordered.  POD #2, continued PT and ambulation. Improved after aspiration of joint of 70 cc blood.   The remainder of the hospital course was dedicated to ambulation and strengthening.   The patient was discharged on 2 Days Post-Op in  Stable condition.  Blood products given:none  DIAGNOSTIC STUDIES: Recent vital signs:  Patient Vitals for the past 24 hrs:  BP Temp Temp src Pulse Resp SpO2  07/06/17 1428 127/73 98.2 F (36.8 C) Oral 79 17 99 %  07/05/17 2244 138/83 99.1 F (37.3 C) Oral 83 16 98 %       Recent laboratory studies: No results  for input(s): WBC, HGB, HCT, PLT in the last 168 hours. No results for input(s): NA, K, CL, CO2, BUN, CREATININE, GLUCOSE, CALCIUM in the last 168 hours. Lab Results  Component Value Date   INR 1.08 01/18/2015   INR 1.16 05/06/2011     Recent Radiographic Studies :  No results found.  DISCHARGE INSTRUCTIONS: Discharge Instructions    Call MD / Call 911   Complete by:  As directed    If you experience chest pain or shortness of breath, CALL 911 and be transported to the hospital emergency room.  If you develop a fever above 101 F, pus (white drainage) or increased drainage or redness at the wound, or calf pain, call your surgeon's office.   Change dressing   Complete by:  As directed    Change dressing onThursday, then change the dressing daily with sterile bandaids.  You may clean the incision with alcohol prior to redressing.   Constipation Prevention   Complete by:  As directed    Drink plenty of fluids.  Prune juice and/or coffee may be helpful.  You may use a stool softener, such as Colace (over the counter) 100 mg twice a day.  Use MiraLax (over the counter) for constipation as needed but this may take several days to work.  Mag Citrate --OR-- Milk of Magnesia --OR -- Dulcolax pills/suppositories may also be used but follow directions on the label.   Diet general   Complete by:  As directed    Discharge instructions   Complete by:  As directed    YOU WERE GIVEN A DEVICE CALLED AN INCENTIVE SPIROMETER TO HELP YOU TAKE DEEP BREATHS.  PLEASE USE THIS AT LEAST TEN (10) TIMES EVERY 1-2 HOURS EVERY DAY TO PREVENT PNEUMONIA.   Driving restrictions   Complete by:  As directed    No driving for 2 weeks. Discuss at [place] op visit with Dr Durward Fortes   Increase activity slowly as tolerated   Complete by:  As directed    Lifting restrictions   Complete by:  As directed    No lifting for 6 weeks   Partial weight bearing   Complete by:  As directed    50 % WEIGHT BEARING AS TAUGHT IN PHYSICAL  THERAPY   % Body Weight:  As discussed with Dr Durward Fortes   Laterality:  right   Extremity:  Lower      DISCHARGE MEDICATIONS:   Allergies as of 07/06/2017   No Known Allergies     Medication List    STOP taking these medications   naproxen sodium 220 MG tablet Commonly known as:  ALEVE     TAKE these medications   acetaminophen 500 MG tablet Commonly known as:  TYLENOL Take 1,000 mg by mouth every 6 (six) hours as needed (pain). Reported on 08/05/2015   hydrochlorothiazide 25 MG tablet Commonly known as:  HYDRODIURIL Take 1 tablet (25 mg total) by mouth daily.   ibuprofen 200 MG tablet Commonly known as:  ADVIL,MOTRIN Take 400 mg by mouth every 8 (eight) hours as needed (for pain.).   linaclotide 72 MCG capsule Commonly known as:  LINZESS Take 1 capsule (72 mcg total) by mouth daily before breakfast.   losartan 25 MG tablet Commonly known as:  COZAAR Take 1 tablet (25 mg total) by mouth daily.   multivitamin with minerals Tabs tablet Take 1 tablet by mouth daily.   omeprazole 40 MG capsule Commonly known as:  PRILOSEC Take 1 capsule (40 mg total) by mouth daily.   oxyCODONE 5 MG immediate release tablet Commonly known as:  Oxy IR/ROXICODONE Take 1-2 tablets (5-10 mg total) by mouth every 4 (four) hours as needed for moderate pain or severe pain.   tamsulosin 0.4 MG Caps capsule Commonly known as:  FLOMAX Take 0.4 mg by mouth daily as needed (for urinary retention).   Vitamin B-12 5000 MCG Subl Place 5,000 mcg under the tongue daily.   vitamin C 1000 MG tablet Take 1,000 mg by mouth daily.   Vitamin D3 2000 units Tabs Take 2,000 Units by mouth daily.            Discharge Care Instructions  (From admission, onward)        Start     Ordered   07/04/17 0000  Partial weight bearing    Comments:  50 % WEIGHT BEARING AS TAUGHT IN PHYSICAL THERAPY  Question Answer Comment  % Body Weight As discussed with Dr Durward Fortes   Laterality right   Extremity  Lower      07/04/17 0921   07/04/17 0000  Change dressing    Comments:  Change dressing onThursday, then change the dressing daily with sterile bandaids.  You may clean the incision with alcohol prior to redressing.   07/04/17 0921      FOLLOW UP VISIT:   Contact information for after-discharge care    Destination    [facility] .   Service:  Skilled Nursing Contact information: [address] [place] [phone]              DISPOSITION:   Home  CONDITION:  Stable   Mike Craze. [place], [place] [phone]  07/06/2017 4:01 PM

## 2017-07-06 NOTE — Plan of Care (Signed)
  Problem: Education: Goal: Knowledge of General Education information will improve 07/06/2017 1638 by Evalee Jefferson, RN Outcome: Adequate for Discharge 07/06/2017 1226 by Kandas Oliveto, Vertell Limber, RN Outcome: Progressing   Problem: Health Behavior/Discharge Planning: Goal: Ability to manage health-related needs will improve 07/06/2017 1638 by Evalee Jefferson, RN Outcome: Adequate for Discharge 07/06/2017 1226 by Evalee Jefferson, RN Outcome: Progressing   Problem: Activity: Goal: Risk for activity intolerance will decrease 07/06/2017 1638 by Evalee Jefferson, RN Outcome: Adequate for Discharge 07/06/2017 1226 by Evalee Jefferson, RN Outcome: Progressing   Problem: Nutrition: Goal: Adequate nutrition will be maintained 07/06/2017 1638 by Evalee Jefferson, RN Outcome: Adequate for Discharge 07/06/2017 1226 by Evalee Jefferson, RN Outcome: Progressing   Problem: Coping: Goal: Level of anxiety will decrease 07/06/2017 1638 by Evalee Jefferson, RN Outcome: Adequate for Discharge 07/06/2017 1226 by Evalee Jefferson, RN Outcome: Progressing   Problem: Elimination: Goal: Will not experience complications related to bowel motility 07/06/2017 1638 by Evalee Jefferson, RN Outcome: Adequate for Discharge 07/06/2017 1226 by Evalee Jefferson, RN Outcome: Progressing Goal: Will not experience complications related to urinary retention 07/06/2017 1638 by Evalee Jefferson, RN Outcome: Adequate for Discharge 07/06/2017 1226 by Evalee Jefferson, RN Outcome: Progressing   Problem: Pain Managment: Goal: General experience of comfort will improve 07/06/2017 1638 by Evalee Jefferson, RN Outcome: Adequate for Discharge 07/06/2017 1226 by Evalee Jefferson, RN Outcome: Progressing   Problem: Safety: Goal: Ability to remain free from injury will improve 07/06/2017 1638 by Evalee Jefferson, RN Outcome: Adequate for  Discharge 07/06/2017 1226 by Evalee Jefferson, RN Outcome: Progressing   Problem: Skin Integrity: Goal: Risk for impaired skin integrity will decrease 07/06/2017 1638 by Evalee Jefferson, RN Outcome: Adequate for Discharge 07/06/2017 1226 by Evalee Jefferson, RN Outcome: Progressing

## 2017-07-06 NOTE — Progress Notes (Signed)
Physical Therapy Treatment Patient Details Name: John Keller MRN: [id number] DOB: Aug 19, 1955 Today's Date: 07/06/2017    History of Present Illness Pt is a 62 y/o male s/p R knee arthroscopy and partial medial meniscectomy. PMH includes OSA on CPAP and HTN.     PT Comments    Pt performed gait and functional mobility with emphasis on stair training for safe entry into home.  Pt was not safe during previous session with use of crutches.  Pt will require a RW at d/c to ensure safety with AD.  Mod cues for weight bearing restriction and min guard for stair negotiation.  Pt has 10 stairs to enter home and no assistance.  He was able to demonstrate stair negotiation with VCs for correct sequencing, and able to switch RW height back and fourth with min guard assistance.  Pt remains to benefit from SNF placement to improve function before return home but he is progressing much better during this session this am.     Follow Up Recommendations  SNF;Supervision for mobility/OOB     Equipment Recommendations  Rolling walker with 5" wheels;3in1 (PT)    Recommendations for Other Services       Precautions / Restrictions Precautions Precautions: Fall Restrictions Weight Bearing Restrictions: Yes(WBAT) RLE Weight Bearing: Partial weight bearing RLE Partial Weight Bearing Percentage or Pounds: 50    Mobility  Bed Mobility Overal bed mobility: Modified Independent Bed Mobility: Supine to Sit     Supine to sit: Modified independent (Device/Increase time)     General bed mobility comments: No assistance needed.    Transfers Overall transfer level: Modified independent Equipment used: Rolling walker (2 wheeled) Transfers: Sit to/from Stand Sit to Stand: Modified independent (Device/Increase time)         General transfer comment: No assistance needed.  Ambulation/Gait Ambulation/Gait assistance: Supervision Ambulation Distance (Feet): 100 Feet Assistive device: Rolling walker (2  wheeled) Gait Pattern/deviations: Step-to pattern;Trunk flexed;Antalgic     General Gait Details: Cues for UE use in R stance phase to maintain partial weight bearing.     Stairs Stairs: Yes Stairs assistance: Min guard Stair Management: No rails;Forwards;With walker;Backwards Number of Stairs: 12 General stair comments: Cues for sequencing and RW placement.  Pt does not have support to tilt RW on stairs.  Instructed patient to lengthen front wheels of RW and shorten back legs on RW to allow RW to fit level on stairs.  Pt able to performed multiple reps of stair negotiation and readjusting RW for stair use and back to walking use.     Wheelchair Mobility    Modified Rankin (Stroke Patients Only)       Balance Overall balance assessment: Needs assistance   Sitting balance-[name] Scale: Fair       Standing balance-[name] Scale: Poor Standing balance comment: reliant on UE support.                             Cognition Arousal/Alertness: Awake/alert Behavior During Therapy: WFL for tasks assessed/performed Overall Cognitive Status: Within Functional Limits for tasks assessed                                        Exercises      General Comments        Pertinent Vitals/Pain Pain Assessment: 0-10 Pain Score: 3  Pain Location: R knee  Pain Descriptors / Indicators: Aching;Operative site guarding Pain Intervention(s): Monitored during session;Repositioned    Home Living                      Prior Function            PT Goals (current goals can now be found in the care plan section) Acute Rehab PT Goals Patient Stated Goal: to be able to walk better  Potential to Achieve Goals: Good Progress towards PT goals: Progressing toward goals    Frequency    Min 5X/week      PT Plan Current plan remains appropriate    Co-evaluation              AM-PAC PT "6 Clicks" Daily Activity  Outcome Measure  Difficulty  turning over in bed (including adjusting bedclothes, sheets and blankets)?: None Difficulty moving from lying on back to sitting on the side of the bed? : None Difficulty sitting down on and standing up from a chair with arms (e.g., wheelchair, bedside commode, etc,.)?: None Help needed moving to and from a bed to chair (including a wheelchair)?: None Help needed walking in hospital room?: A Little Help needed climbing 3-5 steps with a railing? : A Little 6 Click Score: 22    End of Session Equipment Utilized During Treatment: Gait belt Activity Tolerance: Patient limited by pain Patient left: in chair;with call bell/phone within reach Nurse Communication: Mobility status PT Visit Diagnosis: Other abnormalities of gait and mobility (R26.89);Pain;Difficulty in walking, not elsewhere classified (R26.2) Pain - Right/Left: Right Pain - part of body: Knee     Time: 5681-2751 PT Time Calculation (min) (ACUTE ONLY): 32 min  Charges:  $Gait Training: 8-22 mins $Therapeutic Activity: 8-22 mins                    G Codes:       Governor Rooks, PTA pager [place] 07/06/2017, 12:08 PM

## 2017-07-06 NOTE — Care Management Note (Signed)
Case Management Note  Patient Details  Name: John Keller MRN: [id number] Date of Birth: 1955/04/26  Subjective/Objective:                    Action/Plan: Patient will not go to SNF, will go home with Home Health. Case Manager spoke with patient concerning discharge plan. Choice for Home Health was offered, referral was called to [place], [place] Liaison. Dr. Durward Fortes sent prescription for patient's pain medication to [facility] outpatient pharmacy, CNA will pickup for patient. Social worker has provided taxi Clinical cytogeneticist for patient's transportation home. He says he will be caring for himself at discharge.     Expected Discharge Date:  07/06/17               Expected Discharge Plan:  [place]  In-House Referral:  NA  Discharge planning Services  CM Consult  Post Acute Care Choice:  Durable Medical Equipment, Home Health Choice offered to:  Patient  DME Arranged:  Walker rolling DME Agency:  [place]:  PT [facility] Agency:  [place]  Status of Service:  Completed, signed off  If discussed at [place] of Stay Meetings, dates discussed:    Additional Comments:  Ninfa Meeker, RN 07/06/2017, 4:08 PM

## 2017-07-06 NOTE — Social Work (Signed)
CSW confirmed SNF offer from [facility] this morning and they will being insurance auth.  CSW will continue to follow for SNF placement.  Elissa Hefty, LCSW Clinical Social Worker [phone]

## 2017-07-06 NOTE — Plan of Care (Signed)
  Problem: Education: Goal: Knowledge of General Education information will improve Outcome: Progressing   Problem: Health Behavior/Discharge Planning: Goal: Ability to manage health-related needs will improve Outcome: Progressing   Problem: Activity: Goal: Risk for activity intolerance will decrease Outcome: Progressing   Problem: Nutrition: Goal: Adequate nutrition will be maintained Outcome: Progressing   Problem: Coping: Goal: Level of anxiety will decrease Outcome: Progressing   Problem: Elimination: Goal: Will not experience complications related to bowel motility Outcome: Progressing Goal: Will not experience complications related to urinary retention Outcome: Progressing   Problem: Pain Managment: Goal: General experience of comfort will improve Outcome: Progressing   Problem: Safety: Goal: Ability to remain free from injury will improve Outcome: Progressing   Problem: Skin Integrity: Goal: Risk for impaired skin integrity will decrease Outcome: Progressing

## 2017-07-06 NOTE — Progress Notes (Signed)
Subjective: 2 Days Post-Op Procedure(s) (LRB): RIGHT KNEE ARTHROSCOPY WITH DEBRIDEMENT, MEDIAL MENISCECTOMY (Right) Patient reports pain as mild.    Objective: Vital signs in last 24 hours: Temp:  [98.2 F (36.8 C)-99.1 F (37.3 C)] 98.2 F (36.8 C) (05/09 1428) Pulse Rate:  [79-83] 79 (05/09 1428) Resp:  [16-17] 17 (05/09 1428) BP: (127-138)/(73-83) 127/73 (05/09 1428) SpO2:  [98 %-99 %] 99 % (05/09 1428)  Intake/Output from previous day: 05/08 0701 - 05/09 0700 In: 720 [P.O.:720] Out: -  Intake/Output this shift: Total I/O In: 240 [P.O.:240] Out: -   No results for input(s): HGB in the last 72 hours. No results for input(s): WBC, RBC, HCT, PLT in the last 72 hours. No results for input(s): NA, K, CL, CO2, BUN, CREATININE, GLUCOSE, CALCIUM in the last 72 hours. No results for input(s): LABPT, INR in the last 72 hours.  Neurologically intact No cellulitis present  Aspirated 70 cc's blood from right knee-feeling much better  Assessment/Plan: 2 Days Post-Op Procedure(s) (LRB): RIGHT KNEE ARTHROSCOPY WITH DEBRIDEMENT, MEDIAL MENISCECTOMY (Right) Discharge to home    Garald Balding 07/06/2017, 3:52 PM

## 2017-07-08 DIAGNOSIS — Z8701 Personal history of pneumonia (recurrent): Secondary | ICD-10-CM | POA: Diagnosis not present

## 2017-07-08 DIAGNOSIS — Z4789 Encounter for other orthopedic aftercare: Secondary | ICD-10-CM | POA: Diagnosis not present

## 2017-07-08 DIAGNOSIS — I10 Essential (primary) hypertension: Secondary | ICD-10-CM | POA: Diagnosis not present

## 2017-07-08 DIAGNOSIS — K589 Irritable bowel syndrome without diarrhea: Secondary | ICD-10-CM | POA: Diagnosis not present

## 2017-07-08 DIAGNOSIS — Z87440 Personal history of urinary (tract) infections: Secondary | ICD-10-CM | POA: Diagnosis not present

## 2017-07-08 DIAGNOSIS — Z9181 History of falling: Secondary | ICD-10-CM | POA: Diagnosis not present

## 2017-07-08 DIAGNOSIS — D649 Anemia, unspecified: Secondary | ICD-10-CM | POA: Diagnosis not present

## 2017-07-08 DIAGNOSIS — E559 Vitamin D deficiency, unspecified: Secondary | ICD-10-CM | POA: Diagnosis not present

## 2017-07-08 DIAGNOSIS — G4733 Obstructive sleep apnea (adult) (pediatric): Secondary | ICD-10-CM | POA: Diagnosis not present

## 2017-07-08 DIAGNOSIS — F329 Major depressive disorder, single episode, unspecified: Secondary | ICD-10-CM | POA: Diagnosis not present

## 2017-07-08 DIAGNOSIS — M1711 Unilateral primary osteoarthritis, right knee: Secondary | ICD-10-CM | POA: Diagnosis not present

## 2017-07-08 DIAGNOSIS — F419 Anxiety disorder, unspecified: Secondary | ICD-10-CM | POA: Diagnosis not present

## 2017-07-08 DIAGNOSIS — K219 Gastro-esophageal reflux disease without esophagitis: Secondary | ICD-10-CM | POA: Diagnosis not present

## 2017-07-10 ENCOUNTER — Ambulatory Visit (INDEPENDENT_AMBULATORY_CARE_PROVIDER_SITE_OTHER): Payer: BLUE CROSS/BLUE SHIELD | Admitting: Orthopaedic Surgery

## 2017-07-10 ENCOUNTER — Encounter (INDEPENDENT_AMBULATORY_CARE_PROVIDER_SITE_OTHER): Payer: Self-pay | Admitting: Orthopaedic Surgery

## 2017-07-10 VITALS — BP 148/85 | HR 97 | Ht 70.0 in | Wt 239.0 lb

## 2017-07-10 DIAGNOSIS — M25561 Pain in right knee: Secondary | ICD-10-CM

## 2017-07-10 DIAGNOSIS — G8929 Other chronic pain: Secondary | ICD-10-CM
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
## 2017-07-11 DIAGNOSIS — F840 Autistic disorder: Secondary | ICD-10-CM | POA: Diagnosis not present

## 2017-07-12 DIAGNOSIS — F329 Major depressive disorder, single episode, unspecified: Secondary | ICD-10-CM | POA: Diagnosis not present

## 2017-07-12 DIAGNOSIS — Z9181 History of falling: Secondary | ICD-10-CM | POA: Diagnosis not present

## 2017-07-12 DIAGNOSIS — G4733 Obstructive sleep apnea (adult) (pediatric): Secondary | ICD-10-CM | POA: Diagnosis not present

## 2017-07-12 DIAGNOSIS — Z4789 Encounter for other orthopedic aftercare: Secondary | ICD-10-CM | POA: Diagnosis not present

## 2017-07-12 DIAGNOSIS — Z87440 Personal history of urinary (tract) infections: Secondary | ICD-10-CM | POA: Diagnosis not present

## 2017-07-12 DIAGNOSIS — F419 Anxiety disorder, unspecified: Secondary | ICD-10-CM | POA: Diagnosis not present

## 2017-07-12 DIAGNOSIS — E559 Vitamin D deficiency, unspecified: Secondary | ICD-10-CM | POA: Diagnosis not present

## 2017-07-12 DIAGNOSIS — I10 Essential (primary) hypertension: Secondary | ICD-10-CM | POA: Diagnosis not present

## 2017-07-12 DIAGNOSIS — K219 Gastro-esophageal reflux disease without esophagitis: Secondary | ICD-10-CM | POA: Diagnosis not present

## 2017-07-12 DIAGNOSIS — M1711 Unilateral primary osteoarthritis, right knee: Secondary | ICD-10-CM | POA: Diagnosis not present

## 2017-07-12 DIAGNOSIS — Z8701 Personal history of pneumonia (recurrent): Secondary | ICD-10-CM | POA: Diagnosis not present

## 2017-07-12 DIAGNOSIS — D649 Anemia, unspecified: Secondary | ICD-10-CM | POA: Diagnosis not present

## 2017-07-12 DIAGNOSIS — K589 Irritable bowel syndrome without diarrhea: Secondary | ICD-10-CM | POA: Diagnosis not present

## 2017-07-13 DIAGNOSIS — Z87440 Personal history of urinary (tract) infections: Secondary | ICD-10-CM | POA: Diagnosis not present

## 2017-07-13 DIAGNOSIS — F329 Major depressive disorder, single episode, unspecified: Secondary | ICD-10-CM | POA: Diagnosis not present

## 2017-07-13 DIAGNOSIS — K589 Irritable bowel syndrome without diarrhea: Secondary | ICD-10-CM | POA: Diagnosis not present

## 2017-07-13 DIAGNOSIS — Z9181 History of falling: Secondary | ICD-10-CM | POA: Diagnosis not present

## 2017-07-13 DIAGNOSIS — Z4789 Encounter for other orthopedic aftercare: Secondary | ICD-10-CM | POA: Diagnosis not present

## 2017-07-13 DIAGNOSIS — F419 Anxiety disorder, unspecified: Secondary | ICD-10-CM | POA: Diagnosis not present

## 2017-07-13 DIAGNOSIS — D649 Anemia, unspecified: Secondary | ICD-10-CM | POA: Diagnosis not present

## 2017-07-13 DIAGNOSIS — M1711 Unilateral primary osteoarthritis, right knee: Secondary | ICD-10-CM | POA: Diagnosis not present

## 2017-07-13 DIAGNOSIS — G4733 Obstructive sleep apnea (adult) (pediatric): Secondary | ICD-10-CM | POA: Diagnosis not present

## 2017-07-13 DIAGNOSIS — Z8701 Personal history of pneumonia (recurrent): Secondary | ICD-10-CM | POA: Diagnosis not present

## 2017-07-13 DIAGNOSIS — I10 Essential (primary) hypertension: Secondary | ICD-10-CM | POA: Diagnosis not present

## 2017-07-13 DIAGNOSIS — E559 Vitamin D deficiency, unspecified: Secondary | ICD-10-CM | POA: Diagnosis not present

## 2017-07-13 DIAGNOSIS — K219 Gastro-esophageal reflux disease without esophagitis: Secondary | ICD-10-CM | POA: Diagnosis not present

## 2017-07-17 ENCOUNTER — Ambulatory Visit (INDEPENDENT_AMBULATORY_CARE_PROVIDER_SITE_OTHER): Payer: BLUE CROSS/BLUE SHIELD | Admitting: Orthopaedic Surgery

## 2017-07-17 ENCOUNTER — Telehealth (INDEPENDENT_AMBULATORY_CARE_PROVIDER_SITE_OTHER): Payer: Self-pay | Admitting: Orthopaedic Surgery

## 2017-07-17 ENCOUNTER — Encounter (INDEPENDENT_AMBULATORY_CARE_PROVIDER_SITE_OTHER): Payer: Self-pay | Admitting: Orthopaedic Surgery

## 2017-07-17 VITALS — BP 162/84 | HR 83 | Resp 18 | Ht 70.0 in | Wt 239.0 lb

## 2017-07-17 DIAGNOSIS — D649 Anemia, unspecified: Secondary | ICD-10-CM | POA: Diagnosis not present

## 2017-07-17 DIAGNOSIS — K219 Gastro-esophageal reflux disease without esophagitis: Secondary | ICD-10-CM | POA: Diagnosis not present

## 2017-07-17 DIAGNOSIS — Z87440 Personal history of urinary (tract) infections: Secondary | ICD-10-CM | POA: Diagnosis not present

## 2017-07-17 DIAGNOSIS — M1711 Unilateral primary osteoarthritis, right knee: Secondary | ICD-10-CM | POA: Diagnosis not present

## 2017-07-17 DIAGNOSIS — K589 Irritable bowel syndrome without diarrhea: Secondary | ICD-10-CM | POA: Diagnosis not present

## 2017-07-17 DIAGNOSIS — Z9181 History of falling: Secondary | ICD-10-CM | POA: Diagnosis not present

## 2017-07-17 DIAGNOSIS — I10 Essential (primary) hypertension: Secondary | ICD-10-CM | POA: Diagnosis not present

## 2017-07-17 DIAGNOSIS — F419 Anxiety disorder, unspecified: Secondary | ICD-10-CM | POA: Diagnosis not present

## 2017-07-17 DIAGNOSIS — G8929 Other chronic pain: Secondary | ICD-10-CM

## 2017-07-17 DIAGNOSIS — Z8701 Personal history of pneumonia (recurrent): Secondary | ICD-10-CM | POA: Diagnosis not present

## 2017-07-17 DIAGNOSIS — G4733 Obstructive sleep apnea (adult) (pediatric): Secondary | ICD-10-CM | POA: Diagnosis not present

## 2017-07-17 DIAGNOSIS — Z4789 Encounter for other orthopedic aftercare: Secondary | ICD-10-CM | POA: Diagnosis not present

## 2017-07-17 DIAGNOSIS — F329 Major depressive disorder, single episode, unspecified: Secondary | ICD-10-CM | POA: Diagnosis not present

## 2017-07-17 DIAGNOSIS — M25561 Pain in right knee: Secondary | ICD-10-CM

## 2017-07-17 DIAGNOSIS — E559 Vitamin D deficiency, unspecified: Secondary | ICD-10-CM | POA: Diagnosis not present
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
## 2017-07-17 NOTE — Telephone Encounter (Signed)
John Keller, physical therapist with [facility] called asking if patient should continue with home physical therapy or discharge to a home program or outpatient PT.  Patient has been seen since 07/08/17.   Please call back to let us know how to proceed.  [phone]

## 2017-07-17 NOTE — Telephone Encounter (Signed)
Continue PT this week per Dr. Durward Fortes

## 2017-07-19 DIAGNOSIS — F419 Anxiety disorder, unspecified: Secondary | ICD-10-CM | POA: Diagnosis not present

## 2017-07-19 DIAGNOSIS — I10 Essential (primary) hypertension: Secondary | ICD-10-CM | POA: Diagnosis not present

## 2017-07-19 DIAGNOSIS — Z9181 History of falling: Secondary | ICD-10-CM | POA: Diagnosis not present

## 2017-07-19 DIAGNOSIS — K219 Gastro-esophageal reflux disease without esophagitis: Secondary | ICD-10-CM | POA: Diagnosis not present

## 2017-07-19 DIAGNOSIS — Z4789 Encounter for other orthopedic aftercare: Secondary | ICD-10-CM | POA: Diagnosis not present

## 2017-07-19 DIAGNOSIS — F329 Major depressive disorder, single episode, unspecified: Secondary | ICD-10-CM | POA: Diagnosis not present

## 2017-07-19 DIAGNOSIS — E559 Vitamin D deficiency, unspecified: Secondary | ICD-10-CM | POA: Diagnosis not present

## 2017-07-19 DIAGNOSIS — Z8701 Personal history of pneumonia (recurrent): Secondary | ICD-10-CM | POA: Diagnosis not present

## 2017-07-19 DIAGNOSIS — K589 Irritable bowel syndrome without diarrhea: Secondary | ICD-10-CM | POA: Diagnosis not present

## 2017-07-19 DIAGNOSIS — G4733 Obstructive sleep apnea (adult) (pediatric): Secondary | ICD-10-CM | POA: Diagnosis not present

## 2017-07-19 DIAGNOSIS — M1711 Unilateral primary osteoarthritis, right knee: Secondary | ICD-10-CM | POA: Diagnosis not present

## 2017-07-19 DIAGNOSIS — Z87440 Personal history of urinary (tract) infections: Secondary | ICD-10-CM | POA: Diagnosis not present

## 2017-07-19 DIAGNOSIS — D649 Anemia, unspecified: Secondary | ICD-10-CM | POA: Diagnosis not present

## 2017-07-24 ENCOUNTER — Other Ambulatory Visit: Payer: Self-pay | Admitting: Family Medicine

## 2017-07-24 DIAGNOSIS — I10 Essential (primary) hypertension: Secondary | ICD-10-CM

## 2017-07-29 ENCOUNTER — Other Ambulatory Visit: Payer: Self-pay | Admitting: Medical

## 2017-07-31 DIAGNOSIS — F840 Autistic disorder: Secondary | ICD-10-CM | POA: Diagnosis not present

## 2017-08-03 ENCOUNTER — Encounter: Payer: Self-pay | Admitting: Medical

## 2017-08-03 ENCOUNTER — Ambulatory Visit: Payer: BLUE CROSS/BLUE SHIELD | Admitting: Medical

## 2017-08-03 VITALS — BP 128/74 | HR 88 | Ht 70.0 in | Wt 248.2 lb

## 2017-08-03 DIAGNOSIS — Z23 Encounter for immunization: Secondary | ICD-10-CM | POA: Diagnosis not present

## 2017-08-03 DIAGNOSIS — I10 Essential (primary) hypertension: Secondary | ICD-10-CM

## 2017-08-03 DIAGNOSIS — G4730 Sleep apnea, unspecified: Secondary | ICD-10-CM

## 2017-08-03 MED ORDER — LOSARTAN POTASSIUM-HCTZ 50-12.5 MG PO TABS: 1.0000 | ORAL_TABLET | Freq: Every day | ORAL | 3 refills | Status: DC

## 2017-08-03 NOTE — Addendum Note (Signed)
Addended by: Carolee Rota F on: 08/03/2017 09:51 AM   Modules accepted: Orders

## 2017-08-03 NOTE — Progress Notes (Signed)
Subjective: Chief Complaint  Patient presents with  . Hypertension    follow up on bp. Still trying to get over his surgery he had done last month.    Here for BP f/u.   Last visit 06/2017 we added Losartan 25mg .  He was already on HCTZ 25mg  daily.  Was encouraged to use his CPAP.    Just recently had knee surgery.  [facility] was coming out for a few weeks for PT.     No other aggravating or relieving factors. No other complaint.   Past Medical History:  Diagnosis Date  . Anemia 2009   noted during hospitalization for surgical I&D of chest, arm abscess  . Anxiety   . Appendicitis   . Arthritis   . Depression   . Dyspnea    with anxiety and some exertion  . Gastric tumor 2013   resection  . GERD (gastroesophageal reflux disease)   . GI bleed 2013  . H/O seasonal allergies   . History of hiatal hernia   . Hypertension   . IBS (irritable bowel syndrome)   . Pneumonia   . Sleep apnea    has a cpap machine but seldom uses it.    Current Outpatient Medications on File Prior to Visit  Medication Sig Dispense Refill  . hydrochlorothiazide (HYDRODIURIL) 25 MG tablet TAKE 1 TABLET(25 MG) BY MOUTH DAILY 30 tablet 0  . losartan (COZAAR) 25 MG tablet TAKE 1 TABLET(25 MG) BY MOUTH DAILY 30 tablet 0  . acetaminophen (TYLENOL) 500 MG tablet Take 1,000 mg by mouth every 6 (six) hours as needed (pain). Reported on 08/05/2015    . Ascorbic Acid (VITAMIN C) 1000 MG tablet Take 1,000 mg by mouth daily.    . Cholecalciferol (VITAMIN D3) 2000 units TABS Take 2,000 Units by mouth daily.    . Cyanocobalamin (VITAMIN B-12) 5000 MCG SUBL Place 5,000 mcg under the tongue daily.    Marland Kitchen ibuprofen (ADVIL,MOTRIN) 200 MG tablet Take 400 mg by mouth every 8 (eight) hours as needed (for pain.).    Marland Kitchen linaclotide (LINZESS) 72 MCG capsule Take 1 capsule (72 mcg total) by mouth daily before breakfast. (Patient not taking: Reported on 08/03/2017) 16 capsule 0  . Multiple Vitamin (MULITIVITAMIN WITH MINERALS)  TABS Take 1 tablet by mouth daily.    Marland Kitchen omeprazole (PRILOSEC) 40 MG capsule Take 1 capsule (40 mg total) by mouth daily. (Patient not taking: Reported on 08/03/2017) 30 capsule 3  . oxyCODONE (OXY IR/ROXICODONE) 5 MG immediate release tablet Take 1-2 tablets (5-10 mg total) by mouth every 4 (four) hours as needed for moderate pain or severe pain. (Patient not taking: Reported on 08/03/2017) 40 tablet 0  . Polyethylene Glycol 3350 (MIRALAX PO) Take by mouth.    . tamsulosin (FLOMAX) 0.4 MG CAPS capsule Take 0.4 mg by mouth daily as needed (for urinary retention).   1   No current facility-administered medications on file prior to visit.    ROS as in subjective   Objective: BP 128/74   Pulse 88   Ht 5\' 10"  (1.778 m)   Wt 248 lb 3.2 oz (112.6 kg)   SpO2 (!) 18%   BMI 35.61 kg/m   BP Readings from Last 3 Encounters:  08/03/17 128/74  07/17/17 (!) 162/84  07/10/17 (!) 148/85   General appearance: alert, no distress, WD/WN,  Neck: supple, no lymphadenopathy, no thyromegaly, no masses Heart: RRR, normal S1, S2, no murmurs Lungs: CTA bilaterally, no wheezes, rhonchi, or rales  Pulses: 2+ symmetric, upper and lower extremities, normal cap refill   Assessment: Encounter Diagnoses  Name Primary?  . Essential hypertension Yes  . Mild sleep apnea   . Need for Tdap vaccination      Plan:  BP much improved  Change to Losartan HCT 50/12.5mg  daily for convenience  Patient Instructions  Recommendations  Shingles vaccine:  I recommend you have a shingles vaccine to help prevent shingles or herpes zoster outbreak.   Please call your insurer to inquire about coverage for the Shingrix vaccine given in 2 doses.   Some insurers cover this vaccine after age 24, some cover this after age 42.  If your insurer covers this, then call to schedule appointment to have this vaccine here.  When you run out of your current blood pressure pills the new prescription will be 1 tablet called losartan/HCT  combination pill.  This will give you 1 pill instead of 2 pills for blood pressure  We will check your cholesterol today  We updated your tetanus diphtheria pertussis vaccine today.  This is good for 10 years.  Continue doing your home knee rehabilitation exercises.  Recommendations for improving lipids:  Foods TO AVOID or limit - fried foods, high sugar foods, white bread, enriched flour, fast food, red meat, large amounts of cheese, processed foods such as little debbie cakes, cookies, pies, donuts, for example  Foods TO INCLUDE in the diet - whole grains such as whole grain pasta, whole grain bread, barley, steel cut oatmeal (not instant oatmeal), avocado, fish, green leafy vegetables, nuts, increased fiber in diet, and using olive oil in small amounts for cooking or as salad dressing vinaigrette.       Obe was seen today for hypertension.  Diagnoses and all orders for this visit:  Essential hypertension -     Lipid panel  Mild sleep apnea  Need for Tdap vaccination  Other orders -     losartan-hydrochlorothiazide (HYZAAR) 50-12.5 MG tablet; Take 1 tablet by mouth daily.

## 2017-08-03 NOTE — Patient Instructions (Signed)
Recommendations  Shingles vaccine:  I recommend you have a shingles vaccine to help prevent shingles or herpes zoster outbreak.   Please call your insurer to inquire about coverage for the Shingrix vaccine given in 2 doses.   Some insurers cover this vaccine after age 61, some cover this after age 31.  If your insurer covers this, then call to schedule appointment to have this vaccine here.  When you run out of your current blood pressure pills the new prescription will be 1 tablet called losartan/HCT combination pill.  This will give you 1 pill instead of 2 pills for blood pressure  We will check your cholesterol today  We updated your tetanus diphtheria pertussis vaccine today.  This is good for 10 years.  Continue doing your home knee rehabilitation exercises.  Recommendations for improving lipids:  Foods TO AVOID or limit - fried foods, high sugar foods, white bread, enriched flour, fast food, red meat, large amounts of cheese, processed foods such as little debbie cakes, cookies, pies, donuts, for example  Foods TO INCLUDE in the diet - whole grains such as whole grain pasta, whole grain bread, barley, steel cut oatmeal (not instant oatmeal), avocado, fish, green leafy vegetables, nuts, increased fiber in diet, and using olive oil in small amounts for cooking or as salad dressing vinaigrette.

## 2017-08-04 LAB — LIPID PANEL
CHOLESTEROL TOTAL: 167 mg/dL (ref 100–199)
Chol/HDL Ratio: 3.6 ratio (ref 0.0–5.0)
HDL: 46 mg/dL (ref >39)
LDL Calculated: 102 mg/dL — ABNORMAL HIGH (ref 0–99)
Triglycerides: 97 mg/dL (ref 0–149)
VLDL Cholesterol Cal: 19 mg/dL (ref 5–40)

## 2017-08-10 ENCOUNTER — Telehealth: Payer: Self-pay | Admitting: Medical

## 2017-08-10 ENCOUNTER — Other Ambulatory Visit: Payer: Self-pay | Admitting: Medical

## 2017-08-10 MED ORDER — ROSUVASTATIN CALCIUM 10 MG PO TABS: 10.0000 mg | ORAL_TABLET | Freq: Every day | ORAL | 0 refills | Status: DC

## 2017-08-10 NOTE — Telephone Encounter (Signed)
Begin crestor QHS daily, f/u 15mo fasting   Recommendations for improving lipids:  Foods TO AVOID or limit - fried foods, high sugar foods, white bread, enriched flour, fast food, red meat, large amounts of cheese, processed foods such as little debbie cakes, cookies, pies, donuts, for example  Foods TO INCLUDE in the diet - whole grains such as whole grain pasta, whole grain bread, barley, steel cut oatmeal (not instant oatmeal), avocado, fish, green leafy vegetables, nuts, increased fiber in diet, and using olive oil in small amounts for cooking or as salad dressing vinaigrette.

## 2017-08-10 NOTE — Telephone Encounter (Signed)
Pt informed of providers note.

## 2017-08-10 NOTE — Telephone Encounter (Signed)
Pt called back and stated he did look online for info on eating foods to help lower cholesterol but would like to go ahead and start the medication and if you have any diet info you could mail him would be great

## 2017-08-10 NOTE — Telephone Encounter (Signed)
Pt left message that he was waiting on the cholesterol medicine to be sent to pharmacy.  Please send.

## 2017-08-14 ENCOUNTER — Encounter (INDEPENDENT_AMBULATORY_CARE_PROVIDER_SITE_OTHER): Payer: Self-pay | Admitting: Orthopaedic Surgery

## 2017-08-14 ENCOUNTER — Other Ambulatory Visit (INDEPENDENT_AMBULATORY_CARE_PROVIDER_SITE_OTHER): Payer: Self-pay | Admitting: *Deleted

## 2017-08-14 ENCOUNTER — Ambulatory Visit (INDEPENDENT_AMBULATORY_CARE_PROVIDER_SITE_OTHER): Payer: BLUE CROSS/BLUE SHIELD | Admitting: Orthopaedic Surgery

## 2017-08-14 VITALS — BP 139/88 | HR 76 | Resp 16 | Ht 70.0 in | Wt 249.0 lb

## 2017-08-14 DIAGNOSIS — G8929 Other chronic pain: Secondary | ICD-10-CM

## 2017-08-14 DIAGNOSIS — M25561 Pain in right knee: Principal | ICD-10-CM

## 2017-08-14 DIAGNOSIS — M23203 Derangement of unspecified medial meniscus due to old tear or injury, right knee: Secondary | ICD-10-CM
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
## 2017-08-16 ENCOUNTER — Other Ambulatory Visit: Payer: Self-pay

## 2017-08-16 ENCOUNTER — Ambulatory Visit: Payer: BLUE CROSS/BLUE SHIELD | Attending: Orthopaedic Surgery | Admitting: Physical Therapy

## 2017-08-16 DIAGNOSIS — R262 Difficulty in walking, not elsewhere classified: Secondary | ICD-10-CM | POA: Insufficient documentation

## 2017-08-16 DIAGNOSIS — M25561 Pain in right knee: Secondary | ICD-10-CM | POA: Diagnosis not present

## 2017-08-16 DIAGNOSIS — M6281 Muscle weakness (generalized): Secondary | ICD-10-CM | POA: Diagnosis not present
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
## 2017-08-17 ENCOUNTER — Ambulatory Visit: Payer: BLUE CROSS/BLUE SHIELD | Admitting: Physical Therapy

## 2017-08-17 ENCOUNTER — Encounter: Payer: Self-pay | Admitting: Physical Therapy

## 2017-08-17 DIAGNOSIS — M25561 Pain in right knee: Secondary | ICD-10-CM

## 2017-08-17 DIAGNOSIS — R262 Difficulty in walking, not elsewhere classified: Secondary | ICD-10-CM

## 2017-08-17 DIAGNOSIS — M6281 Muscle weakness (generalized): Secondary | ICD-10-CM | POA: Diagnosis not present
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
## 2017-08-21 DIAGNOSIS — F840 Autistic disorder: Secondary | ICD-10-CM | POA: Diagnosis not present

## 2017-08-29 ENCOUNTER — Encounter: Payer: Self-pay | Admitting: Physical Therapy

## 2017-08-29 ENCOUNTER — Ambulatory Visit: Payer: BLUE CROSS/BLUE SHIELD | Attending: Orthopaedic Surgery | Admitting: Physical Therapy

## 2017-08-29 DIAGNOSIS — M25561 Pain in right knee: Secondary | ICD-10-CM

## 2017-08-29 DIAGNOSIS — R262 Difficulty in walking, not elsewhere classified: Secondary | ICD-10-CM

## 2017-08-29 DIAGNOSIS — M6281 Muscle weakness (generalized): Secondary | ICD-10-CM | POA: Insufficient documentation
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
## 2017-09-04 ENCOUNTER — Encounter: Payer: Self-pay | Admitting: Physical Therapy

## 2017-09-04 ENCOUNTER — Ambulatory Visit: Payer: BLUE CROSS/BLUE SHIELD | Admitting: Physical Therapy

## 2017-09-04 DIAGNOSIS — M25561 Pain in right knee: Secondary | ICD-10-CM

## 2017-09-04 DIAGNOSIS — R262 Difficulty in walking, not elsewhere classified: Secondary | ICD-10-CM | POA: Diagnosis not present

## 2017-09-04 DIAGNOSIS — M6281 Muscle weakness (generalized): Secondary | ICD-10-CM | POA: Diagnosis not present
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
## 2017-09-05 DIAGNOSIS — F840 Autistic disorder: Secondary | ICD-10-CM | POA: Diagnosis not present

## 2017-09-06 ENCOUNTER — Ambulatory Visit: Payer: BLUE CROSS/BLUE SHIELD | Admitting: Physical Therapy

## 2017-09-06 ENCOUNTER — Telehealth (INDEPENDENT_AMBULATORY_CARE_PROVIDER_SITE_OTHER): Payer: Self-pay | Admitting: Orthopaedic Surgery

## 2017-09-06 ENCOUNTER — Encounter: Payer: Self-pay | Admitting: Physical Therapy

## 2017-09-06 DIAGNOSIS — M25561 Pain in right knee: Secondary | ICD-10-CM

## 2017-09-06 DIAGNOSIS — R262 Difficulty in walking, not elsewhere classified: Secondary | ICD-10-CM

## 2017-09-06 DIAGNOSIS — M6281 Muscle weakness (generalized): Secondary | ICD-10-CM | POA: Diagnosis not present
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
## 2017-09-06 NOTE — Telephone Encounter (Signed)
07/04/2017 OP Note emailed to [facility] w/ Jolyn Lent

## 2017-09-11 ENCOUNTER — Ambulatory Visit: Payer: BLUE CROSS/BLUE SHIELD | Admitting: Physical Therapy

## 2017-09-11 DIAGNOSIS — R262 Difficulty in walking, not elsewhere classified: Secondary | ICD-10-CM | POA: Diagnosis not present

## 2017-09-11 DIAGNOSIS — M6281 Muscle weakness (generalized): Secondary | ICD-10-CM

## 2017-09-11 DIAGNOSIS — M25561 Pain in right knee: Secondary | ICD-10-CM
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
## 2017-09-11 NOTE — Patient Instructions (Signed)
Leg Extension (Hamstring)    Sit toward front edge of chair, with leg out straight, heel on floor, toes pointing toward body. Keeping back straight, bend forward at hip,  Hold 30 seconds. Return, . Repeat 3___ times. Repeat with other leg. Do 1___ sessions per day. Variation: Perform from standing position, with support of wall in pool . Life long Copyright  [employer]. All rights reserved.

## 2017-09-13 ENCOUNTER — Encounter: Payer: Self-pay | Admitting: Physical Therapy

## 2017-09-13 ENCOUNTER — Ambulatory Visit: Payer: BLUE CROSS/BLUE SHIELD | Admitting: Physical Therapy

## 2017-09-13 DIAGNOSIS — R262 Difficulty in walking, not elsewhere classified: Secondary | ICD-10-CM

## 2017-09-13 DIAGNOSIS — M6281 Muscle weakness (generalized): Secondary | ICD-10-CM | POA: Diagnosis not present

## 2017-09-13 DIAGNOSIS — M25561 Pain in right knee: Secondary | ICD-10-CM
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
## 2017-09-15 ENCOUNTER — Ambulatory Visit (INDEPENDENT_AMBULATORY_CARE_PROVIDER_SITE_OTHER): Payer: BLUE CROSS/BLUE SHIELD | Admitting: Orthopaedic Surgery

## 2017-09-15 ENCOUNTER — Encounter (INDEPENDENT_AMBULATORY_CARE_PROVIDER_SITE_OTHER): Payer: Self-pay | Admitting: Orthopaedic Surgery

## 2017-09-15 DIAGNOSIS — G8929 Other chronic pain: Secondary | ICD-10-CM

## 2017-09-15 DIAGNOSIS — M25561 Pain in right knee: Secondary | ICD-10-CM
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
## 2017-09-18 ENCOUNTER — Encounter: Payer: Self-pay | Admitting: Physical Therapy

## 2017-09-18 ENCOUNTER — Ambulatory Visit: Payer: BLUE CROSS/BLUE SHIELD | Admitting: Physical Therapy

## 2017-09-18 DIAGNOSIS — R262 Difficulty in walking, not elsewhere classified: Secondary | ICD-10-CM | POA: Diagnosis not present

## 2017-09-18 DIAGNOSIS — M25561 Pain in right knee: Secondary | ICD-10-CM | POA: Diagnosis not present

## 2017-09-18 DIAGNOSIS — M6281 Muscle weakness (generalized): Secondary | ICD-10-CM | POA: Diagnosis not present

## 2017-09-18 NOTE — Patient Instructions (Signed)
Keep doing the HEP
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
## 2017-09-19 DIAGNOSIS — F840 Autistic disorder: Secondary | ICD-10-CM | POA: Diagnosis not present

## 2017-09-20 ENCOUNTER — Ambulatory Visit: Payer: BLUE CROSS/BLUE SHIELD | Admitting: Physical Therapy

## 2017-09-20 ENCOUNTER — Encounter: Payer: Self-pay | Admitting: Physical Therapy

## 2017-09-20 DIAGNOSIS — M6281 Muscle weakness (generalized): Secondary | ICD-10-CM

## 2017-09-20 DIAGNOSIS — M25561 Pain in right knee: Secondary | ICD-10-CM | POA: Diagnosis not present

## 2017-09-20 DIAGNOSIS — R262 Difficulty in walking, not elsewhere classified: Secondary | ICD-10-CM
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
## 2017-09-25 ENCOUNTER — Ambulatory Visit: Payer: BLUE CROSS/BLUE SHIELD | Admitting: Physical Therapy

## 2017-09-25 ENCOUNTER — Encounter: Payer: Self-pay | Admitting: Physical Therapy

## 2017-09-25 DIAGNOSIS — R262 Difficulty in walking, not elsewhere classified: Secondary | ICD-10-CM | POA: Diagnosis not present

## 2017-09-25 DIAGNOSIS — M6281 Muscle weakness (generalized): Secondary | ICD-10-CM | POA: Diagnosis not present

## 2017-09-25 DIAGNOSIS — M25561 Pain in right knee: Secondary | ICD-10-CM
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
## 2017-09-27 ENCOUNTER — Ambulatory Visit: Payer: BLUE CROSS/BLUE SHIELD | Admitting: Physical Therapy

## 2017-09-27 ENCOUNTER — Encounter: Payer: Self-pay | Admitting: Physical Therapy

## 2017-09-27 DIAGNOSIS — M6281 Muscle weakness (generalized): Secondary | ICD-10-CM

## 2017-09-27 DIAGNOSIS — R262 Difficulty in walking, not elsewhere classified: Secondary | ICD-10-CM | POA: Diagnosis not present

## 2017-09-27 DIAGNOSIS — M25561 Pain in right knee: Secondary | ICD-10-CM

## 2017-09-27 NOTE — Patient Instructions (Addendum)
Hamstring Curl: Resisted (Sitting)    Facing anchor with tubing on right ankle, leg straight out, bend knee. Repeat ___10_ times per set. Do _1-2___ sets per session. Do _1 Sit-to-Stand Exercise The sit-to-stand exercise (also known as the chair stand or chair rise exercise) strengthens your lower body and helps you maintain or improve your mobility and independence. The goal is to do the sit-to-stand exercise without using your hands. This will be easier as you become stronger. You should always talk with your health care provider before starting any exercise program, especially if you have had recent surgery. Do the exercise exactly as told by your health care provider and adjust it as directed. It is normal to feel mild stretching, pulling, tightness, or discomfort as you do this exercise, but you should stop right away if you feel sudden pain or your pain gets worse. Do not begin doing this exercise until told by your health care provider. What the sit-to-stand exercise does The sit-to-stand exercise helps to strengthen the muscles in your thighs and the muscles in the center of your body that give you stability (core muscles). This exercise is especially helpful if:  You have had knee or hip surgery.  You have trouble getting up from a chair, out of a car, or off the toilet.  How to do the sit-to-stand exercise 1. Sit toward the front edge of a sturdy chair without armrests. Your knees should be bent and your feet should be flat on the floor and shoulder-width apart. 2. Place your hands lightly on each side of the seat. Keep your back and neck as straight as possible, with your chest slightly forward. 3. Breathe in slowly. Lean forward and slightly shift your weight to the front of your feet. 4. Breathe out as you slowly stand up. Use your hands as little as possible. 5. Stand and pause for a full breath in and out. 6. Breathe in as you sit down slowly. Tighten your core and abdominal  muscles to control your lowering as much as possible. 7. Breathe out slowly. 8. Do this exercise 10-15 times. If needed, do it fewer times until you build up strength. 9. Rest for 1 minute, then do another set of 10-15 repetitions. To change the difficulty of the sit-to-stand exercise  If the exercise is too difficult, use a chair with sturdy armrests, and push off the armrests to help you come to the standing position. You can also use the armrests to help slowly lower yourself back to sitting. As this gets easier, try to use your arms less. You can also place a firm cushion or pillow on the chair to make the surface higher.  If this exercise is too easy, do not use your arms to help raise or lower yourself. You can also wear a weighted vest, use hand weights, increase your repetitions, or try a lower chair. General tips  You may feel tired when starting an exercise routine. This is normal.  You may have muscle soreness that lasts a few days. This is normal. As you get stronger, you may not feel muscle soreness.  Use smooth, steady movements.  Do not  hold your breath during strength exercises. This can cause unsafe changes in your blood pressure.  Breathe in slowly through your nose, and breathe out slowly through your mouth. Summary  Strengthening your lower body is an important step to help you move safely and independently.  The sit-to-stand exercise helps strengthen the muscles in your thighs  and core.  You should always talk with your health care provider before starting any exercise program, especially if you have had recent surgery. This information is not intended to replace advice given to you by your health care provider. Make sure you discuss any questions you have with your health care provider. Document Released: 04/07/2016 Document Revised: 04/07/2016 Document Reviewed: 04/07/2016 Elsevier Interactive Patient Education  2018 [employer]. ___ sessions per day.    http://orth.exer.us/668   Copyright  VHI. All rights reserved.  Heel Raise (Calf Strength / Balance)    Stand with support, __0_ lb weights on ankles. Breathe in. Rise up on tiptoes, breathing out through pursed lips. Hold position to count of _1__. Return slowly, breathing in. Repeat _20__ times per session. Do__1_ sessions per day. Variation: Do without weights.FUNCTIONAL MOBILITY: Squat With UE Support    Stand by chair or table. Stance: shoulder-width on floor. Bend hips and knees. Keep back straight. Do not allow knees to bend past toes. Squeeze glutes and quads to stand. __5_ reps per set, 1-2___ sets per day, 3-4___ days per week   Gastroc Stretch    Stand with right foot back, leg straight, forward leg bent. Keeping heel on floor, turned slightly out, lean into wall until stretch is felt in calf. Hold _30___ seconds. Repeat __3__ times per set. Do 1____ sets per session. Do __1__ sessions per day.  http://orth.exer.us/26   Copyright  VHI. All rights reserved.  Copyright  VHI. All rights reserved.    Copyright  VHI. All rights reserved.
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
## 2017-10-02 DIAGNOSIS — F840 Autistic disorder: Secondary | ICD-10-CM | POA: Diagnosis not present

## 2017-10-06 DIAGNOSIS — Z0279 Encounter for issue of other medical certificate: Secondary | ICD-10-CM

## 2017-10-10 ENCOUNTER — Encounter: Payer: Self-pay | Admitting: Physical Therapy

## 2017-10-10 ENCOUNTER — Ambulatory Visit: Payer: BLUE CROSS/BLUE SHIELD | Attending: Orthopaedic Surgery | Admitting: Physical Therapy

## 2017-10-10 DIAGNOSIS — M25561 Pain in right knee: Secondary | ICD-10-CM | POA: Diagnosis not present

## 2017-10-10 DIAGNOSIS — R262 Difficulty in walking, not elsewhere classified: Secondary | ICD-10-CM

## 2017-10-10 DIAGNOSIS — M6281 Muscle weakness (generalized): Secondary | ICD-10-CM | POA: Diagnosis not present
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
## 2017-10-13 ENCOUNTER — Ambulatory Visit: Payer: BLUE CROSS/BLUE SHIELD | Admitting: Physical Therapy

## 2017-10-13 ENCOUNTER — Encounter: Payer: Self-pay | Admitting: Physical Therapy

## 2017-10-13 DIAGNOSIS — R262 Difficulty in walking, not elsewhere classified: Secondary | ICD-10-CM

## 2017-10-13 DIAGNOSIS — M6281 Muscle weakness (generalized): Secondary | ICD-10-CM | POA: Diagnosis not present

## 2017-10-13 DIAGNOSIS — M25561 Pain in right knee: Secondary | ICD-10-CM | POA: Diagnosis not present
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
## 2017-10-16 ENCOUNTER — Encounter: Payer: Self-pay | Admitting: Physical Therapy

## 2017-10-16 ENCOUNTER — Ambulatory Visit: Payer: BLUE CROSS/BLUE SHIELD | Admitting: Physical Therapy

## 2017-10-16 DIAGNOSIS — M6281 Muscle weakness (generalized): Secondary | ICD-10-CM | POA: Diagnosis not present

## 2017-10-16 DIAGNOSIS — M25561 Pain in right knee: Secondary | ICD-10-CM

## 2017-10-16 DIAGNOSIS — R262 Difficulty in walking, not elsewhere classified: Secondary | ICD-10-CM | POA: Diagnosis not present
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
## 2017-10-17 DIAGNOSIS — F840 Autistic disorder: Secondary | ICD-10-CM | POA: Diagnosis not present

## 2017-10-18 ENCOUNTER — Ambulatory Visit: Payer: BLUE CROSS/BLUE SHIELD | Admitting: Physical Therapy

## 2017-10-18 ENCOUNTER — Encounter: Payer: Self-pay | Admitting: Physical Therapy

## 2017-10-18 DIAGNOSIS — M6281 Muscle weakness (generalized): Secondary | ICD-10-CM

## 2017-10-18 DIAGNOSIS — M25561 Pain in right knee: Secondary | ICD-10-CM

## 2017-10-18 DIAGNOSIS — R262 Difficulty in walking, not elsewhere classified: Secondary | ICD-10-CM | POA: Diagnosis not present
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
## 2017-10-23 ENCOUNTER — Ambulatory Visit: Payer: BLUE CROSS/BLUE SHIELD | Admitting: Physical Therapy

## 2017-10-23 ENCOUNTER — Encounter: Payer: Self-pay | Admitting: Physical Therapy

## 2017-10-23 DIAGNOSIS — M6281 Muscle weakness (generalized): Secondary | ICD-10-CM | POA: Diagnosis not present

## 2017-10-23 DIAGNOSIS — R262 Difficulty in walking, not elsewhere classified: Secondary | ICD-10-CM

## 2017-10-23 DIAGNOSIS — M25561 Pain in right knee: Secondary | ICD-10-CM | POA: Diagnosis not present
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
## 2017-10-25 ENCOUNTER — Encounter: Payer: Self-pay | Admitting: Physical Therapy

## 2017-10-25 ENCOUNTER — Ambulatory Visit: Payer: BLUE CROSS/BLUE SHIELD | Admitting: Physical Therapy

## 2017-10-25 DIAGNOSIS — M6281 Muscle weakness (generalized): Secondary | ICD-10-CM | POA: Diagnosis not present

## 2017-10-25 DIAGNOSIS — M25561 Pain in right knee: Secondary | ICD-10-CM | POA: Diagnosis not present

## 2017-10-25 DIAGNOSIS — R262 Difficulty in walking, not elsewhere classified: Secondary | ICD-10-CM
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
## 2017-10-31 ENCOUNTER — Encounter: Payer: Self-pay | Admitting: Physical Therapy

## 2017-10-31 ENCOUNTER — Ambulatory Visit: Payer: BLUE CROSS/BLUE SHIELD | Attending: Orthopaedic Surgery | Admitting: Physical Therapy

## 2017-10-31 DIAGNOSIS — M25561 Pain in right knee: Secondary | ICD-10-CM | POA: Diagnosis not present

## 2017-10-31 DIAGNOSIS — F840 Autistic disorder: Secondary | ICD-10-CM | POA: Diagnosis not present

## 2017-10-31 DIAGNOSIS — M6281 Muscle weakness (generalized): Secondary | ICD-10-CM | POA: Diagnosis not present

## 2017-10-31 DIAGNOSIS — R262 Difficulty in walking, not elsewhere classified: Secondary | ICD-10-CM
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
## 2017-11-02 ENCOUNTER — Encounter: Payer: Self-pay | Admitting: Physical Therapy

## 2017-11-02 ENCOUNTER — Ambulatory Visit: Payer: BLUE CROSS/BLUE SHIELD | Admitting: Physical Therapy

## 2017-11-02 DIAGNOSIS — R262 Difficulty in walking, not elsewhere classified: Secondary | ICD-10-CM

## 2017-11-02 DIAGNOSIS — M25561 Pain in right knee: Secondary | ICD-10-CM

## 2017-11-02 DIAGNOSIS — M6281 Muscle weakness (generalized): Secondary | ICD-10-CM
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
## 2017-11-03 ENCOUNTER — Encounter (INDEPENDENT_AMBULATORY_CARE_PROVIDER_SITE_OTHER): Payer: Self-pay | Admitting: Orthopaedic Surgery

## 2017-11-03 ENCOUNTER — Ambulatory Visit (INDEPENDENT_AMBULATORY_CARE_PROVIDER_SITE_OTHER): Payer: BLUE CROSS/BLUE SHIELD | Admitting: Orthopaedic Surgery

## 2017-11-03 VITALS — BP 136/86 | HR 72 | Ht 70.0 in | Wt 249.0 lb

## 2017-11-03 DIAGNOSIS — M25572 Pain in left ankle and joints of left foot: Secondary | ICD-10-CM

## 2017-11-03 DIAGNOSIS — M25561 Pain in right knee: Secondary | ICD-10-CM

## 2017-11-03 DIAGNOSIS — G8929 Other chronic pain: Secondary | ICD-10-CM | POA: Diagnosis not present

## 2017-11-03 DIAGNOSIS — M25571 Pain in right ankle and joints of right foot: Secondary | ICD-10-CM
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
## 2017-11-06 ENCOUNTER — Encounter: Payer: Self-pay | Admitting: Physical Therapy

## 2017-11-06 ENCOUNTER — Ambulatory Visit: Payer: BLUE CROSS/BLUE SHIELD | Admitting: Physical Therapy

## 2017-11-06 DIAGNOSIS — M6281 Muscle weakness (generalized): Secondary | ICD-10-CM

## 2017-11-06 DIAGNOSIS — R262 Difficulty in walking, not elsewhere classified: Secondary | ICD-10-CM | POA: Diagnosis not present

## 2017-11-06 DIAGNOSIS — M25561 Pain in right knee: Secondary | ICD-10-CM | POA: Diagnosis not present
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
## 2017-11-08 ENCOUNTER — Ambulatory Visit: Payer: BLUE CROSS/BLUE SHIELD | Admitting: Medical

## 2017-11-08 ENCOUNTER — Encounter: Payer: Self-pay | Admitting: Medical

## 2017-11-08 ENCOUNTER — Other Ambulatory Visit: Payer: Self-pay | Admitting: Medical

## 2017-11-08 VITALS — BP 130/84 | HR 73 | Temp 98.3°F | Resp 16 | Ht 70.0 in | Wt 261.6 lb

## 2017-11-08 DIAGNOSIS — E569 Vitamin deficiency, unspecified: Secondary | ICD-10-CM

## 2017-11-08 DIAGNOSIS — E559 Vitamin D deficiency, unspecified: Secondary | ICD-10-CM

## 2017-11-08 DIAGNOSIS — E785 Hyperlipidemia, unspecified: Secondary | ICD-10-CM

## 2017-11-08 DIAGNOSIS — R202 Paresthesia of skin: Secondary | ICD-10-CM

## 2017-11-08 DIAGNOSIS — G4730 Sleep apnea, unspecified: Secondary | ICD-10-CM

## 2017-11-08 DIAGNOSIS — M1711 Unilateral primary osteoarthritis, right knee: Secondary | ICD-10-CM

## 2017-11-08 DIAGNOSIS — Z23 Encounter for immunization: Secondary | ICD-10-CM

## 2017-11-08 DIAGNOSIS — I10 Essential (primary) hypertension: Secondary | ICD-10-CM

## 2017-11-08 MED ORDER — LOSARTAN POTASSIUM-HCTZ 100-12.5 MG PO TABS: 1.0000 | ORAL_TABLET | Freq: Every day | ORAL | 3 refills | Status: DC

## 2017-11-08 NOTE — Addendum Note (Signed)
Addended by: Gwinda Maine on: 11/08/2017 10:07 AM   Modules accepted: Orders
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
## 2017-11-09 ENCOUNTER — Other Ambulatory Visit: Payer: Self-pay | Admitting: Medical

## 2017-11-09 ENCOUNTER — Ambulatory Visit: Payer: BLUE CROSS/BLUE SHIELD | Admitting: Physical Therapy

## 2017-11-09 DIAGNOSIS — M542 Cervicalgia: Secondary | ICD-10-CM

## 2017-11-09 DIAGNOSIS — R202 Paresthesia of skin: Secondary | ICD-10-CM

## 2017-11-09 DIAGNOSIS — M79602 Pain in left arm: Secondary | ICD-10-CM

## 2017-11-09 DIAGNOSIS — M79601 Pain in right arm: Secondary | ICD-10-CM

## 2017-11-09 DIAGNOSIS — R200 Anesthesia of skin: Secondary | ICD-10-CM

## 2017-11-09 LAB — LIPID PANEL
CHOL/HDL RATIO: 3 ratio (ref 0.0–5.0)
Cholesterol, Total: 132 mg/dL (ref 100–199)
HDL: 44 mg/dL (ref >39)
LDL CALC: 74 mg/dL (ref 0–99)
Triglycerides: 72 mg/dL (ref 0–149)
VLDL Cholesterol Cal: 14 mg/dL (ref 5–40)

## 2017-11-09 LAB — VITAMIN B12: Vitamin B-12: 1433 pg/mL — ABNORMAL HIGH (ref 232–1245)

## 2017-11-09 MED ORDER — ROSUVASTATIN CALCIUM 10 MG PO TABS: ORAL_TABLET | ORAL | 1 refills | Status: DC

## 2017-11-13 DIAGNOSIS — F840 Autistic disorder: Secondary | ICD-10-CM | POA: Diagnosis not present

## 2017-11-27 ENCOUNTER — Encounter (HOSPITAL_COMMUNITY): Payer: Self-pay | Admitting: Emergency Medicine

## 2017-11-27 ENCOUNTER — Ambulatory Visit (HOSPITAL_COMMUNITY)
Admission: EM | Admit: 2017-11-27 | Discharge: 2017-11-27 | Disposition: A | Discharge status: Home / Self Care | Payer: BLUE CROSS/BLUE SHIELD | Attending: Family Medicine | Admitting: Family Medicine

## 2017-11-27 DIAGNOSIS — J0100 Acute maxillary sinusitis, unspecified: Secondary | ICD-10-CM | POA: Diagnosis not present

## 2017-11-27 DIAGNOSIS — F840 Autistic disorder: Secondary | ICD-10-CM | POA: Diagnosis not present

## 2017-11-27 MED ORDER — AMOXICILLIN-POT CLAVULANATE 875-125 MG PO TABS: 1.0000 | ORAL_TABLET | Freq: Two times a day (BID) | ORAL | 0 refills | Status: AC

## 2017-11-27 MED ORDER — FLUTICASONE PROPIONATE 50 MCG/ACT NA SUSP: 1.0000 | Freq: Every day | NASAL | 2 refills | Status: DC

## 2017-11-27 NOTE — Discharge Instructions (Addendum)
Push fluids to ensure adequate hydration and keep secretions thin.  Tylenol and/or ibuprofen as needed for pain or fevers.  Daily flonase.  Complete course of antibiotics.  If symptoms worsen or do not improve in the next week to return to be seen or to follow up with your PCP.

## 2017-11-27 NOTE — ED Triage Notes (Signed)
Pt c/o cough, sore throat, no energy, cold symptoms x4 days
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
## 2017-12-11 DIAGNOSIS — F840 Autistic disorder: Secondary | ICD-10-CM | POA: Diagnosis not present

## 2017-12-13 DIAGNOSIS — Z0289 Encounter for other administrative examinations: Secondary | ICD-10-CM

## 2017-12-20 ENCOUNTER — Ambulatory Visit: Payer: BLUE CROSS/BLUE SHIELD | Admitting: Medical

## 2017-12-20 VITALS — BP 130/90 | HR 75 | Temp 98.0°F | Resp 16 | Ht 70.0 in | Wt 259.2 lb

## 2017-12-20 DIAGNOSIS — M542 Cervicalgia: Secondary | ICD-10-CM

## 2017-12-20 DIAGNOSIS — L729 Follicular cyst of the skin and subcutaneous tissue, unspecified: Secondary | ICD-10-CM

## 2017-12-20 DIAGNOSIS — R202 Paresthesia of skin: Secondary | ICD-10-CM | POA: Diagnosis not present

## 2017-12-20 DIAGNOSIS — M79602 Pain in left arm: Secondary | ICD-10-CM

## 2017-12-20 DIAGNOSIS — R079 Chest pain, unspecified: Secondary | ICD-10-CM

## 2017-12-20 NOTE — Patient Instructions (Signed)
Your symptoms today suggest muscle inflammation of the left upper back and arm, however I cannot rule out a neck issue or other cause  You also appear to have some type of cystic lesion on your forearm left arm    Recommendations:  Please go to [facility] for your neck xray.   Their hours are 8am - 4:30 pm Monday - Friday.  Take your insurance card with you.  [facility] [phone]  [place] [address], [place], [place] [address]  [address], [place] [address]   Regarding the cystic lesion of your left forearm, let us use a watch and wait approach.  If it is getting bigger or worse or changing the next 2 months and we will have you see general surgery.  I suspect this is either a fatty tumor or a cyst.  either way I think it is benign  I recommend you do gentle stretching daily with the neck shoulders and arms.  You can use heat pad to the upper back  Consider getting a massage of your neck and arms  You can use over-the-counter Aleve 1 to 2 tablets up to twice daily for the next 7 to 10 days

## 2017-12-20 NOTE — Progress Notes (Signed)
Subjective: Chief Complaint  Patient presents with  . shoulder pain    left shoulder pain tingling feeling to fingers X 1 1/2 month feels like he is over heating   Here for 1. 5 month hx/o ongoing left shoulder pain, sensation of tinglings down to fingertips left side.    He does note some left neck pain, worse pain using the arm, picking things up and activities at work.  He is right-handed.  No swelling.  No rash no bruising or erythema.  He never went for the neck x-ray discussed last visit.  He did end up seeing orthopedics about his knee pain did not mention this pain in the arm or neck.  He does feel some numbness in the left arm, no weakness.   He mentions some chest pain radiating to the arm so CMA did an EKG before I evaluated him  No other aggravating or relieving factors. No other complaint.   Past Medical History:  Diagnosis Date  . Anemia 2009   noted during hospitalization for surgical I&D of chest, arm abscess  . Anxiety   . Appendicitis   . Arthritis   . Depression   . Dyspnea    with anxiety and some exertion  . Gastric tumor 2013   resection  . GERD (gastroesophageal reflux disease)   . GI bleed 2013  . H/O seasonal allergies   . High cholesterol   . History of hiatal hernia   . Hypertension   . IBS (irritable bowel syndrome)   . Pneumonia   . Sleep apnea    has a cpap machine but seldom uses it.    Current Outpatient Medications on File Prior to Visit  Medication Sig Dispense Refill  . acetaminophen (TYLENOL) 500 MG tablet Take 1,000 mg by mouth every 6 (six) hours as needed (pain). Reported on 08/05/2015    . Ascorbic Acid (VITAMIN C) 1000 MG tablet Take 1,000 mg by mouth daily.    . Cholecalciferol (VITAMIN D3) 2000 units TABS Take 2,000 Units by mouth daily.    . Coenzyme Q10 (CO Q10) 100 MG CAPS Take by mouth.    . Cyanocobalamin (VITAMIN B-12) 5000 MCG SUBL Place 5,000 mcg under the tongue daily.    . fluticasone (FLONASE) 50 MCG/ACT nasal spray Place  1 spray into both nostrils daily. 16 g 2  . ibuprofen (ADVIL,MOTRIN) 200 MG tablet Take 400 mg by mouth every 8 (eight) hours as needed (for pain.).    Marland Kitchen losartan-hydrochlorothiazide (HYZAAR) 100-12.5 MG tablet Take 1 tablet by mouth daily. 90 tablet 3  . Multiple Vitamin (MULITIVITAMIN WITH MINERALS) TABS Take 1 tablet by mouth daily.    . Polyethylene Glycol 3350 (MIRALAX PO) Take by mouth as needed.     Marland Kitchen POTASSIUM GLUCONATE PO Take by mouth.    . rosuvastatin (CRESTOR) 10 MG tablet TAKE 1 TABLET(10 MG) BY MOUTH AT BEDTIME 90 tablet 1  . tamsulosin (FLOMAX) 0.4 MG CAPS capsule Take 0.4 mg by mouth daily as needed (for urinary retention).   1  . linaclotide (LINZESS) 72 MCG capsule Take 1 capsule (72 mcg total) by mouth daily before breakfast. (Patient not taking: Reported on 09/15/2017) 16 capsule 0  . omeprazole (PRILOSEC) 40 MG capsule Take 1 capsule (40 mg total) by mouth daily. (Patient not taking: Reported on 11/08/2017) 30 capsule 3  . oxyCODONE (OXY IR/ROXICODONE) 5 MG immediate release tablet Take 1-2 tablets (5-10 mg total) by mouth every 4 (four) hours as needed for moderate  pain or severe pain. (Patient not taking: Reported on 11/08/2017) 40 tablet 0   No current facility-administered medications on file prior to visit.    ROS as in subjective   Objective: BP 130/90   Pulse 75   Temp 98 F (36.7 C) (Oral)   Resp 16   Ht 5\' 10"  (1.778 m)   Wt 259 lb 3.2 oz (117.6 kg)   SpO2 97%   BMI 37.19 kg/m   Gen: wd, wn, nad Skin: No erythema no bruising or rash, on the proximal medial left forearm there is a approximately a 2 cm round cystic lesion without redness without tenderness. Neck nontender, normal range of motion no deformity no thyromegaly Lungs clear Heart rrr, normal s1, s2 , no murmurs Chest wall nontender Left arm tender over left forearm medially, otherwise nontender, range of motion seems full Interestingly he seems to be weak with left grip strength, however rest  of arm strength seems normal, sensation and pulses are normal. He seems to be in pain with resisted left shoulder flexion however normal resistance without pain with other strength and range of motion testing, out of proportion to expectation    Adult ECG Report  Indication: chest pain  Rate: 73 bpm  Rhythm: normal sinus rhythm  QRS Axis: 54 degrees  PR Interval: 12ms  QRS Duration: 70ms  QTc: 473ms  Conduction Disturbances: none  Other Abnormalities: none  Patient's cardiac risk factors are: hypertension and male gender.  EKG comparison: no change compared to 05/2017 EKG  Narrative Interpretation: no acute changes    Assessment: Encounter Diagnoses  Name Primary?  . Neck pain Yes  . Left arm pain   . Chest pain, unspecified type   . Paresthesia     Plan: We discussed his symptoms and exam findings and reviewed the instructions below  I advised to go ahead and get the next x-ray we discussed last visit  He just completed physical therapy regarding his knee and saw orthopedics for that.  Pending x-ray I may have him try massage therapy.  Patient Instructions  Your symptoms today suggest muscle inflammation of the left upper back and arm, however I cannot rule out a neck issue or other cause  You also appear to have some type of cystic lesion on your forearm left arm    Recommendations:  Please go to [facility] for your neck xray.   Their hours are 8am - 4:30 pm Monday - Friday.  Take your insurance card with you.  [facility] [phone]  [place] [address], [place], [place] [address]  [address], [place] [address]   Regarding the cystic lesion of your left forearm, let us use a watch and wait approach.  If it is getting bigger or worse or changing the next 2 months and we will have you see general surgery.  I suspect this is either a fatty tumor or a cyst.  either way I think it is benign  I recommend you do gentle stretching  daily with the neck shoulders and arms.  You can use heat pad to the upper back  Consider getting a massage of your neck and arms  You can use over-the-counter Aleve 1 to 2 tablets up to twice daily for the next 7 to 10 days     Juergen was seen today for shoulder pain.  Diagnoses and all orders for this visit:  Neck pain  Left arm pain  Chest pain, unspecified type  Paresthesia

## 2017-12-21 ENCOUNTER — Ambulatory Visit
Admission: RE | Admit: 2017-12-21 | Discharge: 2017-12-21 | Disposition: A | Discharge status: Home / Self Care | Payer: BLUE CROSS/BLUE SHIELD | Source: Ambulatory Visit | Attending: Medical | Admitting: Medical

## 2017-12-21 ENCOUNTER — Telehealth (INDEPENDENT_AMBULATORY_CARE_PROVIDER_SITE_OTHER): Payer: Self-pay | Admitting: Orthopaedic Surgery

## 2017-12-21 DIAGNOSIS — R202 Paresthesia of skin: Secondary | ICD-10-CM

## 2017-12-21 DIAGNOSIS — M542 Cervicalgia: Secondary | ICD-10-CM

## 2017-12-21 DIAGNOSIS — M79601 Pain in right arm: Secondary | ICD-10-CM

## 2017-12-21 DIAGNOSIS — R200 Anesthesia of skin: Secondary | ICD-10-CM

## 2017-12-21 DIAGNOSIS — M79602 Pain in left arm: Secondary | ICD-10-CM

## 2017-12-21 NOTE — Telephone Encounter (Signed)
6/17&7/19 ov notes and billing for those dos including 5/7 emailed to [facility] @ [facility]

## 2017-12-26 DIAGNOSIS — F840 Autistic disorder: Secondary | ICD-10-CM | POA: Diagnosis not present

## 2018-01-08 DIAGNOSIS — F840 Autistic disorder: Secondary | ICD-10-CM | POA: Diagnosis not present

## 2018-01-11 ENCOUNTER — Other Ambulatory Visit: Payer: Self-pay

## 2018-01-11 ENCOUNTER — Encounter: Payer: Self-pay | Admitting: Medical

## 2018-01-11 ENCOUNTER — Ambulatory Visit: Payer: BLUE CROSS/BLUE SHIELD | Admitting: Medical

## 2018-01-11 VITALS — BP 126/80 | HR 70 | Temp 98.0°F | Resp 16 | Ht 70.0 in | Wt 261.0 lb

## 2018-01-11 DIAGNOSIS — M79602 Pain in left arm: Secondary | ICD-10-CM | POA: Diagnosis not present

## 2018-01-11 DIAGNOSIS — R202 Paresthesia of skin: Secondary | ICD-10-CM

## 2018-01-11 DIAGNOSIS — M19012 Primary osteoarthritis, left shoulder: Secondary | ICD-10-CM

## 2018-01-11 DIAGNOSIS — M542 Cervicalgia: Secondary | ICD-10-CM

## 2018-01-11 DIAGNOSIS — R200 Anesthesia of skin: Secondary | ICD-10-CM

## 2018-01-11 MED ORDER — HYDROCODONE-ACETAMINOPHEN 5-325 MG PO TABS: 1.0000 | ORAL_TABLET | Freq: Two times a day (BID) | ORAL | 0 refills | Status: DC | PRN

## 2018-01-11 NOTE — Progress Notes (Signed)
Subjective: Chief Complaint  Patient presents with  . shoulder pain    shoulder pain left  had shoulder surgery 10-15 years ago   Here for left shoulder pain.  Hx/o shoulder surgery 10-15 years ago.  No recent injury, trauma or fall.  I saw him 12/20/17 for neck pain, left arm and chest pain seemed to be related to muscle inflammation.   He saw massage therapy recently, and he note the therapist said that he was having tension in his upper back and neck.   Using some Aleve OTC.  Left shoulder pain just hurts regardless of activity, but sometimes stretching helps.  Has tingling sensation down left arm into hand.  Has pain that awakes him at night.  Hard to get comfortable.  From last visit he is still having neck pain upper back pain as well.  He is physically active on the job and his pain is not improving things.  No other aggravating or relieving factors. No other complaint.   Past Surgical History:  Procedure Laterality Date  . COLONOSCOPY    . ESOPHAGOGASTRODUODENOSCOPY  05/05/2011   Procedure: ESOPHAGOGASTRODUODENOSCOPY (EGD);  Surgeon: Irene Shipper, MD;  Location: [facility] ENDOSCOPY;  Service: Endoscopy;  Laterality: N/A;  . GASTRECTOMY    . INCISE AND DRAIN ABCESS  2009   patient had abscesses and cellulitis of the chest and arm which grew out microfollicular strap.  Marland Kitchen KNEE ARTHROPLASTY    . KNEE ARTHROSCOPY Right 07/04/2017   WITH DEBRIDMENT  . KNEE ARTHROSCOPY Right 07/04/2017   Procedure: RIGHT KNEE ARTHROSCOPY WITH DEBRIDEMENT, MEDIAL MENISCECTOMY;  Surgeon: Garald Balding, MD;  Location: [facility];  Service: Orthopedics;  Laterality: Right;  . TONSILLECTOMY    . TOTAL SHOULDER ARTHROPLASTY     ROS as in subjective   Objective: BP 126/80   Pulse 70   Temp 98 F (36.7 C) (Oral)   Resp 16   Ht 5\' 10"  (1.778 m)   Wt 261 lb (118.4 kg)   SpO2 97%   BMI 37.45 kg/m   Gen: wd, wn, nad Skin unremarkable Somewhat tender left posterior shoulder but nontender to palpation of anterior  shoulder or AC joint, exam is somewhat difficult getting spastic activity in general, but he seems to be in pain with shoulder range of motion in general No swelling noted no deformity He seems to have pain with all the special test, range of motion is relatively full though Neck with mild tenderness in general, he has relatively full neck range of motion but again seems to be in pain with range of motion in general Upper extremity pulses normal Sensation normal in the upper extremities Strength seems within normal limits bilateral upper extremities although he seems to be either guarded or pain limiting his resistance left arm strength testing    Assessment: Encounter Diagnoses  Name Primary?  . Left arm pain Yes  . Neck pain   . Arthropathy of left shoulder   . Paresthesia      Plan: At this point it makes more sense to refer him back to orthopedist for other evaluation and treatment, so we will refer back to Dr. Durward Fortes who he saw this past year for knee issues  He can use Aleve currently over-the-counter twice daily, pain medicine below short-term as needed for worse pain  I am glad he established with a massage therapist that he can use as needed   Brandi was seen today for shoulder pain.  Diagnoses and all orders for  this visit:  Left arm pain -     Ambulatory referral to Orthopedic Surgery  Neck pain -     Ambulatory referral to Orthopedic Surgery  Arthropathy of left shoulder -     Ambulatory referral to Orthopedic Surgery  Paresthesia  Other orders -     HYDROcodone-acetaminophen (NORCO/VICODIN) 5-325 MG tablet; Take 1 tablet by mouth 2 (two) times daily as needed.

## 2018-01-16 ENCOUNTER — Ambulatory Visit (INDEPENDENT_AMBULATORY_CARE_PROVIDER_SITE_OTHER): Payer: BLUE CROSS/BLUE SHIELD | Admitting: Orthopaedic Surgery

## 2018-01-16 ENCOUNTER — Encounter (INDEPENDENT_AMBULATORY_CARE_PROVIDER_SITE_OTHER): Payer: Self-pay | Admitting: Orthopaedic Surgery

## 2018-01-16 ENCOUNTER — Ambulatory Visit (INDEPENDENT_AMBULATORY_CARE_PROVIDER_SITE_OTHER): Payer: Self-pay

## 2018-01-16 VITALS — Ht 70.0 in | Wt 259.0 lb

## 2018-01-16 DIAGNOSIS — M542 Cervicalgia: Secondary | ICD-10-CM | POA: Diagnosis not present

## 2018-01-16 DIAGNOSIS — M25512 Pain in left shoulder: Secondary | ICD-10-CM | POA: Diagnosis not present

## 2018-01-16 MED ORDER — BUPIVACAINE HCL 0.5 % IJ SOLN
2.0000 mL | INTRAMUSCULAR | Status: AC | PRN
  Administered 2018-01-16: 2 mL via INTRA_ARTICULAR

## 2018-01-16 MED ORDER — METHYLPREDNISOLONE ACETATE 40 MG/ML IJ SUSP
80.0000 mg | INTRAMUSCULAR | Status: AC | PRN
  Administered 2018-01-16: 80 mg

## 2018-01-16 MED ORDER — LIDOCAINE HCL 2 % IJ SOLN
2.0000 mL | INTRAMUSCULAR | Status: AC | PRN
  Administered 2018-01-16: 2 mL
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
## 2018-01-22 DIAGNOSIS — F840 Autistic disorder: Secondary | ICD-10-CM | POA: Diagnosis not present

## 2018-01-23 ENCOUNTER — Ambulatory Visit (INDEPENDENT_AMBULATORY_CARE_PROVIDER_SITE_OTHER): Payer: BLUE CROSS/BLUE SHIELD | Admitting: Orthopaedic Surgery

## 2018-01-29 ENCOUNTER — Ambulatory Visit (INDEPENDENT_AMBULATORY_CARE_PROVIDER_SITE_OTHER): Payer: BLUE CROSS/BLUE SHIELD | Admitting: Orthopaedic Surgery

## 2018-01-29 ENCOUNTER — Encounter (INDEPENDENT_AMBULATORY_CARE_PROVIDER_SITE_OTHER): Payer: Self-pay | Admitting: Orthopaedic Surgery

## 2018-01-29 VITALS — BP 154/93 | HR 76 | Ht 70.0 in | Wt 259.0 lb

## 2018-01-29 DIAGNOSIS — M25512 Pain in left shoulder: Secondary | ICD-10-CM | POA: Diagnosis not present

## 2018-01-29 DIAGNOSIS — G8929 Other chronic pain: Secondary | ICD-10-CM | POA: Diagnosis not present

## 2018-01-29 DIAGNOSIS — M542 Cervicalgia: Secondary | ICD-10-CM | POA: Diagnosis not present

## 2018-01-29 MED ORDER — METHYLPREDNISOLONE 4 MG PO TBPK: ORAL_TABLET | ORAL | 0 refills | Status: DC

## 2018-01-29 NOTE — Progress Notes (Signed)
Am
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
## 2018-02-05 DIAGNOSIS — F840 Autistic disorder: Secondary | ICD-10-CM | POA: Diagnosis not present

## 2018-02-06 ENCOUNTER — Ambulatory Visit: Payer: BLUE CROSS/BLUE SHIELD | Attending: Orthopaedic Surgery | Admitting: Physical Therapy

## 2018-02-06 ENCOUNTER — Encounter: Payer: Self-pay | Admitting: Physical Therapy

## 2018-02-06 ENCOUNTER — Other Ambulatory Visit: Payer: Self-pay

## 2018-02-06 DIAGNOSIS — R293 Abnormal posture: Secondary | ICD-10-CM

## 2018-02-06 DIAGNOSIS — M542 Cervicalgia: Secondary | ICD-10-CM | POA: Insufficient documentation

## 2018-02-06 DIAGNOSIS — M62838 Other muscle spasm: Secondary | ICD-10-CM | POA: Insufficient documentation
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
## 2018-02-12 ENCOUNTER — Encounter (INDEPENDENT_AMBULATORY_CARE_PROVIDER_SITE_OTHER): Payer: Self-pay | Admitting: Orthopaedic Surgery

## 2018-02-12 ENCOUNTER — Ambulatory Visit (INDEPENDENT_AMBULATORY_CARE_PROVIDER_SITE_OTHER): Payer: BLUE CROSS/BLUE SHIELD | Admitting: Orthopaedic Surgery

## 2018-02-12 VITALS — BP 127/74 | HR 87 | Ht 70.0 in | Wt 240.0 lb

## 2018-02-12 DIAGNOSIS — M542 Cervicalgia: Secondary | ICD-10-CM | POA: Diagnosis not present
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
## 2018-02-14 ENCOUNTER — Encounter: Payer: Self-pay | Admitting: Physical Therapy

## 2018-02-14 ENCOUNTER — Ambulatory Visit: Payer: BLUE CROSS/BLUE SHIELD | Admitting: Physical Therapy

## 2018-02-14 DIAGNOSIS — M542 Cervicalgia: Secondary | ICD-10-CM

## 2018-02-14 DIAGNOSIS — R293 Abnormal posture: Secondary | ICD-10-CM | POA: Diagnosis not present

## 2018-02-14 DIAGNOSIS — M62838 Other muscle spasm: Secondary | ICD-10-CM

## 2018-02-14 NOTE — Patient Instructions (Signed)
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
## 2018-02-16 ENCOUNTER — Encounter: Payer: Self-pay | Admitting: Physical Therapy

## 2018-02-16 ENCOUNTER — Ambulatory Visit: Payer: BLUE CROSS/BLUE SHIELD | Admitting: Physical Therapy

## 2018-02-16 DIAGNOSIS — M542 Cervicalgia: Secondary | ICD-10-CM | POA: Diagnosis not present

## 2018-02-16 DIAGNOSIS — M62838 Other muscle spasm: Secondary | ICD-10-CM

## 2018-02-16 DIAGNOSIS — R293 Abnormal posture: Secondary | ICD-10-CM | POA: Diagnosis not present
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
## 2018-02-19 DIAGNOSIS — F840 Autistic disorder: Secondary | ICD-10-CM | POA: Diagnosis not present

## 2018-02-20 ENCOUNTER — Ambulatory Visit: Payer: BLUE CROSS/BLUE SHIELD | Admitting: Physical Therapy

## 2018-02-20 ENCOUNTER — Encounter: Payer: Self-pay | Admitting: Physical Therapy

## 2018-02-20 DIAGNOSIS — M62838 Other muscle spasm: Secondary | ICD-10-CM

## 2018-02-20 DIAGNOSIS — M542 Cervicalgia: Secondary | ICD-10-CM | POA: Diagnosis not present

## 2018-02-20 DIAGNOSIS — R293 Abnormal posture: Secondary | ICD-10-CM
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
## 2018-02-20 NOTE — Patient Instructions (Signed)
In bed with knees bent,  Support arms with pillows and neck with rolled towel/ pillow     Press back of shoulders into bed, while making a fist in both hands.   Hold 5 seconds repeat 10 X.  Do not do if pain increases into arm.

## 2018-02-26 ENCOUNTER — Ambulatory Visit: Payer: BLUE CROSS/BLUE SHIELD | Admitting: Physical Therapy

## 2018-02-26 ENCOUNTER — Encounter: Payer: Self-pay | Admitting: Physical Therapy

## 2018-02-26 DIAGNOSIS — R293 Abnormal posture: Secondary | ICD-10-CM

## 2018-02-26 DIAGNOSIS — M62838 Other muscle spasm: Secondary | ICD-10-CM

## 2018-02-26 DIAGNOSIS — M542 Cervicalgia: Secondary | ICD-10-CM
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
## 2018-03-01 ENCOUNTER — Ambulatory Visit: Payer: BLUE CROSS/BLUE SHIELD | Admitting: Physical Therapy

## 2018-03-02 ENCOUNTER — Ambulatory Visit: Payer: BLUE CROSS/BLUE SHIELD | Attending: Orthopaedic Surgery | Admitting: Physical Therapy

## 2018-03-02 ENCOUNTER — Encounter: Payer: Self-pay | Admitting: Physical Therapy

## 2018-03-02 DIAGNOSIS — M542 Cervicalgia: Secondary | ICD-10-CM | POA: Diagnosis not present

## 2018-03-02 DIAGNOSIS — M62838 Other muscle spasm: Secondary | ICD-10-CM | POA: Insufficient documentation

## 2018-03-02 DIAGNOSIS — R293 Abnormal posture: Secondary | ICD-10-CM | POA: Diagnosis not present
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
## 2018-03-05 ENCOUNTER — Ambulatory Visit: Payer: BLUE CROSS/BLUE SHIELD | Admitting: Physical Therapy

## 2018-03-05 DIAGNOSIS — F840 Autistic disorder: Secondary | ICD-10-CM | POA: Diagnosis not present

## 2018-03-07 ENCOUNTER — Ambulatory Visit: Payer: BLUE CROSS/BLUE SHIELD | Admitting: Physical Therapy

## 2018-03-15 ENCOUNTER — Encounter (INDEPENDENT_AMBULATORY_CARE_PROVIDER_SITE_OTHER): Payer: Self-pay | Admitting: Orthopaedic Surgery

## 2018-03-15 ENCOUNTER — Ambulatory Visit (INDEPENDENT_AMBULATORY_CARE_PROVIDER_SITE_OTHER): Payer: BLUE CROSS/BLUE SHIELD | Admitting: Orthopaedic Surgery

## 2018-03-15 ENCOUNTER — Other Ambulatory Visit (INDEPENDENT_AMBULATORY_CARE_PROVIDER_SITE_OTHER): Payer: Self-pay | Admitting: *Deleted

## 2018-03-15 DIAGNOSIS — M542 Cervicalgia: Secondary | ICD-10-CM
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
## 2018-03-19 DIAGNOSIS — F840 Autistic disorder: Secondary | ICD-10-CM | POA: Diagnosis not present

## 2018-03-23 ENCOUNTER — Ambulatory Visit
Admission: RE | Admit: 2018-03-23 | Discharge: 2018-03-23 | Disposition: A | Discharge status: Home / Self Care | Payer: BLUE CROSS/BLUE SHIELD | Source: Ambulatory Visit | Attending: Orthopaedic Surgery | Admitting: Orthopaedic Surgery

## 2018-03-23 DIAGNOSIS — M5030 Other cervical disc degeneration, unspecified cervical region: Secondary | ICD-10-CM | POA: Diagnosis not present

## 2018-03-23 DIAGNOSIS — M542 Cervicalgia: Secondary | ICD-10-CM

## 2018-04-02 DIAGNOSIS — F840 Autistic disorder: Secondary | ICD-10-CM | POA: Diagnosis not present

## 2018-04-05 ENCOUNTER — Other Ambulatory Visit (INDEPENDENT_AMBULATORY_CARE_PROVIDER_SITE_OTHER): Payer: Self-pay | Admitting: Orthopaedic Surgery

## 2018-04-05 ENCOUNTER — Encounter (INDEPENDENT_AMBULATORY_CARE_PROVIDER_SITE_OTHER): Payer: Self-pay | Admitting: Orthopaedic Surgery

## 2018-04-05 ENCOUNTER — Ambulatory Visit (INDEPENDENT_AMBULATORY_CARE_PROVIDER_SITE_OTHER): Payer: BLUE CROSS/BLUE SHIELD | Admitting: Orthopaedic Surgery

## 2018-04-05 VITALS — BP 148/88 | HR 84 | Ht 70.0 in | Wt 240.0 lb

## 2018-04-05 DIAGNOSIS — R599 Enlarged lymph nodes, unspecified: Secondary | ICD-10-CM

## 2018-04-05 DIAGNOSIS — M4722 Other spondylosis with radiculopathy, cervical region: Secondary | ICD-10-CM | POA: Diagnosis not present
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
## 2018-04-10 ENCOUNTER — Ambulatory Visit
Admission: RE | Admit: 2018-04-10 | Discharge: 2018-04-10 | Disposition: A | Discharge status: Home / Self Care | Payer: BLUE CROSS/BLUE SHIELD | Source: Ambulatory Visit | Attending: Orthopaedic Surgery | Admitting: Orthopaedic Surgery

## 2018-04-10 DIAGNOSIS — M542 Cervicalgia: Secondary | ICD-10-CM | POA: Diagnosis not present

## 2018-04-10 DIAGNOSIS — R599 Enlarged lymph nodes, unspecified: Secondary | ICD-10-CM

## 2018-04-10 DIAGNOSIS — J929 Pleural plaque without asbestos: Secondary | ICD-10-CM | POA: Diagnosis not present

## 2018-04-10 MED ORDER — IOPAMIDOL (ISOVUE-300) INJECTION 61%
75.0000 mL | Freq: Once | INTRAVENOUS | Status: AC | PRN
  Administered 2018-04-10: 75 mL via INTRAVENOUS

## 2018-04-16 DIAGNOSIS — F840 Autistic disorder: Secondary | ICD-10-CM | POA: Diagnosis not present

## 2018-04-25 ENCOUNTER — Telehealth (INDEPENDENT_AMBULATORY_CARE_PROVIDER_SITE_OTHER): Payer: Self-pay | Admitting: Orthopaedic Surgery

## 2018-04-25 NOTE — Telephone Encounter (Signed)
Billing 10/28/16-present emailed to [facility] w/ Jolyn Lent

## 2018-04-27 ENCOUNTER — Ambulatory Visit (INDEPENDENT_AMBULATORY_CARE_PROVIDER_SITE_OTHER): Payer: BLUE CROSS/BLUE SHIELD | Admitting: Orthopaedic Surgery

## 2018-04-27 ENCOUNTER — Encounter (INDEPENDENT_AMBULATORY_CARE_PROVIDER_SITE_OTHER): Payer: Self-pay | Admitting: Orthopaedic Surgery

## 2018-04-27 VITALS — BP 141/91 | HR 76 | Ht 70.0 in | Wt 250.0 lb

## 2018-04-27 DIAGNOSIS — M542 Cervicalgia: Secondary | ICD-10-CM | POA: Diagnosis not present
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
## 2018-04-30 DIAGNOSIS — F840 Autistic disorder: Secondary | ICD-10-CM | POA: Diagnosis not present

## 2018-05-14 DIAGNOSIS — F840 Autistic disorder: Secondary | ICD-10-CM | POA: Diagnosis not present

## 2018-05-28 DIAGNOSIS — F840 Autistic disorder: Secondary | ICD-10-CM | POA: Diagnosis not present

## 2018-05-31 ENCOUNTER — Telehealth: Payer: Self-pay | Admitting: Medical

## 2018-05-31 ENCOUNTER — Other Ambulatory Visit: Payer: Self-pay | Admitting: Medical

## 2018-05-31 MED ORDER — LOSARTAN POTASSIUM 100 MG PO TABS: 100.0000 mg | ORAL_TABLET | Freq: Every day | ORAL | 1 refills | Status: DC

## 2018-05-31 MED ORDER — HYDROCHLOROTHIAZIDE 12.5 MG PO TABS: 12.5000 mg | ORAL_TABLET | Freq: Every day | ORAL | 1 refills | Status: DC

## 2018-05-31 NOTE — Telephone Encounter (Signed)
done

## 2018-05-31 NOTE — Telephone Encounter (Signed)
Called and informed pt.  

## 2018-05-31 NOTE — Telephone Encounter (Signed)
Pharmacy is requesting that we send in rx for losrtan  And hctz as a separate rx, the losartan/hctz 100-12.5 mc is on back order, please send in qty of 43  Pt uses [id number] - [place], [place] - [address]

## 2018-06-11 DIAGNOSIS — F840 Autistic disorder: Secondary | ICD-10-CM | POA: Diagnosis not present

## 2018-06-25 DIAGNOSIS — F840 Autistic disorder: Secondary | ICD-10-CM | POA: Diagnosis not present

## 2018-07-09 DIAGNOSIS — F840 Autistic disorder: Secondary | ICD-10-CM | POA: Diagnosis not present

## 2018-07-16 ENCOUNTER — Emergency Department (HOSPITAL_COMMUNITY)
Admission: EM | Admit: 2018-07-16 | Discharge: 2018-07-16 | Disposition: A | Discharge status: Home / Self Care | Payer: BLUE CROSS/BLUE SHIELD | Attending: Emergency Medicine | Admitting: Emergency Medicine

## 2018-07-16 DIAGNOSIS — Z79899 Other long term (current) drug therapy: Secondary | ICD-10-CM | POA: Insufficient documentation

## 2018-07-16 DIAGNOSIS — J302 Other seasonal allergic rhinitis: Secondary | ICD-10-CM | POA: Diagnosis not present

## 2018-07-16 DIAGNOSIS — Z96619 Presence of unspecified artificial shoulder joint: Secondary | ICD-10-CM | POA: Diagnosis not present

## 2018-07-16 DIAGNOSIS — R5383 Other fatigue: Secondary | ICD-10-CM | POA: Insufficient documentation

## 2018-07-16 DIAGNOSIS — R531 Weakness: Secondary | ICD-10-CM | POA: Diagnosis not present

## 2018-07-16 DIAGNOSIS — I10 Essential (primary) hypertension: Secondary | ICD-10-CM | POA: Diagnosis not present

## 2018-07-16 DIAGNOSIS — J3489 Other specified disorders of nose and nasal sinuses: Secondary | ICD-10-CM | POA: Diagnosis not present

## 2018-07-16 DIAGNOSIS — Z96659 Presence of unspecified artificial knee joint: Secondary | ICD-10-CM | POA: Diagnosis not present

## 2018-07-16 LAB — BASIC METABOLIC PANEL
Anion gap: 9 (ref 5–15)
BUN: 17 mg/dL (ref 8–23)
CO2: 26 mmol/L (ref 22–32)
Calcium: 8.7 mg/dL — ABNORMAL LOW (ref 8.9–10.3)
Chloride: 107 mmol/L (ref 98–111)
Creatinine, Ser: 1.04 mg/dL (ref 0.61–1.24)
GFR calc Af Amer: >60 mL/min (ref >60)
GFR calc non Af Amer: >60 mL/min (ref >60)
Glucose, Bld: 79 mg/dL (ref 70–99)
Potassium: 4.3 mmol/L (ref 3.5–5.1)
Sodium: 142 mmol/L (ref 135–145)

## 2018-07-16 LAB — CBC
HCT: 44.2 % (ref 39.0–52.0)
Hemoglobin: 14.5 g/dL (ref 13.0–17.0)
MCH: 29.8 pg (ref 26.0–34.0)
MCHC: 32.8 g/dL (ref 30.0–36.0)
MCV: 90.9 fL (ref 80.0–100.0)
Platelets: 207 10*3/uL (ref 150–400)
RBC: 4.86 MIL/uL (ref 4.22–5.81)
RDW: 14.2 % (ref 11.5–15.5)
WBC: 8.7 10*3/uL (ref 4.0–10.5)
nRBC: 0 % (ref 0.0–0.2)

## 2018-07-16 LAB — CBG MONITORING, ED: Glucose-Capillary: 100 mg/dL — ABNORMAL HIGH (ref 70–99)

## 2018-07-16 MED ORDER — HYDROCHLOROTHIAZIDE 12.5 MG PO CAPS
12.5000 mg | ORAL_CAPSULE | Freq: Every day | ORAL | Status: DC
  Administered 2018-07-16: 12.5 mg via ORAL
  Filled 2018-07-16: qty 1

## 2018-07-16 MED ORDER — BENAZEPRIL-HYDROCHLOROTHIAZIDE 20-12.5 MG PO TABS: 1.0000 | ORAL_TABLET | Freq: Every day | ORAL | 0 refills | Status: DC

## 2018-07-16 MED ORDER — SODIUM CHLORIDE 0.9% FLUSH: 3.0000 mL | Freq: Once | INTRAVENOUS | Status: DC

## 2018-07-16 NOTE — Discharge Instructions (Signed)
We signed the ER for your blood pressure concerns.  The blood pressure is not dangerously high in the ER.  We are adding additional hydrochlorothiazide to your existing dose.  We recommend that you follow-up with your primary care doctor soon as possible for further management of your elevated blood pressure.
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
## 2018-07-16 NOTE — ED Triage Notes (Signed)
Told to come to the ED by PMD for eval of fatigue and HTN over the last month. Pt states he at times has a scratchy throat. Denies CP but does complain of some sob 'because of pollen'. Difficult to get a good story from pt.

## 2018-07-17 ENCOUNTER — Telehealth: Payer: Self-pay | Admitting: Medical

## 2018-07-17 NOTE — Telephone Encounter (Signed)
Schedule f/u in 3 weeks regarding BP.  He went to the ED yesterday for BP, told ED that we/PCP office told him to go to the ED .  I don't see any documentation that he called here.   Nevertheless, schedule 3 wk f/u

## 2018-07-18 ENCOUNTER — Telehealth: Payer: Self-pay

## 2018-07-18 NOTE — Telephone Encounter (Signed)
Called patient and he states that he does not want to do virtual visit and so he will just suffer.   I told him that we could not have him come in to the office with these symptoms and he states that he knows that and he will be fine.

## 2018-07-18 NOTE — Telephone Encounter (Signed)
Pt was called and message was left.

## 2018-07-18 NOTE — Telephone Encounter (Signed)
I sent him an e-mail correspondence.   Lets see how he responds.

## 2018-07-24 DIAGNOSIS — F840 Autistic disorder: Secondary | ICD-10-CM | POA: Diagnosis not present

## 2018-08-01 ENCOUNTER — Ambulatory Visit (HOSPITAL_COMMUNITY)
Admission: EM | Admit: 2018-08-01 | Discharge: 2018-08-01 | Disposition: A | Discharge status: Home / Self Care | Payer: BC Managed Care – PPO | Attending: Family Medicine | Admitting: Family Medicine

## 2018-08-01 ENCOUNTER — Other Ambulatory Visit: Payer: Self-pay

## 2018-08-01 ENCOUNTER — Encounter (HOSPITAL_COMMUNITY): Payer: Self-pay

## 2018-08-01 ENCOUNTER — Ambulatory Visit (INDEPENDENT_AMBULATORY_CARE_PROVIDER_SITE_OTHER): Payer: BC Managed Care – PPO

## 2018-08-01 DIAGNOSIS — J209 Acute bronchitis, unspecified: Secondary | ICD-10-CM

## 2018-08-01 DIAGNOSIS — R0602 Shortness of breath: Secondary | ICD-10-CM | POA: Diagnosis not present

## 2018-08-01 DIAGNOSIS — R05 Cough: Secondary | ICD-10-CM | POA: Diagnosis not present

## 2018-08-01 MED ORDER — BENZONATATE 100 MG PO CAPS: 100.0000 mg | ORAL_CAPSULE | Freq: Three times a day (TID) | ORAL | 0 refills | Status: DC

## 2018-08-01 MED ORDER — PREDNISONE 50 MG PO TABS: 50.0000 mg | ORAL_TABLET | Freq: Every day | ORAL | 0 refills | Status: AC

## 2018-08-01 MED ORDER — DOXYCYCLINE HYCLATE 100 MG PO CAPS: 100.0000 mg | ORAL_CAPSULE | Freq: Two times a day (BID) | ORAL | 0 refills | Status: AC

## 2018-08-01 MED ORDER — CETIRIZINE HCL 10 MG PO CAPS: 10.0000 mg | ORAL_CAPSULE | Freq: Every day | ORAL | 0 refills | Status: DC

## 2018-08-01 MED ORDER — ALBUTEROL SULFATE HFA 108 (90 BASE) MCG/ACT IN AERS: 1.0000 | INHALATION_SPRAY | Freq: Four times a day (QID) | RESPIRATORY_TRACT | 0 refills | Status: DC | PRN
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
## 2018-08-01 NOTE — ED Triage Notes (Signed)
Patient presents to Urgent Care with complaints of cough and sore throat since over a month ago. Patient reports he has been trying otc meds, has not been able to get into see his PCP because of COVID, denies fevers.

## 2018-08-01 NOTE — Discharge Instructions (Signed)
No pneumonia on chest x-ray Your symptoms are suggestive of bronchitis Please begin doxycycline twice daily for the next 10 days Prednisone daily for 5 days with food to help with inflammation and congestion and chest Albuterol inhaler 1 to 2 puffs every 4-6 hours for the first 24 to 48 hours then go to as needed for shortness of breath and wheezing Tessalon as needed for cough every 8 hours Daily cetirizine to further help with congestion and drainage irritating and causing sore throat  Please follow-up if symptoms worsening, developing increased shortness of breath, difficulty breathing, worsening cough or fevers

## 2018-08-06 ENCOUNTER — Inpatient Hospital Stay: Payer: BLUE CROSS/BLUE SHIELD | Admitting: Medical

## 2018-08-06 ENCOUNTER — Telehealth: Payer: Self-pay | Admitting: Medical

## 2018-08-06 DIAGNOSIS — F840 Autistic disorder: Secondary | ICD-10-CM | POA: Diagnosis not present

## 2018-08-06 NOTE — Progress Notes (Deleted)
Subjective: No chief complaint on file.   ***  Here for emergency department follow-up  I reviewed his August 01, 2018 emergency department notes from where he was seen for bronchitis.  He was started on doxycycline and prednisone, given inhaler of albuterol, Tessalon Perles, given cetirizine to help with congestion and drainage.  He tested negative for coronavirus which was reportedly negative on Jul 23, 2018  He was also seen in emergency department on Jul 16, 2018 for fatigue and elevated blood pressure.  There was a comment that he call our office prior to this visit was advised to go to the emergency department but I do not see any evidence of this phone call in the chart and I do not recall him calling him. At that time they added 12.5 mg of hydrochlorothiazide to his already existing benazepril HCT 20/12.5 mg  Hyperlipidemia-compliant with rosuvastatin 10 mg daily  History of sleep apnea-***  History of GERD, history of GI bleed, history of gastrointestinal stromal tumor- ***  Vitamin D deficiency-***  Sees Dr. Durward Fortes orthopedics for joint issues, arthritis  Past Medical History:  Diagnosis Date  . Anemia 2009   noted during hospitalization for surgical I&D of chest, arm abscess  . Anxiety   . Appendicitis   . Arthritis   . Depression   . Dyspnea    with anxiety and some exertion  . Gastric tumor 2013   resection  . GERD (gastroesophageal reflux disease)   . GI bleed 2013  . H/O seasonal allergies   . High cholesterol   . History of hiatal hernia   . Hypertension   . IBS (irritable bowel syndrome)   . Pneumonia   . Sleep apnea    has a cpap machine but seldom uses it.     Current Outpatient Medications on File Prior to Visit  Medication Sig Dispense Refill  . acetaminophen (TYLENOL) 500 MG tablet Take 1,000 mg by mouth daily as needed for headache (pain). Reported on 08/05/2015    . albuterol (VENTOLIN HFA) 108 (90 Base) MCG/ACT inhaler Inhale 1-2 puffs into the  lungs every 6 (six) hours as needed for wheezing or shortness of breath. 1 Inhaler 0  . Ascorbic Acid (VITAMIN C) 1000 MG tablet Take 1,000 mg by mouth daily.    . benazepril-hydrochlorthiazide (LOTENSIN HCT) 20-12.5 MG tablet Take 1 tablet by mouth daily. 10 tablet 0  . benzonatate (TESSALON) 100 MG capsule Take 1 capsule (100 mg total) by mouth every 8 (eight) hours. 24 capsule 0  . Cetirizine HCl 10 MG CAPS Take 1 capsule (10 mg total) by mouth daily. 20 capsule 0  . doxycycline (VIBRAMYCIN) 100 MG capsule Take 1 capsule (100 mg total) by mouth 2 (two) times daily for 10 days. 20 capsule 0  . losartan-hydrochlorothiazide (HYZAAR) 100-12.5 MG tablet Take 1 tablet by mouth daily.    . Multiple Vitamin (MULITIVITAMIN WITH MINERALS) TABS Take 1 tablet by mouth daily.    Marland Kitchen omeprazole (PRILOSEC) 40 MG capsule Take 1 capsule (40 mg total) by mouth daily. (Patient taking differently: Take 40 mg by mouth daily as needed (acid reflux/ heartburn). ) 30 capsule 3  . predniSONE (DELTASONE) 50 MG tablet Take 1 tablet (50 mg total) by mouth daily for 5 days. 5 tablet 0  . rosuvastatin (CRESTOR) 10 MG tablet TAKE 1 TABLET(10 MG) BY MOUTH AT BEDTIME (Patient taking differently: Take 10 mg by mouth at bedtime. ) 90 tablet 1   No current facility-administered medications on file  prior to visit.    ROS as in subjective  Objective: There were no vitals taken for this visit.  Gen: ***     Assessment: No diagnosis found.   Plan: ***  Reviewing his chart for overview and to clarify the problem list, I reviewed several prior records and noted information below  Gastroenterology I reviewed his last gastroenterology notes from June 2017  GERD- he continues on omeprazole 40 mg daily, last EGD 2014 History of ventral hernia, minimally symptomatic as of 2017 History of right inguinal hernia, minimally symptomatic as of 2017 History of GIST tumor, status post resection 2013 History of GI bleed  2013 Normal colonoscopy 2014, plan to send home with stool cards today Per Dr. Scarlette Shorts, can use dicyclomine 20 mg every 6 hours as needed for abdominal discomfort, he recommended Metamucil 2 tablespoons daily   Hypertension-continue***  Hyperlipidemia-labs at goal September 2019, continue Crestor 10 mg daily  History of mild sleep apnea-*** CPAP?  Vitamin D deficiency-advise he restart daily supplement, [phone] units daily over-the-counter, eat fish regularly at least twice a week,  History of PSA elevation likely due to prostatitis back in 2016, normal March 2018.  Repeat PSA today ***

## 2018-08-06 NOTE — Telephone Encounter (Signed)
Pt called and was suppose to be a virtual office visit pt answered yes to cough, weakness, SOB, because of that we where not able to see him in the office, we offered him a virtual office visit and to go back the urgent care where he was seen and he denied the office visit.

## 2018-08-17 ENCOUNTER — Other Ambulatory Visit: Payer: Self-pay

## 2018-08-17 ENCOUNTER — Encounter (HOSPITAL_COMMUNITY): Payer: Self-pay | Admitting: Emergency Medicine

## 2018-08-17 ENCOUNTER — Ambulatory Visit (HOSPITAL_COMMUNITY)
Admission: EM | Admit: 2018-08-17 | Discharge: 2018-08-17 | Disposition: A | Discharge status: Home / Self Care | Payer: BC Managed Care – PPO | Attending: Internal Medicine | Admitting: Internal Medicine

## 2018-08-17 DIAGNOSIS — R05 Cough: Secondary | ICD-10-CM

## 2018-08-17 DIAGNOSIS — R059 Cough, unspecified: Secondary | ICD-10-CM

## 2018-08-17 MED ORDER — METHYLPREDNISOLONE ACETATE 80 MG/ML IJ SUSP
INTRAMUSCULAR | Status: AC
  Filled 2018-08-17: qty 1

## 2018-08-17 MED ORDER — METHYLPREDNISOLONE ACETATE 80 MG/ML IJ SUSP
80.0000 mg | Freq: Once | INTRAMUSCULAR | Status: AC
  Administered 2018-08-17: 80 mg via INTRAMUSCULAR

## 2018-08-17 MED ORDER — CETIRIZINE HCL 10 MG PO TABS: 10.0000 mg | ORAL_TABLET | Freq: Every day | ORAL | 0 refills | Status: DC

## 2018-08-17 NOTE — Discharge Instructions (Signed)
Please ensure you are taking daily zyrtec as well as daily flonase.  Use of inhaler as needed.  Your exam is overall reassuring here today.  If this persists please continue to follow up with your primary care provider.  If increased pain, shortness of breath , dizziness, or otherwise worsening please go to the ER.

## 2018-08-17 NOTE — ED Triage Notes (Signed)
Pt reports continued problems with a cough w/ pain in the chest and back when coughing.  He was seen and treated here two weeks ago.  

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
## 2018-09-03 DIAGNOSIS — F840 Autistic disorder: Secondary | ICD-10-CM | POA: Diagnosis not present

## 2018-09-17 DIAGNOSIS — F840 Autistic disorder: Secondary | ICD-10-CM | POA: Diagnosis not present

## 2018-09-19 DIAGNOSIS — F840 Autistic disorder: Secondary | ICD-10-CM | POA: Diagnosis not present

## 2018-10-01 DIAGNOSIS — J309 Allergic rhinitis, unspecified: Secondary | ICD-10-CM | POA: Diagnosis not present

## 2018-10-01 DIAGNOSIS — R05 Cough: Secondary | ICD-10-CM | POA: Diagnosis not present

## 2018-10-01 DIAGNOSIS — Z1331 Encounter for screening for depression: Secondary | ICD-10-CM | POA: Diagnosis not present

## 2018-10-02 DIAGNOSIS — F840 Autistic disorder: Secondary | ICD-10-CM | POA: Diagnosis not present

## 2018-10-09 DIAGNOSIS — Z20828 Contact with and (suspected) exposure to other viral communicable diseases: Secondary | ICD-10-CM | POA: Diagnosis not present

## 2018-10-16 DIAGNOSIS — F840 Autistic disorder: Secondary | ICD-10-CM | POA: Diagnosis not present

## 2018-10-27 DIAGNOSIS — R109 Unspecified abdominal pain: Secondary | ICD-10-CM | POA: Diagnosis not present

## 2018-10-27 DIAGNOSIS — K219 Gastro-esophageal reflux disease without esophagitis: Secondary | ICD-10-CM | POA: Diagnosis not present

## 2018-10-29 DIAGNOSIS — F840 Autistic disorder: Secondary | ICD-10-CM | POA: Diagnosis not present

## 2018-11-12 DIAGNOSIS — F840 Autistic disorder: Secondary | ICD-10-CM | POA: Diagnosis not present

## 2018-11-15 DIAGNOSIS — Z903 Acquired absence of stomach [part of]: Secondary | ICD-10-CM | POA: Diagnosis not present

## 2018-11-15 DIAGNOSIS — Z8509 Personal history of malignant neoplasm of other digestive organs: Secondary | ICD-10-CM | POA: Diagnosis not present

## 2018-11-15 DIAGNOSIS — R1013 Epigastric pain: Secondary | ICD-10-CM | POA: Diagnosis not present

## 2018-11-15 DIAGNOSIS — K219 Gastro-esophageal reflux disease without esophagitis: Secondary | ICD-10-CM | POA: Diagnosis not present

## 2018-11-21 DIAGNOSIS — K5652 Intestinal adhesions [bands] with complete obstruction: Secondary | ICD-10-CM | POA: Diagnosis not present

## 2018-11-21 DIAGNOSIS — K565 Intestinal adhesions [bands], unspecified as to partial versus complete obstruction: Secondary | ICD-10-CM | POA: Diagnosis not present

## 2018-11-21 DIAGNOSIS — R1084 Generalized abdominal pain: Secondary | ICD-10-CM | POA: Diagnosis not present

## 2018-11-21 DIAGNOSIS — R52 Pain, unspecified: Secondary | ICD-10-CM | POA: Diagnosis not present

## 2018-11-21 DIAGNOSIS — K219 Gastro-esophageal reflux disease without esophagitis: Secondary | ICD-10-CM | POA: Diagnosis not present

## 2018-11-21 DIAGNOSIS — N281 Cyst of kidney, acquired: Secondary | ICD-10-CM | POA: Diagnosis not present

## 2018-11-21 DIAGNOSIS — K56699 Other intestinal obstruction unspecified as to partial versus complete obstruction: Secondary | ICD-10-CM | POA: Diagnosis not present

## 2018-11-21 DIAGNOSIS — K56609 Unspecified intestinal obstruction, unspecified as to partial versus complete obstruction: Secondary | ICD-10-CM | POA: Diagnosis not present

## 2018-11-21 DIAGNOSIS — R001 Bradycardia, unspecified: Secondary | ICD-10-CM | POA: Diagnosis not present

## 2018-11-21 DIAGNOSIS — E875 Hyperkalemia: Secondary | ICD-10-CM | POA: Diagnosis not present

## 2018-11-21 DIAGNOSIS — R112 Nausea with vomiting, unspecified: Secondary | ICD-10-CM | POA: Diagnosis not present

## 2018-11-21 DIAGNOSIS — R1033 Periumbilical pain: Secondary | ICD-10-CM | POA: Diagnosis not present

## 2018-11-21 DIAGNOSIS — R109 Unspecified abdominal pain: Secondary | ICD-10-CM | POA: Diagnosis not present

## 2018-11-21 DIAGNOSIS — K56601 Complete intestinal obstruction, unspecified as to cause: Secondary | ICD-10-CM | POA: Diagnosis not present

## 2018-11-21 DIAGNOSIS — I10 Essential (primary) hypertension: Secondary | ICD-10-CM | POA: Diagnosis not present

## 2018-11-21 DIAGNOSIS — G8918 Other acute postprocedural pain: Secondary | ICD-10-CM | POA: Diagnosis not present

## 2018-11-21 DIAGNOSIS — K5651 Intestinal adhesions [bands], with partial obstruction: Secondary | ICD-10-CM | POA: Diagnosis not present

## 2018-11-21 DIAGNOSIS — Z20828 Contact with and (suspected) exposure to other viral communicable diseases: Secondary | ICD-10-CM | POA: Diagnosis not present

## 2018-11-21 DIAGNOSIS — E785 Hyperlipidemia, unspecified: Secondary | ICD-10-CM | POA: Diagnosis not present

## 2018-11-21 DIAGNOSIS — R140 Abdominal distension (gaseous): Secondary | ICD-10-CM | POA: Diagnosis not present

## 2018-12-04 DIAGNOSIS — J309 Allergic rhinitis, unspecified: Secondary | ICD-10-CM | POA: Diagnosis not present

## 2018-12-04 DIAGNOSIS — I10 Essential (primary) hypertension: Secondary | ICD-10-CM | POA: Diagnosis not present

## 2018-12-25 DIAGNOSIS — K660 Peritoneal adhesions (postprocedural) (postinfection): Secondary | ICD-10-CM | POA: Diagnosis not present

## 2018-12-25 DIAGNOSIS — K913 Postprocedural intestinal obstruction, unspecified as to partial versus complete: Secondary | ICD-10-CM | POA: Diagnosis not present

## 2018-12-25 DIAGNOSIS — R300 Dysuria: Secondary | ICD-10-CM | POA: Diagnosis not present

## 2018-12-25 DIAGNOSIS — R109 Unspecified abdominal pain: Secondary | ICD-10-CM | POA: Diagnosis not present

## 2018-12-25 DIAGNOSIS — K5903 Drug induced constipation: Secondary | ICD-10-CM | POA: Diagnosis not present

## 2018-12-25 DIAGNOSIS — R1084 Generalized abdominal pain: Secondary | ICD-10-CM | POA: Diagnosis not present

## 2019-01-03 DIAGNOSIS — R400 Somnolence: Secondary | ICD-10-CM | POA: Diagnosis not present

## 2019-01-03 DIAGNOSIS — R42 Dizziness and giddiness: Secondary | ICD-10-CM | POA: Diagnosis not present

## 2019-01-03 DIAGNOSIS — R55 Syncope and collapse: Secondary | ICD-10-CM | POA: Diagnosis not present

## 2019-01-04 DIAGNOSIS — R55 Syncope and collapse: Secondary | ICD-10-CM | POA: Diagnosis not present

## 2019-01-07 DIAGNOSIS — F840 Autistic disorder: Secondary | ICD-10-CM | POA: Diagnosis not present

## 2019-01-09 DIAGNOSIS — K660 Peritoneal adhesions (postprocedural) (postinfection): Secondary | ICD-10-CM | POA: Diagnosis not present

## 2019-01-09 DIAGNOSIS — R42 Dizziness and giddiness: Secondary | ICD-10-CM | POA: Diagnosis not present

## 2019-01-16 DIAGNOSIS — J324 Chronic pansinusitis: Secondary | ICD-10-CM | POA: Diagnosis not present

## 2019-01-21 DIAGNOSIS — F840 Autistic disorder: Secondary | ICD-10-CM | POA: Diagnosis not present

## 2019-02-04 DIAGNOSIS — F840 Autistic disorder: Secondary | ICD-10-CM | POA: Diagnosis not present

## 2019-02-06 DIAGNOSIS — J3089 Other allergic rhinitis: Secondary | ICD-10-CM | POA: Diagnosis not present

## 2019-02-06 DIAGNOSIS — J342 Deviated nasal septum: Secondary | ICD-10-CM | POA: Diagnosis not present

## 2019-02-06 DIAGNOSIS — J324 Chronic pansinusitis: Secondary | ICD-10-CM | POA: Diagnosis not present

## 2019-02-06 DIAGNOSIS — J329 Chronic sinusitis, unspecified: Secondary | ICD-10-CM | POA: Diagnosis not present

## 2019-02-12 DIAGNOSIS — F488 Other specified nonpsychotic mental disorders: Secondary | ICD-10-CM | POA: Diagnosis not present

## 2019-02-12 DIAGNOSIS — J988 Other specified respiratory disorders: Secondary | ICD-10-CM | POA: Diagnosis not present

## 2019-02-12 DIAGNOSIS — J324 Chronic pansinusitis: Secondary | ICD-10-CM | POA: Diagnosis not present

## 2019-02-14 DIAGNOSIS — E785 Hyperlipidemia, unspecified: Secondary | ICD-10-CM | POA: Diagnosis not present

## 2019-02-14 DIAGNOSIS — H9313 Tinnitus, bilateral: Secondary | ICD-10-CM | POA: Diagnosis not present

## 2019-02-14 DIAGNOSIS — R42 Dizziness and giddiness: Secondary | ICD-10-CM | POA: Diagnosis not present

## 2019-02-14 DIAGNOSIS — R4789 Other speech disturbances: Secondary | ICD-10-CM | POA: Diagnosis not present

## 2019-08-07 ENCOUNTER — Telehealth: Payer: Self-pay | Admitting: Medical

## 2019-08-07 NOTE — Telephone Encounter (Signed)
I was calling to check in on him and see how he is doing.  Get in for fasting physical

## 2019-08-07 NOTE — Telephone Encounter (Signed)
Looks like pt has wake [employer]

## 2021-10-20 ENCOUNTER — Other Ambulatory Visit: Payer: Self-pay

## 2021-10-20 ENCOUNTER — Emergency Department (HOSPITAL_BASED_OUTPATIENT_CLINIC_OR_DEPARTMENT_OTHER): Payer: Medicare HMO | Admitting: Radiology

## 2021-10-20 ENCOUNTER — Encounter (HOSPITAL_BASED_OUTPATIENT_CLINIC_OR_DEPARTMENT_OTHER): Payer: Self-pay

## 2021-10-20 ENCOUNTER — Emergency Department (HOSPITAL_BASED_OUTPATIENT_CLINIC_OR_DEPARTMENT_OTHER)
Admission: EM | Admit: 2021-10-20 | Discharge: 2021-10-20 | Disposition: A | Discharge status: Home / Self Care | Payer: Medicare HMO | Attending: Emergency Medicine | Admitting: Emergency Medicine

## 2021-10-20 DIAGNOSIS — I10 Essential (primary) hypertension: Secondary | ICD-10-CM | POA: Insufficient documentation

## 2021-10-20 DIAGNOSIS — R110 Nausea: Secondary | ICD-10-CM | POA: Insufficient documentation

## 2021-10-20 DIAGNOSIS — R0602 Shortness of breath: Secondary | ICD-10-CM | POA: Insufficient documentation

## 2021-10-20 DIAGNOSIS — R5383 Other fatigue: Secondary | ICD-10-CM | POA: Insufficient documentation

## 2021-10-20 DIAGNOSIS — B349 Viral infection, unspecified: Secondary | ICD-10-CM | POA: Diagnosis not present

## 2021-10-20 LAB — BASIC METABOLIC PANEL
Anion gap: 10 (ref 5–15)
BUN: 16 mg/dL (ref 8–23)
CO2: 23 mmol/L (ref 22–32)
Calcium: 8.9 mg/dL (ref 8.9–10.3)
Chloride: 106 mmol/L (ref 98–111)
Creatinine, Ser: 1.09 mg/dL (ref 0.61–1.24)
GFR, Estimated: >60 mL/min (ref >60)
Glucose, Bld: 85 mg/dL (ref 70–99)
Potassium: 3.8 mmol/L (ref 3.5–5.1)
Sodium: 139 mmol/L (ref 135–145)

## 2021-10-20 LAB — CBC
HCT: 45.9 % (ref 39.0–52.0)
Hemoglobin: 15.5 g/dL (ref 13.0–17.0)
MCH: 30.1 pg (ref 26.0–34.0)
MCHC: 33.8 g/dL (ref 30.0–36.0)
MCV: 89.1 fL (ref 80.0–100.0)
Platelets: 198 10*3/uL (ref 150–400)
RBC: 5.15 MIL/uL (ref 4.22–5.81)
RDW: 14 % (ref 11.5–15.5)
WBC: 9.4 10*3/uL (ref 4.0–10.5)
nRBC: 0 % (ref 0.0–0.2)

## 2021-10-20 LAB — URINALYSIS, ROUTINE W REFLEX MICROSCOPIC
Bilirubin Urine: NEGATIVE
Glucose, UA: NEGATIVE mg/dL
Hgb urine dipstick: NEGATIVE
Ketones, ur: NEGATIVE mg/dL
Leukocytes,Ua: NEGATIVE
Nitrite: NEGATIVE
Protein, ur: NEGATIVE mg/dL
Specific Gravity, Urine: 1.018 (ref 1.005–1.030)
pH: 8 (ref 5.0–8.0)

## 2021-10-20 LAB — TROPONIN I (HIGH SENSITIVITY): Troponin I (High Sensitivity): 4 ng/L (ref <18)

## 2021-10-20 NOTE — ED Provider Notes (Signed)
[place] EMERGENCY DEPT Provider Note   CSN: [id number] Arrival date & time: 10/20/21  1205     History  Chief Complaint  Patient presents with   Hypertension    DREWEY BEGUE is a 66 y.o. male.   Hypertension  Patient is a 66 year old male with past medical history significant for   Patient is present emergency room today with complaints of elevated blood pressures over the past few days.  He states he has been as high as 329J systolic.  He states this is unusual for him.  He states that he has been congested and had a sore throat and a cough that has been mostly dry over the past 2 weeks.  He states he has felt somewhat nauseous at times over this course of time as well.  Denies any chest pain or difficulty breathing.  Seems that he informed triage nurse that he was having some shortness of breath but to me that he explains that this is because he is very congested in his nose.  No hemoptysis leg swelling or pain in his legs. No exertional symptoms.  He states that at times he feels lightheaded but has not been near syncopal.     Home Medications Prior to Admission medications   Medication Sig Start Date End Date Taking? Authorizing Provider  acetaminophen (TYLENOL) 500 MG tablet Take 1,000 mg by mouth daily as needed for headache (pain). Reported on 08/05/2015    [provider]  albuterol (VENTOLIN HFA) 108 (90 Base) MCG/ACT inhaler Inhale 1-2 puffs into the lungs every 6 (six) hours as needed for wheezing or shortness of breath. 08/01/18   Wieters, Hallie C, PA-C  Ascorbic Acid (VITAMIN C) 1000 MG tablet Take 1,000 mg by mouth daily.    [provider]  benazepril-hydrochlorthiazide (LOTENSIN HCT) 20-12.5 MG tablet Take 1 tablet by mouth daily. 07/16/18   Varney Biles, MD  benzonatate (TESSALON) 100 MG capsule Take 1 capsule (100 mg total) by mouth every 8 (eight) hours. 08/01/18   Wieters, Hallie C, PA-C  cetirizine (ZYRTEC) 10 MG tablet  Take 1 tablet (10 mg total) by mouth daily. 08/17/18   Zigmund Gottron, NP  losartan-hydrochlorothiazide (HYZAAR) 100-12.5 MG tablet Take 1 tablet by mouth daily. 06/01/18   [provider]  Multiple Vitamin (MULITIVITAMIN WITH MINERALS) TABS Take 1 tablet by mouth daily.    [provider]  omeprazole (PRILOSEC) 40 MG capsule Take 1 capsule (40 mg total) by mouth daily. Patient taking differently: Take 40 mg by mouth daily as needed (acid reflux/ heartburn).  01/12/15   Irene Shipper, MD  rosuvastatin (CRESTOR) 10 MG tablet TAKE 1 TABLET(10 MG) BY MOUTH AT BEDTIME Patient taking differently: Take 10 mg by mouth at bedtime.  11/09/17   Tysinger, Camelia Eng, PA-C      Allergies    Patient has no known allergies.    Review of Systems   Review of Systems  Physical Exam Updated Vital Signs BP 106/82   Pulse 72   Temp 98 F (36.7 C)   Resp 19   Ht '5\' 10"'$  (1.778 m)   Wt 113.4 kg   SpO2 98%   BMI 35.87 kg/m  Physical Exam Vitals and nursing note reviewed.  Constitutional:      General: He is not in acute distress. HENT:     Head: Normocephalic and atraumatic.     Nose: Congestion present.     Comments: Congestion     Mouth/Throat:  Mouth: Mucous membranes are moist.  Eyes:     General: No scleral icterus. Cardiovascular:     Rate and Rhythm: Normal rate and regular rhythm.     Pulses: Normal pulses.     Heart sounds: Normal heart sounds.  Pulmonary:     Effort: Pulmonary effort is normal. No respiratory distress.     Breath sounds: No wheezing.  Abdominal:     Palpations: Abdomen is soft.     Tenderness: There is no abdominal tenderness. There is no guarding or rebound.  Musculoskeletal:     Cervical back: Normal range of motion.     Right lower leg: No edema.     Left lower leg: No edema.  Skin:    General: Skin is warm and dry.     Capillary Refill: Capillary refill takes less than 2 seconds.  Neurological:     Mental Status: He is alert. Mental  status is at baseline.     Comments: Alert and oriented to self, place, time and event.   Speech is fluent, clear without dysarthria or dysphasia.   Strength 5/5 in upper/lower extremities   Sensation intact in upper/lower extremities   Normal gait.  CN I not tested  CN II grossly intact visual fields bilaterally. Did not visualize posterior eye.  CN III, IV, VI PERRLA and EOMs intact bilaterally  CN V Intact sensation to sharp and light touch to the face  CN VII facial movements symmetric  CN VIII not tested  CN IX, X no uvula deviation, symmetric rise of soft palate  CN XI 5/5 SCM and trapezius strength bilaterally  CN XII Midline tongue protrusion, symmetric L/R movements   Psychiatric:        Mood and Affect: Mood normal.        Behavior: Behavior normal.     ED Results / Procedures / Treatments   Labs (all labs ordered are listed, but only abnormal results are displayed) Labs Reviewed  BASIC METABOLIC PANEL  CBC  URINALYSIS, ROUTINE W REFLEX MICROSCOPIC  TROPONIN I (HIGH SENSITIVITY)    EKG None  Radiology DG Chest 2 View  Result Date: 10/20/2021 CLINICAL DATA:  Shortness of breath EXAM: CHEST - 2 VIEW COMPARISON:  08/01/2018 FINDINGS: The heart size and mediastinal contours are within normal limits. No focal airspace consolidation, pleural effusion, or pneumothorax. Changes of diffuse idiopathic skeletal hyperostosis. IMPRESSION: No active cardiopulmonary disease. Electronically Signed   By: Davina Poke D.O.   On: 10/20/2021 12:37    Procedures Procedures    Medications Ordered in ED Medications - No data to display  ED Course/ Medical Decision Making/ A&P                           Medical Decision Making Amount and/or Complexity of Data Reviewed Labs: ordered. Radiology: ordered.   This patient presents to the ED for concern of fatigue, this involves a number of treatment options, and is a complaint that carries with it a moderate risk of  complications and morbidity.  The differential diagnosis includes The differential diagnosis of weakness includes but is not limited to neurologic causes (GBS, myasthenia gravis, CVA, MS, ALS, transverse myelitis, spinal cord injury, CVA, botulism, ) and other causes: ACS, Arrhythmia, syncope, orthostatic hypotension, sepsis, hypoglycemia, electrolyte disturbance, hypothyroidism, respiratory failure, symptomatic anemia, dehydration, heat injury, polypharmacy, malignancy.    Co morbidities: Discussed in HPI   Brief History:  Patient is a 66 year old male with  past medical history significant for   Patient is present emergency room today with complaints of elevated blood pressures over the past few days.  He states he has been as high as 287O systolic.  He states this is unusual for him.  He states that he has been congested and had a sore throat and a cough that has been mostly dry over the past 2 weeks.  He states he has felt somewhat nauseous at times over this course of time as well.  Denies any chest pain or difficulty breathing.  Seems that he informed triage nurse that he was having some shortness of breath but to me that he explains that this is because he is very congested in his nose.  No hemoptysis leg swelling or pain in his legs. No exertional symptoms.  He states that at times he feels lightheaded but has not been near syncopal.    EMR reviewed including pt PMHx, past surgical history and past visits to ER.   See HPI for more details   Lab Tests:    I personally reviewed all laboratory work and imaging.  Metabolic panel without any acute abnormality specifically kidney function within normal limits and no significant electrolyte abnormalities. CBC without leukocytosis or significant anemia.  Troponin within normal limits at 4.  No chest pain.  I reviewed prior notes of patient's and TSH from within 2 months ago.  Was within normal limits.  Imaging Studies:  NAD. I  personally reviewed all imaging studies and no acute abnormality found. I agree with radiology interpretation.  Chest x-ray unremarkable  Cardiac Monitoring:  The patient was maintained on a cardiac monitor.  I personally viewed and interpreted the cardiac monitored which showed an underlying rhythm of: EKG non-ischemic significant change from prior EKGs--some small Q waves in inferior leads present on prior EKGs   Medicines ordered:  Offered Tylenol which she declines   Critical Interventions:     Consults/Attending Physician      Reevaluation:  After the interventions noted above I re-evaluated patient and found that they have :stayed the same   Social Determinants of Health:      Problem List / ED Course:  Fatigue.  He states he is still able to do his normal activities of daily living.  Endorses congestion and cough and sore throat.  Cough is dry chest x-ray without pneumonia.  Recommend close follow-up with PCP and return precautions but conservative therapy for likely viral illness on top of seasonal allergies that he generally suffers from.   Dispostion:  After consideration of the diagnostic results and the patients response to treatment, I feel that the patent would benefit from close outpatient follow-up.  Final Clinical Impression(s) / ED Diagnoses Final diagnoses:  Other fatigue  Viral illness    Rx / DC Orders ED Discharge Orders     None         Tedd Sias, [place] 10/20/21 1751    Malvin Johns, MD 10/21/21 [phone]

## 2021-10-20 NOTE — Discharge Instructions (Signed)
Please use the fluticasone that you are using twice daily 2 sprays into each nostril.  Make sure drinking plenty of water.  Please follow-up with your primary care provider.  May always return emergency room for any new or concerning symptoms.  I do also recommend taking 1000 mg Tylenol as needed for any body aches.  Your urine, chest x-ray, labs and EKG are all without any remarkable findings.

## 2021-10-20 NOTE — ED Triage Notes (Signed)
Patient here POV from Home.  Endorses Patient BP has been High More recently over the Past Few Days. States Yesterday he was at Work when he became Lightheaded and had  a Near Syncopal Episode. Endorses Some Nausea.   History of Treated HTN. No Emesis. BP has roughly been high at 962 Systolic. Some SOB and Dizziness Still.   NAD Noted during Triage. A&Ox4. GCS 15. BIB Wheelchair.

## 2022-06-15 NOTE — Progress Notes (Signed)
GU Location of Tumor / Histology: Prostate Ca  If Prostate Cancer, Gleason Score is (3 + 4) and PSA is (4.52 on 04/07/2022)  Biopsies      Past/Anticipated interventions by urology, if any:    Past/Anticipated interventions by medical oncology, if any:  NA  Weight changes, if any: No  IPSS:  3 SHIM:  12  Bowel/Bladder complaints, if any:  No  Nausea/Vomiting, if any:  Nausea no vomiting.  Pain issues, if any:  5/10 lower back and right knee.  SAFETY ISSUES: Prior radiation?  No Pacemaker/ICD?  No Possible current pregnancy? Male Is the patient on methotrexate? No  Current Complaints / other details:

## 2022-06-19 NOTE — Progress Notes (Signed)
Radiation Oncology         [phone] ________________________________  Initial Outpatient Consultation  Name: John Keller MRN: [id number]  Date: 06/20/2022  DOB: 09/27/1955  OZ:DGUYQI, Shawn P, DO  Pace, Weyman Croon, MD   REFERRING PHYSICIAN: Noel Christmas, MD  DIAGNOSIS: 68 y.o. gentleman with Stage T1c adenocarcinoma of the prostate with Gleason score of 3+4, and PSA of 4.5.  No diagnosis found.  HISTORY OF PRESENT ILLNESS: John Keller is a 67 y.o. male with a diagnosis of prostate cancer. He had previously been seen by [facility] in 2020 for an elevated PSA. At that time, he was treated for prostatitis, and his PSA subsequently dropped. More recently, he was noted to have an elevated PSA of 4.74 by his primary care physician, Dr. Vivi Ferns.  Accordingly, he was referred for evaluation in urology by Dr. Arita Miss on 03/02/22,  digital rectal examination performed at that time showed no nodules. He was again treated for infection. He returned for repeat PSA and follow up on 04/06/22, and his PSA remained elevated at 4.52. The patient proceeded to transrectal ultrasound with 12 biopsies of the prostate on 05/18/22.  The prostate volume measured 66.24 cc.  Out of 12 core biopsies, 5 were positive.  The maximum Gleason score was 3+4, and this was seen in right base lateral. Additionally, Gleason 3+3 was seen in right apex lateral, right base, left apex lateral, and left base lateral.  The patient reviewed the biopsy results with his urologist and he has kindly been referred today for discussion of potential radiation treatment options.   PREVIOUS RADIATION THERAPY: No  PAST MEDICAL HISTORY:  Past Medical History:  Diagnosis Date   Anemia 2009   noted during hospitalization for surgical I&D of chest, arm abscess   Anxiety    Appendicitis    Arthritis    Depression    Dyspnea    with anxiety and some exertion   Gastric tumor 2013   resection   GERD (gastroesophageal reflux disease)     GI bleed 2013   H/O seasonal allergies    High cholesterol    History of hiatal hernia    Hypertension    IBS (irritable bowel syndrome)    Pneumonia    Sleep apnea    has a cpap machine but seldom uses it.       PAST SURGICAL HISTORY: Past Surgical History:  Procedure Laterality Date   COLONOSCOPY     ESOPHAGOGASTRODUODENOSCOPY  05/05/2011   Procedure: ESOPHAGOGASTRODUODENOSCOPY (EGD);  Surgeon: Hilarie Fredrickson, MD;  Location: [facility] ENDOSCOPY;  Service: Endoscopy;  Laterality: N/A;   GASTRECTOMY     INCISE AND DRAIN ABCESS  2009   patient had abscesses and cellulitis of the chest and arm which grew out microfollicular strap.   KNEE ARTHROPLASTY     KNEE ARTHROSCOPY Right 07/04/2017   WITH DEBRIDMENT   KNEE ARTHROSCOPY Right 07/04/2017   Procedure: RIGHT KNEE ARTHROSCOPY WITH DEBRIDEMENT, MEDIAL MENISCECTOMY;  Surgeon: Valeria Batman, MD;  Location: [facility] OR;  Service: Orthopedics;  Laterality: Right;   TONSILLECTOMY     TOTAL SHOULDER ARTHROPLASTY      FAMILY HISTORY:  Family History  Problem Relation Age of Onset   Diabetes Mother    Stroke Mother    Aneurysm Father        died of brain aneurysm   Anesthesia problems Neg Hx    Hypotension Neg Hx    Malignant hyperthermia Neg Hx  Pseudochol deficiency Neg Hx     SOCIAL HISTORY:  Social History   Socioeconomic History   Marital status: Single    Spouse name: Not on file   Number of children: Not on file   Years of education: Not on file   Highest education level: Not on file  Occupational History   Not on file  Tobacco Use   Smoking status: Never   Smokeless tobacco: Never  Vaping Use   Vaping Use: Never used  Substance and Sexual Activity   Alcohol use: No   Drug use: No   Sexual activity: Not on file  Other Topics Concern   Not on file  Social History Narrative   Not on file   Social Determinants of Health   Financial Resource Strain: Not on file  Food Insecurity: Not on file  Transportation  Needs: Not on file  Physical Activity: Not on file  Stress: Not on file  Social Connections: Not on file  Intimate Partner Violence: Not on file    ALLERGIES: Patient has no known allergies.  MEDICATIONS:  Current Outpatient Medications  Medication Sig Dispense Refill   acetaminophen (TYLENOL) 500 MG tablet Take 1,000 mg by mouth daily as needed for headache (pain). Reported on 08/05/2015     albuterol (VENTOLIN HFA) 108 (90 Base) MCG/ACT inhaler Inhale 1-2 puffs into the lungs every 6 (six) hours as needed for wheezing or shortness of breath. 1 Inhaler 0   Ascorbic Acid (VITAMIN C) 1000 MG tablet Take 1,000 mg by mouth daily.     benazepril-hydrochlorthiazide (LOTENSIN HCT) 20-12.5 MG tablet Take 1 tablet by mouth daily. 10 tablet 0   benzonatate (TESSALON) 100 MG capsule Take 1 capsule (100 mg total) by mouth every 8 (eight) hours. 24 capsule 0   cetirizine (ZYRTEC) 10 MG tablet Take 1 tablet (10 mg total) by mouth daily. 30 tablet 0   losartan-hydrochlorothiazide (HYZAAR) 100-12.5 MG tablet Take 1 tablet by mouth daily.     Multiple Vitamin (MULITIVITAMIN WITH MINERALS) TABS Take 1 tablet by mouth daily.     omeprazole (PRILOSEC) 40 MG capsule Take 1 capsule (40 mg total) by mouth daily. (Patient taking differently: Take 40 mg by mouth daily as needed (acid reflux/ heartburn). ) 30 capsule 3   rosuvastatin (CRESTOR) 10 MG tablet TAKE 1 TABLET(10 MG) BY MOUTH AT BEDTIME (Patient taking differently: Take 10 mg by mouth at bedtime. ) 90 tablet 1   No current facility-administered medications for this encounter.    REVIEW OF SYSTEMS:  On review of systems, the patient reports that he is doing well overall. He denies any chest pain, shortness of breath, cough, fevers, chills, night sweats, unintended weight changes. He denies any bowel disturbances, and denies abdominal pain, nausea or vomiting. He denies any new musculoskeletal or joint aches or pains. His IPSS was ***, indicating *** urinary  symptoms. His SHIM was ***, indicating he {does not have/has mild/moderate/severe} erectile dysfunction. A complete review of systems is obtained and is otherwise negative.    PHYSICAL EXAM:  Wt Readings from Last 3 Encounters:  10/20/21 250 lb (113.4 kg)  04/27/18 250 lb (113.4 kg)  04/05/18 240 lb (108.9 kg)   Temp Readings from Last 3 Encounters:  10/20/21 98 F (36.7 C)  08/17/18 98.4 F (36.9 C) (Oral)  08/01/18 98.3 F (36.8 C) (Oral)   BP Readings from Last 3 Encounters:  10/20/21 106/82  08/17/18 117/88  08/01/18 122/87   Pulse Readings from Last 3 Encounters:  10/20/21 72  08/17/18 80  08/01/18 70    /10  In general this is a well appearing *** male in no acute distress. He's alert and oriented x4 and appropriate throughout the examination. Cardiopulmonary assessment is negative for acute distress, and he exhibits normal effort.     KPS = ***  100 - Normal; no complaints; no evidence of disease. 90   - Able to carry on normal activity; minor signs or symptoms of disease. 80   - Normal activity with effort; some signs or symptoms of disease. 30   - Cares for self; unable to carry on normal activity or to do active work. 60   - Requires occasional assistance, but is able to care for most of his personal needs. 50   - Requires considerable assistance and frequent medical care. 40   - Disabled; requires special care and assistance. 30   - Severely disabled; hospital admission is indicated although death not imminent. 20   - Very sick; hospital admission necessary; active supportive treatment necessary. 10   - Moribund; fatal processes progressing rapidly. 0     - Dead  Karnofsky DA, Abelmann WH, Craver LS and Burchenal [facility] [phone]) The use of the nitrogen mustards in the palliative treatment of carcinoma: with particular reference to bronchogenic carcinoma Cancer 1 634-56  LABORATORY DATA:  Lab Results  Component Value Date   WBC 9.4 10/20/2021   HGB 15.5  10/20/2021   HCT 45.9 10/20/2021   MCV 89.1 10/20/2021   PLT 198 10/20/2021   Lab Results  Component Value Date   NA 139 10/20/2021   K 3.8 10/20/2021   CL 106 10/20/2021   CO2 23 10/20/2021   Lab Results  Component Value Date   ALT 14 04/12/2017   AST 15 04/12/2017   ALKPHOS 91 04/12/2017   BILITOT 1.1 04/12/2017     RADIOGRAPHY: No results found.    IMPRESSION/PLAN: 1. 67 y.o. gentleman with Stage T1c adenocarcinoma of the prostate with Gleason Score of 3+4, and PSA of 4.5. We discussed the patient's workup and outlined the nature of prostate cancer in this setting. The patient's T stage, Gleason's score, and PSA put him into the favorable intermediate risk group. Accordingly, he is eligible for a variety of potential treatment options including brachytherapy, 5.5 weeks of external radiation, or prostatectomy. We discussed the available radiation techniques, and focused on the details and logistics of delivery. The patient may not be an ideal candidate for brachytherapy with a prostate volume of 66 cc {prior to downsizing from hormone therapy. We discussed that based on his prostate volume, he would require beginning treatment with a 5 alpha reductase inhibitor and ADT for at least 3 months to allow for downsizing of the prostate prior to initiating radiotherapy}. We discussed and outlined the risks, benefits, short and long-term effects associated with radiotherapy and compared and contrasted these with prostatectomy. We discussed the role of SpaceOAR gel in reducing the rectal toxicity associated with radiotherapy. He appears to have a good understanding of his disease and our treatment recommendations which are of curative intent.  He was encouraged to ask questions that were answered to his stated satisfaction.  At the conclusion of our conversation, the patient is interested in moving forward with ***.  We personally spent *** minutes in this encounter including chart review,  reviewing radiological studies, meeting face-to-face with the patient, entering orders and completing documentation.    Marguarite Arbour, PA-C    Margaretmary Dys,  MD  [facility]  Radiation Oncology Direct Dial: [phone]  Fax: [phone] [email or web address]  Skype  LinkedIn

## 2022-06-20 ENCOUNTER — Ambulatory Visit
Admission: RE | Admit: 2022-06-20 | Discharge: 2022-06-20 | Disposition: A | Discharge status: Home / Self Care | Payer: Medicare HMO | Source: Ambulatory Visit | Attending: Radiation Oncology | Admitting: Radiation Oncology

## 2022-06-20 ENCOUNTER — Encounter: Payer: Self-pay | Admitting: Radiation Oncology

## 2022-06-20 VITALS — BP 120/80 | HR 66 | Temp 97.6°F | Resp 20 | Ht 69.0 in | Wt 269.2 lb

## 2022-06-20 DIAGNOSIS — Z8673 Personal history of transient ischemic attack (TIA), and cerebral infarction without residual deficits: Secondary | ICD-10-CM | POA: Insufficient documentation

## 2022-06-20 DIAGNOSIS — E7800 Pure hypercholesterolemia, unspecified: Secondary | ICD-10-CM | POA: Diagnosis not present

## 2022-06-20 DIAGNOSIS — C61 Malignant neoplasm of prostate: Secondary | ICD-10-CM | POA: Diagnosis present

## 2022-06-20 DIAGNOSIS — K589 Irritable bowel syndrome without diarrhea: Secondary | ICD-10-CM | POA: Insufficient documentation

## 2022-06-20 DIAGNOSIS — Z79899 Other long term (current) drug therapy: Secondary | ICD-10-CM | POA: Insufficient documentation

## 2022-06-20 DIAGNOSIS — I10 Essential (primary) hypertension: Secondary | ICD-10-CM | POA: Diagnosis not present

## 2022-06-20 DIAGNOSIS — G4730 Sleep apnea, unspecified: Secondary | ICD-10-CM | POA: Diagnosis not present

## 2022-06-20 DIAGNOSIS — K219 Gastro-esophageal reflux disease without esophagitis: Secondary | ICD-10-CM | POA: Diagnosis not present

## 2022-06-20 NOTE — Progress Notes (Signed)
Introduced myself to the patient as the prostate nurse navigator.  No barriers to care identified at this time.  He is here to discuss his radiation treatment options.  I gave him my business card and asked him to call me with questions or concerns.  Verbalized understanding.  ?

## 2022-06-24 NOTE — Progress Notes (Addendum)
RN spoke with staff at [facility] to follow up to get patient set up for surgical consult.    Pending review and call back at this time.  Will continue to follow.   Pt aware that we are awaiting date.

## 2022-06-28 NOTE — Progress Notes (Signed)
RN reached out to [facility] to follow up with request for surgical consult.   Additional request replaced, will continue to follow to ensure appointment is scheduled.    Urologist notified of request as well.

## 2022-06-30 NOTE — Progress Notes (Signed)
Patient is scheduled for surgical consult with Dr. Marlou Porch on 5/7.  Patient is aware of appointment.   RN will follow to ensure treatment decision is finalized.

## 2022-07-05 NOTE — Progress Notes (Signed)
Patient was a consult on 4/22 for his stage T1c adenocarcinoma of the prostate with Gleason Score of 3+4, and PSA of 4.5, and had surgical consult with Dr. Marlou Porch today, 5/7.   Patient has decided to proceed with 5.5 weeks of external radiation. RN provided education on next steps for coordinating for fiducial's, spaceOAR, and CT Simulation.   MD's notified, plan of care in progress.

## 2022-07-06 ENCOUNTER — Other Ambulatory Visit: Payer: Self-pay | Admitting: Urology

## 2022-07-14 NOTE — Progress Notes (Signed)
Patient notified this RN that he does not have a support person or responsible party once he is discharged from fiducial's and spaceOAR on 7/9.    Per Dr. Arita Miss they will admit for overnight observation post fiducial's and spaceOAR.   Pt notified.  Verbalized understanding and agreement.

## 2022-07-26 ENCOUNTER — Telehealth: Payer: Self-pay | Admitting: *Deleted

## 2022-07-26 NOTE — Telephone Encounter (Signed)
CALLED PATIENT TO INFORM OF SIM APPT. BEING MOVED TO 08-05-22- ARRIVAL TIME- 7:45 AM @ [facility], INFORMED PATIENT TO ARRIVE WITH A FULL BLADDER, SPOKE WITH PATIENT AND HE IS AWARE OF THIS APPT. AND THE INSTRUCTIONS

## 2022-07-31 NOTE — Progress Notes (Signed)
  Radiation Oncology         [phone]) [phone] ________________________________  Name: JYMERE SQUIERS MRN: [id number]  Date: 08/05/2022  DOB: [birth year]  SIMULATION AND TREATMENT PLANNING NOTE    ICD-10-CM   1. Malignant neoplasm of prostate (HCC)  C61       DIAGNOSIS:  67 y.o. gentleman with Stage T1c adenocarcinoma of the prostate with Gleason score of 3+4, and PSA of 4.5.   NARRATIVE:  The patient was brought to the CT Simulation planning suite.  Identity was confirmed.  All relevant records and images related to the planned course of therapy were reviewed.  The patient freely provided informed written consent to proceed with treatment after reviewing the details related to the planned course of therapy. The consent form was witnessed and verified by the simulation staff.  Then, the patient was set-up in a stable reproducible supine position for radiation therapy.  A vacuum lock pillow device was custom fabricated to position his legs in a reproducible immobilized position.  Then, supervised the performance of a urethrogram under sterile conditions to identify the prostatic apex.  CT images were obtained.  Surface markings were placed.  The CT images were loaded into the planning software.  Then the prostate target and avoidance structures including the rectum, bladder, bowel and hips were contoured.  Treatment planning then occurred.  The radiation prescription was entered and confirmed.  A total of one complex treatment devices was fabricated. I have requested : Intensity Modulated Radiotherapy (IMRT) is medically necessary for this case for the following reason:  Rectal sparing.  I have requested daily cone beam CT volumetric image gudiance to track gold fiducial posiitoning along with bladder and rectal filling, this is medically necessary to assure accurate positioning of high dose radiation.  PLAN:  The patient will receive 70 Gy in 28 fractions.  ________________________________  Artist Pais  Kathrynn Running, M.D.

## 2022-08-03 ENCOUNTER — Telehealth: Payer: Self-pay | Admitting: *Deleted

## 2022-08-03 NOTE — Telephone Encounter (Signed)
CALLED PATIENT TO REMIND OF SIM APPT. FOR  08-05-22- ARRIVAL TIME- 7:45 AM @ [facility], INFORMED PATIENT TO ARRIVE WITH A FULL BLADDER, SPOKE WITH PATIENT AND HE IS AWARE OF THIS APPT. AND THE INSTRUCTIONS

## 2022-08-05 ENCOUNTER — Ambulatory Visit
Admission: RE | Admit: 2022-08-05 | Discharge: 2022-08-05 | Disposition: A | Discharge status: Home / Self Care | Payer: Medicare HMO | Source: Ambulatory Visit | Attending: Radiation Oncology | Admitting: Radiation Oncology

## 2022-08-05 ENCOUNTER — Other Ambulatory Visit: Payer: Self-pay

## 2022-08-05 DIAGNOSIS — C61 Malignant neoplasm of prostate: Secondary | ICD-10-CM | POA: Diagnosis present

## 2022-08-05 DIAGNOSIS — Z510 Encounter for antineoplastic radiation therapy: Secondary | ICD-10-CM | POA: Diagnosis present

## 2022-08-08 DIAGNOSIS — Z510 Encounter for antineoplastic radiation therapy: Secondary | ICD-10-CM | POA: Diagnosis not present

## 2022-08-19 NOTE — Progress Notes (Signed)
RN left voicemail for call back to assess any new barriers or needs prior to starting radiation treatment on 6/24.

## 2022-08-22 ENCOUNTER — Ambulatory Visit
Admission: RE | Admit: 2022-08-22 | Discharge: 2022-08-22 | Disposition: A | Discharge status: Home / Self Care | Payer: Medicare HMO | Source: Ambulatory Visit | Attending: Radiation Oncology | Admitting: Radiation Oncology

## 2022-08-22 ENCOUNTER — Other Ambulatory Visit: Payer: Self-pay

## 2022-08-22 DIAGNOSIS — C61 Malignant neoplasm of prostate: Secondary | ICD-10-CM

## 2022-08-22 DIAGNOSIS — Z510 Encounter for antineoplastic radiation therapy: Secondary | ICD-10-CM | POA: Diagnosis not present

## 2022-08-22 LAB — RAD ONC ARIA SESSION SUMMARY
Course Elapsed Days: 0
Plan Fractions Treated to Date: 1
Plan Prescribed Dose Per Fraction: 2.5 Gy
Plan Total Fractions Prescribed: 28
Plan Total Prescribed Dose: 70 Gy
Reference Point Dosage Given to Date: 2.5 Gy
Reference Point Session Dosage Given: 2.5 Gy
Session Number: 1

## 2022-08-23 ENCOUNTER — Other Ambulatory Visit: Payer: Self-pay

## 2022-08-23 ENCOUNTER — Ambulatory Visit
Admission: RE | Admit: 2022-08-23 | Discharge: 2022-08-23 | Disposition: A | Discharge status: Home / Self Care | Payer: Medicare HMO | Source: Ambulatory Visit | Attending: Radiation Oncology | Admitting: Radiation Oncology

## 2022-08-23 DIAGNOSIS — Z510 Encounter for antineoplastic radiation therapy: Secondary | ICD-10-CM | POA: Diagnosis not present

## 2022-08-23 LAB — RAD ONC ARIA SESSION SUMMARY
Course Elapsed Days: 1
Plan Fractions Treated to Date: 2
Plan Prescribed Dose Per Fraction: 2.5 Gy
Plan Total Fractions Prescribed: 28
Plan Total Prescribed Dose: 70 Gy
Reference Point Dosage Given to Date: 5 Gy
Reference Point Session Dosage Given: 2.5 Gy
Session Number: 2

## 2022-08-24 ENCOUNTER — Ambulatory Visit
Admission: RE | Admit: 2022-08-24 | Discharge: 2022-08-24 | Disposition: A | Discharge status: Home / Self Care | Payer: Medicare HMO | Source: Ambulatory Visit | Attending: Radiation Oncology | Admitting: Radiation Oncology

## 2022-08-24 ENCOUNTER — Other Ambulatory Visit: Payer: Self-pay

## 2022-08-24 DIAGNOSIS — Z510 Encounter for antineoplastic radiation therapy: Secondary | ICD-10-CM | POA: Diagnosis not present

## 2022-08-24 LAB — RAD ONC ARIA SESSION SUMMARY
Course Elapsed Days: 2
Plan Fractions Treated to Date: 3
Plan Prescribed Dose Per Fraction: 2.5 Gy
Plan Total Fractions Prescribed: 28
Plan Total Prescribed Dose: 70 Gy
Reference Point Dosage Given to Date: 7.5 Gy
Reference Point Session Dosage Given: 2.5 Gy
Session Number: 3

## 2022-08-25 ENCOUNTER — Other Ambulatory Visit: Payer: Self-pay

## 2022-08-25 ENCOUNTER — Encounter: Payer: Self-pay | Admitting: Internal Medicine

## 2022-08-25 ENCOUNTER — Ambulatory Visit
Admission: RE | Admit: 2022-08-25 | Discharge: 2022-08-25 | Disposition: A | Discharge status: Home / Self Care | Payer: Medicare HMO | Source: Ambulatory Visit | Attending: Radiation Oncology | Admitting: Radiation Oncology

## 2022-08-25 DIAGNOSIS — Z510 Encounter for antineoplastic radiation therapy: Secondary | ICD-10-CM | POA: Diagnosis not present

## 2022-08-25 LAB — RAD ONC ARIA SESSION SUMMARY
Course Elapsed Days: 3
Plan Fractions Treated to Date: 4
Plan Prescribed Dose Per Fraction: 2.5 Gy
Plan Total Fractions Prescribed: 28
Plan Total Prescribed Dose: 70 Gy
Reference Point Dosage Given to Date: 10 Gy
Reference Point Session Dosage Given: 2.5 Gy
Session Number: 4

## 2022-08-26 ENCOUNTER — Other Ambulatory Visit: Payer: Self-pay

## 2022-08-26 ENCOUNTER — Ambulatory Visit
Admission: RE | Admit: 2022-08-26 | Discharge: 2022-08-26 | Disposition: A | Discharge status: Home / Self Care | Payer: Medicare HMO | Source: Ambulatory Visit | Attending: Radiation Oncology | Admitting: Radiation Oncology

## 2022-08-26 DIAGNOSIS — Z510 Encounter for antineoplastic radiation therapy: Secondary | ICD-10-CM | POA: Diagnosis not present

## 2022-08-26 LAB — RAD ONC ARIA SESSION SUMMARY
Course Elapsed Days: 4
Plan Fractions Treated to Date: 5
Plan Prescribed Dose Per Fraction: 2.5 Gy
Plan Total Fractions Prescribed: 28
Plan Total Prescribed Dose: 70 Gy
Reference Point Dosage Given to Date: 12.5 Gy
Reference Point Session Dosage Given: 2.5 Gy
Session Number: 5

## 2022-08-29 ENCOUNTER — Other Ambulatory Visit: Payer: Self-pay

## 2022-08-29 ENCOUNTER — Ambulatory Visit
Admission: RE | Admit: 2022-08-29 | Discharge: 2022-08-29 | Disposition: A | Discharge status: Home / Self Care | Payer: Medicare HMO | Source: Ambulatory Visit | Attending: Radiation Oncology | Admitting: Radiation Oncology

## 2022-08-29 DIAGNOSIS — R351 Nocturia: Secondary | ICD-10-CM | POA: Insufficient documentation

## 2022-08-29 DIAGNOSIS — C61 Malignant neoplasm of prostate: Secondary | ICD-10-CM | POA: Diagnosis not present

## 2022-08-29 DIAGNOSIS — R3915 Urgency of urination: Secondary | ICD-10-CM | POA: Diagnosis not present

## 2022-08-29 LAB — RAD ONC ARIA SESSION SUMMARY
Course Elapsed Days: 7
Plan Fractions Treated to Date: 6
Plan Prescribed Dose Per Fraction: 2.5 Gy
Plan Total Fractions Prescribed: 28
Plan Total Prescribed Dose: 70 Gy
Reference Point Dosage Given to Date: 15 Gy
Reference Point Session Dosage Given: 2.5 Gy
Session Number: 6

## 2022-08-30 ENCOUNTER — Ambulatory Visit
Admission: RE | Admit: 2022-08-30 | Discharge: 2022-08-30 | Disposition: A | Discharge status: Home / Self Care | Payer: Medicare HMO | Source: Ambulatory Visit | Attending: Radiation Oncology | Admitting: Radiation Oncology

## 2022-08-30 ENCOUNTER — Other Ambulatory Visit: Payer: Self-pay

## 2022-08-30 DIAGNOSIS — C61 Malignant neoplasm of prostate: Secondary | ICD-10-CM | POA: Diagnosis not present

## 2022-08-30 LAB — RAD ONC ARIA SESSION SUMMARY
Course Elapsed Days: 8
Plan Fractions Treated to Date: 7
Plan Prescribed Dose Per Fraction: 2.5 Gy
Plan Total Fractions Prescribed: 28
Plan Total Prescribed Dose: 70 Gy
Reference Point Dosage Given to Date: 17.5 Gy
Reference Point Session Dosage Given: 2.5 Gy
Session Number: 7

## 2022-08-31 ENCOUNTER — Ambulatory Visit
Admission: RE | Admit: 2022-08-31 | Discharge: 2022-08-31 | Disposition: A | Discharge status: Home / Self Care | Payer: Medicare HMO | Source: Ambulatory Visit | Attending: Radiation Oncology | Admitting: Radiation Oncology

## 2022-08-31 ENCOUNTER — Other Ambulatory Visit: Payer: Self-pay

## 2022-08-31 DIAGNOSIS — C61 Malignant neoplasm of prostate: Secondary | ICD-10-CM | POA: Diagnosis not present

## 2022-08-31 LAB — RAD ONC ARIA SESSION SUMMARY
Course Elapsed Days: 9
Plan Fractions Treated to Date: 8
Plan Prescribed Dose Per Fraction: 2.5 Gy
Plan Total Fractions Prescribed: 28
Plan Total Prescribed Dose: 70 Gy
Reference Point Dosage Given to Date: 20 Gy
Reference Point Session Dosage Given: 2.5 Gy
Session Number: 8

## 2022-09-02 ENCOUNTER — Other Ambulatory Visit: Payer: Self-pay

## 2022-09-02 ENCOUNTER — Ambulatory Visit
Admission: RE | Admit: 2022-09-02 | Discharge: 2022-09-02 | Disposition: A | Discharge status: Home / Self Care | Payer: Medicare HMO | Source: Ambulatory Visit | Attending: Radiation Oncology | Admitting: Radiation Oncology

## 2022-09-02 DIAGNOSIS — C61 Malignant neoplasm of prostate: Secondary | ICD-10-CM | POA: Diagnosis not present

## 2022-09-02 LAB — RAD ONC ARIA SESSION SUMMARY
Course Elapsed Days: 11
Plan Fractions Treated to Date: 9
Plan Prescribed Dose Per Fraction: 2.5 Gy
Plan Total Fractions Prescribed: 28
Plan Total Prescribed Dose: 70 Gy
Reference Point Dosage Given to Date: 22.5 Gy
Reference Point Session Dosage Given: 2.5 Gy
Session Number: 9

## 2022-09-05 ENCOUNTER — Other Ambulatory Visit: Payer: Self-pay

## 2022-09-05 ENCOUNTER — Ambulatory Visit
Admission: RE | Admit: 2022-09-05 | Discharge: 2022-09-05 | Disposition: A | Discharge status: Home / Self Care | Payer: Medicare HMO | Source: Ambulatory Visit | Attending: Radiation Oncology | Admitting: Radiation Oncology

## 2022-09-05 DIAGNOSIS — C61 Malignant neoplasm of prostate: Secondary | ICD-10-CM | POA: Diagnosis not present

## 2022-09-05 LAB — RAD ONC ARIA SESSION SUMMARY
Course Elapsed Days: 14
Plan Fractions Treated to Date: 10
Plan Prescribed Dose Per Fraction: 2.5 Gy
Plan Total Fractions Prescribed: 28
Plan Total Prescribed Dose: 70 Gy
Reference Point Dosage Given to Date: 25 Gy
Reference Point Session Dosage Given: 2.5 Gy
Session Number: 10

## 2022-09-06 ENCOUNTER — Ambulatory Visit
Admission: RE | Admit: 2022-09-06 | Discharge: 2022-09-06 | Disposition: A | Discharge status: Home / Self Care | Payer: Medicare HMO | Source: Ambulatory Visit | Attending: Radiation Oncology | Admitting: Radiation Oncology

## 2022-09-06 ENCOUNTER — Other Ambulatory Visit: Payer: Self-pay

## 2022-09-06 ENCOUNTER — Ambulatory Visit (HOSPITAL_BASED_OUTPATIENT_CLINIC_OR_DEPARTMENT_OTHER): Admit: 2022-09-06 | Payer: Medicare HMO | Admitting: Urology

## 2022-09-06 ENCOUNTER — Encounter (HOSPITAL_BASED_OUTPATIENT_CLINIC_OR_DEPARTMENT_OTHER): Payer: Self-pay

## 2022-09-06 DIAGNOSIS — C61 Malignant neoplasm of prostate: Secondary | ICD-10-CM | POA: Diagnosis not present

## 2022-09-06 LAB — RAD ONC ARIA SESSION SUMMARY
Course Elapsed Days: 15
Plan Fractions Treated to Date: 11
Plan Prescribed Dose Per Fraction: 2.5 Gy
Plan Total Fractions Prescribed: 28
Plan Total Prescribed Dose: 70 Gy
Reference Point Dosage Given to Date: 27.5 Gy
Reference Point Session Dosage Given: 2.5 Gy
Session Number: 11

## 2022-09-06 SURGERY — INSERTION, GOLD SEEDS
Anesthesia: Monitor Anesthesia Care

## 2022-09-07 ENCOUNTER — Ambulatory Visit
Admission: RE | Admit: 2022-09-07 | Discharge: 2022-09-07 | Disposition: A | Discharge status: Home / Self Care | Payer: Medicare HMO | Source: Ambulatory Visit | Attending: Radiation Oncology | Admitting: Radiation Oncology

## 2022-09-07 ENCOUNTER — Other Ambulatory Visit: Payer: Self-pay

## 2022-09-07 DIAGNOSIS — C61 Malignant neoplasm of prostate: Secondary | ICD-10-CM | POA: Diagnosis not present

## 2022-09-07 LAB — RAD ONC ARIA SESSION SUMMARY
Course Elapsed Days: 16
Plan Fractions Treated to Date: 12
Plan Prescribed Dose Per Fraction: 2.5 Gy
Plan Total Fractions Prescribed: 28
Plan Total Prescribed Dose: 70 Gy
Reference Point Dosage Given to Date: 30 Gy
Reference Point Session Dosage Given: 2.5 Gy
Session Number: 12

## 2022-09-08 ENCOUNTER — Other Ambulatory Visit: Payer: Self-pay

## 2022-09-08 ENCOUNTER — Ambulatory Visit
Admission: RE | Admit: 2022-09-08 | Discharge: 2022-09-08 | Disposition: A | Discharge status: Home / Self Care | Payer: Medicare HMO | Source: Ambulatory Visit | Attending: Radiation Oncology | Admitting: Radiation Oncology

## 2022-09-08 DIAGNOSIS — C61 Malignant neoplasm of prostate: Secondary | ICD-10-CM | POA: Diagnosis not present

## 2022-09-08 LAB — RAD ONC ARIA SESSION SUMMARY
Course Elapsed Days: 17
Plan Fractions Treated to Date: 13
Plan Prescribed Dose Per Fraction: 2.5 Gy
Plan Total Fractions Prescribed: 28
Plan Total Prescribed Dose: 70 Gy
Reference Point Dosage Given to Date: 32.5 Gy
Reference Point Session Dosage Given: 2.5 Gy
Session Number: 13

## 2022-09-09 ENCOUNTER — Other Ambulatory Visit: Payer: Self-pay

## 2022-09-09 ENCOUNTER — Ambulatory Visit: Admission: RE | Admit: 2022-09-09 | Payer: Medicare HMO | Source: Ambulatory Visit

## 2022-09-09 ENCOUNTER — Ambulatory Visit
Admission: RE | Admit: 2022-09-09 | Discharge: 2022-09-09 | Disposition: A | Discharge status: Home / Self Care | Payer: Medicare HMO | Source: Ambulatory Visit | Attending: Radiation Oncology | Admitting: Radiation Oncology

## 2022-09-09 DIAGNOSIS — C61 Malignant neoplasm of prostate: Secondary | ICD-10-CM | POA: Diagnosis not present

## 2022-09-09 LAB — RAD ONC ARIA SESSION SUMMARY
Course Elapsed Days: 18
Plan Fractions Treated to Date: 14
Plan Prescribed Dose Per Fraction: 2.5 Gy
Plan Total Fractions Prescribed: 28
Plan Total Prescribed Dose: 70 Gy
Reference Point Dosage Given to Date: 35 Gy
Reference Point Session Dosage Given: 2.5 Gy
Session Number: 14

## 2022-09-12 ENCOUNTER — Other Ambulatory Visit: Payer: Self-pay

## 2022-09-12 ENCOUNTER — Ambulatory Visit
Admission: RE | Admit: 2022-09-12 | Discharge: 2022-09-12 | Disposition: A | Discharge status: Home / Self Care | Payer: Medicare HMO | Source: Ambulatory Visit | Attending: Radiation Oncology | Admitting: Radiation Oncology

## 2022-09-12 DIAGNOSIS — C61 Malignant neoplasm of prostate: Secondary | ICD-10-CM | POA: Diagnosis not present

## 2022-09-12 LAB — RAD ONC ARIA SESSION SUMMARY
Course Elapsed Days: 21
Plan Fractions Treated to Date: 15
Plan Prescribed Dose Per Fraction: 2.5 Gy
Plan Total Fractions Prescribed: 28
Plan Total Prescribed Dose: 70 Gy
Reference Point Dosage Given to Date: 37.5 Gy
Reference Point Session Dosage Given: 2.5 Gy
Session Number: 15

## 2022-09-13 ENCOUNTER — Other Ambulatory Visit: Payer: Self-pay

## 2022-09-13 ENCOUNTER — Ambulatory Visit
Admission: RE | Admit: 2022-09-13 | Discharge: 2022-09-13 | Disposition: A | Discharge status: Home / Self Care | Payer: Medicare HMO | Source: Ambulatory Visit | Attending: Radiation Oncology | Admitting: Radiation Oncology

## 2022-09-13 DIAGNOSIS — C61 Malignant neoplasm of prostate: Secondary | ICD-10-CM | POA: Diagnosis not present

## 2022-09-13 LAB — RAD ONC ARIA SESSION SUMMARY
Course Elapsed Days: 22
Plan Fractions Treated to Date: 16
Plan Prescribed Dose Per Fraction: 2.5 Gy
Plan Total Fractions Prescribed: 28
Plan Total Prescribed Dose: 70 Gy
Reference Point Dosage Given to Date: 40 Gy
Reference Point Session Dosage Given: 2.5 Gy
Session Number: 16

## 2022-09-14 ENCOUNTER — Other Ambulatory Visit: Payer: Self-pay

## 2022-09-14 ENCOUNTER — Ambulatory Visit
Admission: RE | Admit: 2022-09-14 | Discharge: 2022-09-14 | Disposition: A | Discharge status: Home / Self Care | Payer: Medicare HMO | Source: Ambulatory Visit | Attending: Radiation Oncology | Admitting: Radiation Oncology

## 2022-09-14 DIAGNOSIS — C61 Malignant neoplasm of prostate: Secondary | ICD-10-CM | POA: Diagnosis not present

## 2022-09-14 LAB — RAD ONC ARIA SESSION SUMMARY
Course Elapsed Days: 23
Plan Fractions Treated to Date: 17
Plan Prescribed Dose Per Fraction: 2.5 Gy
Plan Total Fractions Prescribed: 28
Plan Total Prescribed Dose: 70 Gy
Reference Point Dosage Given to Date: 42.5 Gy
Reference Point Session Dosage Given: 2.5 Gy
Session Number: 17

## 2022-09-15 ENCOUNTER — Other Ambulatory Visit: Payer: Self-pay

## 2022-09-15 ENCOUNTER — Ambulatory Visit
Admission: RE | Admit: 2022-09-15 | Discharge: 2022-09-15 | Disposition: A | Discharge status: Home / Self Care | Payer: Medicare HMO | Source: Ambulatory Visit | Attending: Radiation Oncology | Admitting: Radiation Oncology

## 2022-09-15 DIAGNOSIS — C61 Malignant neoplasm of prostate: Secondary | ICD-10-CM | POA: Diagnosis not present

## 2022-09-15 LAB — RAD ONC ARIA SESSION SUMMARY
Course Elapsed Days: 24
Plan Fractions Treated to Date: 18
Plan Prescribed Dose Per Fraction: 2.5 Gy
Plan Total Fractions Prescribed: 28
Plan Total Prescribed Dose: 70 Gy
Reference Point Dosage Given to Date: 45 Gy
Reference Point Session Dosage Given: 2.5 Gy
Session Number: 18

## 2022-09-16 ENCOUNTER — Ambulatory Visit: Payer: Medicare HMO

## 2022-09-16 ENCOUNTER — Other Ambulatory Visit: Payer: Self-pay

## 2022-09-16 ENCOUNTER — Ambulatory Visit
Admission: RE | Admit: 2022-09-16 | Discharge: 2022-09-16 | Disposition: A | Discharge status: Home / Self Care | Payer: Medicare HMO | Source: Ambulatory Visit | Attending: Radiation Oncology | Admitting: Radiation Oncology

## 2022-09-19 ENCOUNTER — Other Ambulatory Visit: Payer: Self-pay

## 2022-09-19 ENCOUNTER — Ambulatory Visit: Admission: RE | Admit: 2022-09-19 | Payer: Medicare HMO | Source: Ambulatory Visit

## 2022-09-19 ENCOUNTER — Ambulatory Visit: Payer: Medicare HMO

## 2022-09-19 DIAGNOSIS — C61 Malignant neoplasm of prostate: Secondary | ICD-10-CM | POA: Diagnosis not present

## 2022-09-19 LAB — RAD ONC ARIA SESSION SUMMARY
Course Elapsed Days: 28
Plan Fractions Treated to Date: 19
Plan Prescribed Dose Per Fraction: 2.5 Gy
Plan Total Fractions Prescribed: 28
Plan Total Prescribed Dose: 70 Gy
Reference Point Dosage Given to Date: 47.5 Gy
Reference Point Session Dosage Given: 2.5 Gy
Session Number: 19

## 2022-09-20 ENCOUNTER — Ambulatory Visit
Admission: RE | Admit: 2022-09-20 | Discharge: 2022-09-20 | Disposition: A | Discharge status: Home / Self Care | Payer: Medicare HMO | Source: Ambulatory Visit | Attending: Radiation Oncology | Admitting: Radiation Oncology

## 2022-09-20 ENCOUNTER — Telehealth: Payer: Self-pay

## 2022-09-20 ENCOUNTER — Other Ambulatory Visit: Payer: Self-pay

## 2022-09-20 ENCOUNTER — Ambulatory Visit: Admission: RE | Admit: 2022-09-20 | Payer: Medicare HMO | Source: Ambulatory Visit

## 2022-09-20 ENCOUNTER — Ambulatory Visit
Admission: RE | Admit: 2022-09-20 | Payer: Medicare HMO | Source: Ambulatory Visit | Attending: Radiation Oncology | Admitting: Radiation Oncology

## 2022-09-20 DIAGNOSIS — C61 Malignant neoplasm of prostate: Secondary | ICD-10-CM

## 2022-09-20 LAB — URINALYSIS, COMPLETE (UACMP) WITH MICROSCOPIC
Bilirubin Urine: NEGATIVE
Glucose, UA: NEGATIVE mg/dL
Hgb urine dipstick: NEGATIVE
Ketones, ur: NEGATIVE mg/dL
Nitrite: NEGATIVE
Protein, ur: NEGATIVE mg/dL
Specific Gravity, Urine: 1.014 (ref 1.005–1.030)
pH: 6 (ref 5.0–8.0)

## 2022-09-20 LAB — RAD ONC ARIA SESSION SUMMARY
Course Elapsed Days: 29
Plan Fractions Treated to Date: 20
Plan Prescribed Dose Per Fraction: 2.5 Gy
Plan Total Fractions Prescribed: 28
Plan Total Prescribed Dose: 70 Gy
Reference Point Dosage Given to Date: 50 Gy
Reference Point Session Dosage Given: 2.5 Gy
Session Number: 20

## 2022-09-20 NOTE — Progress Notes (Signed)
Please call patient with normal result.  Thanks. MM 

## 2022-09-20 NOTE — Telephone Encounter (Signed)
Rn called John Keller at Dr. Kathrynn Running request to inform him of normal results with recent urinalysis. John Keller understood the results and had no questions at this time.

## 2022-09-21 ENCOUNTER — Other Ambulatory Visit: Payer: Self-pay

## 2022-09-21 ENCOUNTER — Ambulatory Visit
Admission: RE | Admit: 2022-09-21 | Discharge: 2022-09-21 | Disposition: A | Discharge status: Home / Self Care | Payer: Medicare HMO | Source: Ambulatory Visit | Attending: Radiation Oncology | Admitting: Radiation Oncology

## 2022-09-21 DIAGNOSIS — C61 Malignant neoplasm of prostate: Secondary | ICD-10-CM | POA: Diagnosis not present

## 2022-09-21 LAB — RAD ONC ARIA SESSION SUMMARY
Course Elapsed Days: 30
Plan Fractions Treated to Date: 21
Plan Prescribed Dose Per Fraction: 2.5 Gy
Plan Total Fractions Prescribed: 28
Plan Total Prescribed Dose: 70 Gy
Reference Point Dosage Given to Date: 52.5 Gy
Reference Point Session Dosage Given: 2.5 Gy
Session Number: 21

## 2022-09-21 LAB — URINE CULTURE: Culture: <10000 — AB

## 2022-09-21 NOTE — Progress Notes (Signed)
Please call patient with normal result.  Thanks. MM 

## 2022-09-22 ENCOUNTER — Other Ambulatory Visit: Payer: Self-pay

## 2022-09-22 ENCOUNTER — Ambulatory Visit: Payer: Medicare HMO | Admitting: Radiation Oncology

## 2022-09-22 ENCOUNTER — Ambulatory Visit
Admission: RE | Admit: 2022-09-22 | Discharge: 2022-09-22 | Disposition: A | Discharge status: Home / Self Care | Payer: Medicare HMO | Source: Ambulatory Visit | Attending: Radiation Oncology | Admitting: Radiation Oncology

## 2022-09-22 DIAGNOSIS — C61 Malignant neoplasm of prostate: Secondary | ICD-10-CM | POA: Diagnosis not present

## 2022-09-22 LAB — RAD ONC ARIA SESSION SUMMARY
Course Elapsed Days: 31
Plan Fractions Treated to Date: 22
Plan Prescribed Dose Per Fraction: 2.5 Gy
Plan Total Fractions Prescribed: 28
Plan Total Prescribed Dose: 70 Gy
Reference Point Dosage Given to Date: 55 Gy
Reference Point Session Dosage Given: 2.5 Gy
Session Number: 22

## 2022-09-23 ENCOUNTER — Ambulatory Visit
Admission: RE | Admit: 2022-09-23 | Discharge: 2022-09-23 | Disposition: A | Discharge status: Home / Self Care | Payer: Medicare HMO | Source: Ambulatory Visit | Attending: Radiation Oncology | Admitting: Radiation Oncology

## 2022-09-23 ENCOUNTER — Other Ambulatory Visit: Payer: Self-pay

## 2022-09-23 ENCOUNTER — Ambulatory Visit: Admission: RE | Admit: 2022-09-23 | Payer: Medicare HMO | Source: Ambulatory Visit

## 2022-09-23 ENCOUNTER — Telehealth: Payer: Self-pay

## 2022-09-23 DIAGNOSIS — C61 Malignant neoplasm of prostate: Secondary | ICD-10-CM | POA: Diagnosis not present

## 2022-09-23 LAB — RAD ONC ARIA SESSION SUMMARY
Course Elapsed Days: 32
Plan Fractions Treated to Date: 23
Plan Prescribed Dose Per Fraction: 2.5 Gy
Plan Total Fractions Prescribed: 28
Plan Total Prescribed Dose: 70 Gy
Reference Point Dosage Given to Date: 57.5 Gy
Reference Point Session Dosage Given: 2.5 Gy
Session Number: 23

## 2022-09-23 NOTE — Telephone Encounter (Signed)
RN left message for pt to inform him of recent urine culture results (no concerns for UTI). Call back information left for pt if he has any further questions or concerns.

## 2022-09-23 NOTE — Progress Notes (Signed)
Patient called.  Left message for patient to call back. RN Victorino Dike called pt to inform of recent test results. Call back information left with message for pt.

## 2022-09-26 ENCOUNTER — Ambulatory Visit
Admission: RE | Admit: 2022-09-26 | Discharge: 2022-09-26 | Disposition: A | Discharge status: Home / Self Care | Payer: Medicare HMO | Source: Ambulatory Visit | Attending: Radiation Oncology | Admitting: Radiation Oncology

## 2022-09-26 ENCOUNTER — Other Ambulatory Visit: Payer: Self-pay

## 2022-09-26 DIAGNOSIS — C61 Malignant neoplasm of prostate: Secondary | ICD-10-CM | POA: Diagnosis not present

## 2022-09-26 LAB — RAD ONC ARIA SESSION SUMMARY
Course Elapsed Days: 35
Plan Fractions Treated to Date: 24
Plan Prescribed Dose Per Fraction: 2.5 Gy
Plan Total Fractions Prescribed: 28
Plan Total Prescribed Dose: 70 Gy
Reference Point Dosage Given to Date: 60 Gy
Reference Point Session Dosage Given: 2.5 Gy
Session Number: 24

## 2022-09-27 ENCOUNTER — Other Ambulatory Visit: Payer: Self-pay

## 2022-09-27 ENCOUNTER — Ambulatory Visit: Admission: RE | Admit: 2022-09-27 | Payer: Medicare HMO | Source: Ambulatory Visit

## 2022-09-27 DIAGNOSIS — C61 Malignant neoplasm of prostate: Secondary | ICD-10-CM | POA: Diagnosis not present

## 2022-09-27 LAB — RAD ONC ARIA SESSION SUMMARY
Course Elapsed Days: 36
Plan Fractions Treated to Date: 25
Plan Prescribed Dose Per Fraction: 2.5 Gy
Plan Total Fractions Prescribed: 28
Plan Total Prescribed Dose: 70 Gy
Reference Point Dosage Given to Date: 62.5 Gy
Reference Point Session Dosage Given: 2.5 Gy
Session Number: 25

## 2022-09-28 ENCOUNTER — Other Ambulatory Visit: Payer: Self-pay

## 2022-09-28 ENCOUNTER — Ambulatory Visit: Admission: RE | Admit: 2022-09-28 | Payer: Medicare HMO | Source: Ambulatory Visit

## 2022-09-28 DIAGNOSIS — C61 Malignant neoplasm of prostate: Secondary | ICD-10-CM | POA: Diagnosis not present

## 2022-09-28 LAB — RAD ONC ARIA SESSION SUMMARY
Course Elapsed Days: 37
Plan Fractions Treated to Date: 26
Plan Prescribed Dose Per Fraction: 2.5 Gy
Plan Total Fractions Prescribed: 28
Plan Total Prescribed Dose: 70 Gy
Reference Point Dosage Given to Date: 65 Gy
Reference Point Session Dosage Given: 2.5 Gy
Session Number: 26

## 2022-09-29 ENCOUNTER — Other Ambulatory Visit: Payer: Self-pay

## 2022-09-29 ENCOUNTER — Ambulatory Visit
Admission: RE | Admit: 2022-09-29 | Discharge: 2022-09-29 | Disposition: A | Discharge status: Home / Self Care | Payer: Medicare HMO | Source: Ambulatory Visit | Attending: Radiation Oncology | Admitting: Radiation Oncology

## 2022-09-29 ENCOUNTER — Ambulatory Visit: Payer: Medicare HMO

## 2022-09-29 DIAGNOSIS — Z510 Encounter for antineoplastic radiation therapy: Secondary | ICD-10-CM | POA: Diagnosis present

## 2022-09-29 DIAGNOSIS — C61 Malignant neoplasm of prostate: Secondary | ICD-10-CM | POA: Insufficient documentation

## 2022-09-29 LAB — RAD ONC ARIA SESSION SUMMARY
Course Elapsed Days: 38
Plan Fractions Treated to Date: 27
Plan Prescribed Dose Per Fraction: 2.5 Gy
Plan Total Fractions Prescribed: 28
Plan Total Prescribed Dose: 70 Gy
Reference Point Dosage Given to Date: 67.5 Gy
Reference Point Session Dosage Given: 2.5 Gy
Session Number: 27

## 2022-09-30 ENCOUNTER — Other Ambulatory Visit: Payer: Self-pay

## 2022-09-30 ENCOUNTER — Ambulatory Visit
Admission: RE | Admit: 2022-09-30 | Discharge: 2022-09-30 | Disposition: A | Discharge status: Home / Self Care | Payer: Medicare HMO | Source: Ambulatory Visit | Attending: Radiation Oncology | Admitting: Radiation Oncology

## 2022-09-30 DIAGNOSIS — Z510 Encounter for antineoplastic radiation therapy: Secondary | ICD-10-CM | POA: Diagnosis not present

## 2022-09-30 LAB — RAD ONC ARIA SESSION SUMMARY
Course Elapsed Days: 39
Plan Fractions Treated to Date: 28
Plan Prescribed Dose Per Fraction: 2.5 Gy
Plan Total Fractions Prescribed: 28
Plan Total Prescribed Dose: 70 Gy
Reference Point Dosage Given to Date: 70 Gy
Reference Point Session Dosage Given: 2.5 Gy
Session Number: 28

## 2022-10-03 NOTE — Radiation Completion Notes (Addendum)
  Radiation Oncology         [phone]) [phone] ________________________________  Name: John Keller MRN: [id number]  Date: 09/30/2022  DOB: 1955-09-26  Referring Physician: VALLI SHANK, M.D. Date of Service: 2022-10-03 Radiation Oncologist: Adina Barge, M.D. [facility]     RADIATION ONCOLOGY END OF TREATMENT NOTE     Diagnosis:  67 y.o. gentleman with Stage T1c adenocarcinoma of the prostate with Gleason score of 3+4, and PSA of 4.5.   Intent: Curative     ==========DELIVERED PLANS==========  First Treatment Date: 2022-08-22 - Last Treatment Date: 2022-09-30   Plan Name: Prostate Site: Prostate Technique: IMRT Mode: Photon Dose Per Fraction: 2.5 Gy Prescribed Dose (Delivered / Prescribed): 70 Gy / 70 Gy Prescribed Fxs (Delivered / Prescribed): 28 / 28     ==========ON TREATMENT VISIT DATES========== 2022-08-26, 2022-08-31, 2022-09-09, 2022-09-20, 2022-09-23, 2022-09-30   See weekly On Treatment Notes in Epic for details.  He tolerated the daily radiation treatments relatively well with some increased LUTS and modest fatigue.  The patient will receive a call in about one month from the radiation oncology department. He will continue follow up with his urologist, Dr. SHANK, as well.  ------------------------------------------------   Donnice Barge, MD [facility]  Radiation Oncology Direct Dial: [phone]  Fax: [phone] [email or web address]  Skype  LinkedIn

## 2022-10-04 NOTE — Progress Notes (Signed)
Patient was a RadOnc Consult on 06/20/22 for his stage T1c adenocarcinoma of the prostate with Gleason score of 3+4, and PSA of 4.5.  Patient proceed with treatment recommendations of 5.5 weeks of external radiation and had his final radiation treatment on 09/30/22.   Patient is scheduled for a post treatment nurse call on 11/08/22 and has his first post treatment PSA on 01/06/23 at [facility].    RN spoke with patient and provided education on post treatment PSA monitoring.  No additional needs at this time.

## 2022-11-08 ENCOUNTER — Ambulatory Visit
Admission: RE | Admit: 2022-11-08 | Discharge: 2022-11-08 | Disposition: A | Discharge status: Home / Self Care | Payer: Medicare HMO | Source: Ambulatory Visit | Attending: Radiation Oncology | Admitting: Radiation Oncology

## 2022-11-08 NOTE — Progress Notes (Signed)
  Radiation Oncology         [phone]) [phone] ________________________________  Name: John Keller MRN: [id number]  Date of Service: 11/08/2022  DOB: 08/14/1955  Post Treatment Telephone Note  Diagnosis:  C61 Malignant neoplasm of prostate (as documented in provider EOT note)   Pre Treatment IPSS Score: 3 (as documented in the provider consult note)   The patient was available for call today.   Symptoms of fatigue have improved mildly since completing therapy.  Symptoms of bladder changes have improved since completing therapy. Current symptoms include urinary urgency, and medications for bladder symptoms include Tamsulosin.  Symptoms of bowel changes have not improved since completing therapy. Current symptoms include diarrhea, and medications for bowel symptoms include Imodium.   Post Treatment IPSS Score: IPSS Questionnaire (AUA-7): Over the past month.   1)  How often have you had a sensation of not emptying your bladder completely after you finish urinating?  4 - More than half the time  2)  How often have you had to urinate again less than two hours after you finished urinating? 4 - More than half the time  3)  How often have you found you stopped and started again several times when you urinated?  4 - More than half the time  4) How difficult have you found it to postpone urination?  4 - More than half the time  5) How often have you had a weak urinary stream?  3 - About half the time  6) How often have you had to push or strain to begin urination?  1 - Less than 1 time in 5  7) How many times did you most typically get up to urinate from the time you went to bed until the time you got up in the morning?  3 - 3 times  Total score:  23. Which indicates severe symptoms  0-7 mildly symptomatic   8-19 moderately symptomatic   20-35 severely symptomatic    Patient has a scheduled follow up visit with his urologist, Dr. Arita Miss, on 12/2022 for ongoing surveillance. He was counseled  that PSA levels will be drawn in the urology office, and was reassured that additional time is expected to improve bowel and bladder symptoms. He was encouraged to call back with concerns or questions regarding radiation.   This concludes the interaction.  Ruel Favors, LPN

## 2022-11-14 ENCOUNTER — Other Ambulatory Visit: Payer: Self-pay | Admitting: *Deleted

## 2022-11-14 DIAGNOSIS — Z125 Encounter for screening for malignant neoplasm of prostate: Secondary | ICD-10-CM
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
## 2022-11-17 ENCOUNTER — Telehealth: Payer: Self-pay

## 2022-11-17 NOTE — Telephone Encounter (Signed)
Patient informed PSA elevated at 6.2. Patient stated stated that he just completed radiation in 09/2022 for low-grade PSA cancer, and also had sexual intercourse with his wife on 11/13/2022. Patient informed his results will be forwarded to Deberah Castle, RN (PSA RN Navigator) to be reviewed by Dr. Kathrynn Running, will call with recommendations. Patient verbalized understanding.

## 2022-12-12 NOTE — Progress Notes (Signed)
RN spoke with patient to review PSA results from screening. Pt understands this result and understands it will be recheck with [facility] on 01/06/23.   RN provided education on post treatment PSA monitoring.   Pt will concerns with urinary frequency, hematuria, and dysuria that has progressively worsened over the past week.  Denies any fever.  RN encouraged patient to reach out to urology for additional recommendations to r/o infection, verbalized understanding and agreement.

## 2023-06-21 ENCOUNTER — Other Ambulatory Visit: Payer: Self-pay | Admitting: Medical Genetics

## 2023-06-22 ENCOUNTER — Other Ambulatory Visit (HOSPITAL_COMMUNITY)
Admission: RE | Admit: 2023-06-22 | Discharge: 2023-06-22 | Disposition: A | Discharge status: Home / Self Care | Payer: Self-pay | Source: Ambulatory Visit | Attending: Medical Genetics | Admitting: Medical Genetics

## 2023-07-02 LAB — GENECONNECT MOLECULAR SCREEN: Genetic Analysis Overall Interpretation: NEGATIVE
